# Patient Record
Sex: Female | Born: 1985 | Race: Black or African American | Hispanic: No | Marital: Single | Smoking: Never smoker
Health system: Southern US, Community
[De-identification: ages and names within clinical notes are randomized; demographics above are authoritative.]

## PROBLEM LIST (undated history)

## (undated) ENCOUNTER — Inpatient Hospital Stay (HOSPITAL_COMMUNITY): Payer: Self-pay

## (undated) DIAGNOSIS — N390 Urinary tract infection, site not specified: Secondary | ICD-10-CM

## (undated) DIAGNOSIS — N200 Calculus of kidney: Secondary | ICD-10-CM

## (undated) DIAGNOSIS — K7200 Acute and subacute hepatic failure without coma: Secondary | ICD-10-CM

## (undated) DIAGNOSIS — F419 Anxiety disorder, unspecified: Secondary | ICD-10-CM

## (undated) DIAGNOSIS — C9100 Acute lymphoblastic leukemia not having achieved remission: Secondary | ICD-10-CM

## (undated) DIAGNOSIS — B999 Unspecified infectious disease: Secondary | ICD-10-CM

## (undated) DIAGNOSIS — N83209 Unspecified ovarian cyst, unspecified side: Secondary | ICD-10-CM

## (undated) DIAGNOSIS — J189 Pneumonia, unspecified organism: Secondary | ICD-10-CM

## (undated) DIAGNOSIS — D649 Anemia, unspecified: Secondary | ICD-10-CM

## (undated) DIAGNOSIS — C9590 Leukemia, unspecified not having achieved remission: Secondary | ICD-10-CM

## (undated) DIAGNOSIS — C9200 Acute myeloblastic leukemia, not having achieved remission: Secondary | ICD-10-CM

## (undated) HISTORY — DX: Anemia, unspecified: D64.9

## (undated) HISTORY — PX: CHOLECYSTECTOMY: SHX55

## (undated) HISTORY — DX: Unspecified infectious disease: B99.9

## (undated) HISTORY — PX: OTHER SURGICAL HISTORY: SHX169

---
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
## 2005-05-05 ENCOUNTER — Emergency Department (HOSPITAL_COMMUNITY): Admission: EM | Admit: 2005-05-05 | Discharge: 2005-05-05 | Payer: Self-pay | Admitting: Emergency Medicine

## 2005-06-12 ENCOUNTER — Inpatient Hospital Stay (HOSPITAL_COMMUNITY): Admission: AD | Admit: 2005-06-12 | Discharge: 2005-06-12 | Payer: Self-pay | Admitting: Family Medicine

## 2005-06-26 DIAGNOSIS — F419 Anxiety disorder, unspecified: Secondary | ICD-10-CM

## 2005-06-26 HISTORY — DX: Anxiety disorder, unspecified: F41.9

## 2005-07-13 ENCOUNTER — Inpatient Hospital Stay (HOSPITAL_COMMUNITY): Admission: AD | Admit: 2005-07-13 | Discharge: 2005-07-13 | Payer: Self-pay | Admitting: Family Medicine

## 2005-07-15 ENCOUNTER — Inpatient Hospital Stay (HOSPITAL_COMMUNITY): Admission: AD | Admit: 2005-07-15 | Discharge: 2005-07-19 | Payer: Self-pay | Admitting: Obstetrics and Gynecology

## 2005-09-26 ENCOUNTER — Inpatient Hospital Stay (HOSPITAL_COMMUNITY): Admission: AD | Admit: 2005-09-26 | Discharge: 2005-09-26 | Payer: Self-pay | Admitting: Obstetrics

## 2005-10-08 ENCOUNTER — Inpatient Hospital Stay (HOSPITAL_COMMUNITY): Admission: AD | Admit: 2005-10-08 | Discharge: 2005-10-11 | Payer: Self-pay | Admitting: Obstetrics

## 2005-11-08 ENCOUNTER — Inpatient Hospital Stay (HOSPITAL_COMMUNITY): Admission: AD | Admit: 2005-11-08 | Discharge: 2005-11-10 | Payer: Self-pay | Admitting: Obstetrics

## 2005-11-11 ENCOUNTER — Inpatient Hospital Stay (HOSPITAL_COMMUNITY): Admission: AD | Admit: 2005-11-11 | Discharge: 2005-11-14 | Payer: Self-pay | Admitting: Obstetrics

## 2005-12-04 ENCOUNTER — Ambulatory Visit (HOSPITAL_COMMUNITY): Admission: RE | Admit: 2005-12-04 | Discharge: 2005-12-04 | Payer: Self-pay | Admitting: Obstetrics

## 2005-12-11 ENCOUNTER — Inpatient Hospital Stay (HOSPITAL_COMMUNITY): Admission: AD | Admit: 2005-12-11 | Discharge: 2005-12-14 | Payer: Self-pay | Admitting: Obstetrics

## 2005-12-11 ENCOUNTER — Inpatient Hospital Stay (HOSPITAL_COMMUNITY): Admission: AD | Admit: 2005-12-11 | Discharge: 2005-12-11 | Payer: Self-pay | Admitting: Obstetrics

## 2007-01-09 ENCOUNTER — Emergency Department (HOSPITAL_COMMUNITY): Admission: EM | Admit: 2007-01-09 | Discharge: 2007-01-09 | Payer: Self-pay | Admitting: Emergency Medicine

## 2007-02-23 ENCOUNTER — Inpatient Hospital Stay (HOSPITAL_COMMUNITY): Admission: AD | Admit: 2007-02-23 | Discharge: 2007-02-23 | Payer: Self-pay | Admitting: Obstetrics

## 2007-02-27 ENCOUNTER — Inpatient Hospital Stay (HOSPITAL_COMMUNITY): Admission: AD | Admit: 2007-02-27 | Discharge: 2007-02-27 | Payer: Self-pay | Admitting: Obstetrics

## 2007-03-02 ENCOUNTER — Inpatient Hospital Stay (HOSPITAL_COMMUNITY): Admission: AD | Admit: 2007-03-02 | Discharge: 2007-03-06 | Payer: Self-pay | Admitting: Obstetrics

## 2007-03-25 ENCOUNTER — Inpatient Hospital Stay (HOSPITAL_COMMUNITY): Admission: AD | Admit: 2007-03-25 | Discharge: 2007-03-25 | Payer: Self-pay | Admitting: Obstetrics

## 2007-05-26 ENCOUNTER — Inpatient Hospital Stay (HOSPITAL_COMMUNITY): Admission: AD | Admit: 2007-05-26 | Discharge: 2007-06-03 | Payer: Self-pay | Admitting: Obstetrics

## 2007-06-28 ENCOUNTER — Inpatient Hospital Stay (HOSPITAL_COMMUNITY): Admission: AD | Admit: 2007-06-28 | Discharge: 2007-06-28 | Payer: Self-pay | Admitting: Obstetrics

## 2007-06-30 ENCOUNTER — Inpatient Hospital Stay (HOSPITAL_COMMUNITY): Admission: AD | Admit: 2007-06-30 | Discharge: 2007-07-05 | Payer: Self-pay | Admitting: Obstetrics

## 2007-08-25 ENCOUNTER — Inpatient Hospital Stay (HOSPITAL_COMMUNITY): Admission: AD | Admit: 2007-08-25 | Discharge: 2007-08-25 | Payer: Self-pay | Admitting: Obstetrics

## 2007-08-30 ENCOUNTER — Inpatient Hospital Stay (HOSPITAL_COMMUNITY): Admission: AD | Admit: 2007-08-30 | Discharge: 2007-09-02 | Payer: Self-pay | Admitting: Obstetrics

## 2008-02-21 ENCOUNTER — Emergency Department (HOSPITAL_COMMUNITY): Admission: EM | Admit: 2008-02-21 | Discharge: 2008-02-21 | Payer: Self-pay | Admitting: Emergency Medicine

## 2008-05-12 ENCOUNTER — Emergency Department (HOSPITAL_COMMUNITY): Admission: EM | Admit: 2008-05-12 | Discharge: 2008-05-12 | Payer: Self-pay | Admitting: Emergency Medicine

## 2008-10-20 ENCOUNTER — Emergency Department (HOSPITAL_COMMUNITY): Admission: EM | Admit: 2008-10-20 | Discharge: 2008-10-20 | Payer: Self-pay | Admitting: Emergency Medicine

## 2009-01-28 ENCOUNTER — Emergency Department (HOSPITAL_COMMUNITY): Admission: EM | Admit: 2009-01-28 | Discharge: 2009-01-28 | Payer: Self-pay | Admitting: Emergency Medicine

## 2009-09-28 ENCOUNTER — Inpatient Hospital Stay (HOSPITAL_COMMUNITY): Admission: AD | Admit: 2009-09-28 | Discharge: 2009-09-28 | Payer: Self-pay | Admitting: Obstetrics & Gynecology

## 2009-10-21 ENCOUNTER — Inpatient Hospital Stay (HOSPITAL_COMMUNITY): Admission: AD | Admit: 2009-10-21 | Discharge: 2009-10-22 | Payer: Self-pay | Admitting: Obstetrics

## 2009-10-24 ENCOUNTER — Emergency Department (HOSPITAL_COMMUNITY): Admission: EM | Admit: 2009-10-24 | Discharge: 2009-10-24 | Payer: Self-pay | Admitting: Emergency Medicine

## 2009-12-17 ENCOUNTER — Inpatient Hospital Stay (HOSPITAL_COMMUNITY): Admission: AD | Admit: 2009-12-17 | Discharge: 2009-12-20 | Payer: Self-pay | Admitting: Obstetrics & Gynecology

## 2009-12-17 ENCOUNTER — Ambulatory Visit: Payer: Self-pay | Admitting: Obstetrics and Gynecology

## 2009-12-26 ENCOUNTER — Inpatient Hospital Stay (HOSPITAL_COMMUNITY): Admission: AD | Admit: 2009-12-26 | Discharge: 2009-12-26 | Payer: Self-pay | Admitting: Obstetrics

## 2009-12-26 ENCOUNTER — Ambulatory Visit: Payer: Self-pay | Admitting: Nurse Practitioner

## 2010-02-13 ENCOUNTER — Emergency Department (HOSPITAL_COMMUNITY): Admission: EM | Admit: 2010-02-13 | Discharge: 2010-02-13 | Payer: Self-pay | Admitting: Family Medicine

## 2010-04-11 ENCOUNTER — Inpatient Hospital Stay (HOSPITAL_COMMUNITY): Admission: AD | Admit: 2010-04-11 | Discharge: 2010-04-11 | Payer: Self-pay | Admitting: Obstetrics and Gynecology

## 2010-04-11 ENCOUNTER — Ambulatory Visit: Payer: Self-pay | Admitting: Nurse Practitioner

## 2010-06-19 ENCOUNTER — Inpatient Hospital Stay (HOSPITAL_COMMUNITY)
Admission: AD | Admit: 2010-06-19 | Discharge: 2010-06-19 | Payer: Self-pay | Source: Home / Self Care | Attending: Obstetrics | Admitting: Obstetrics

## 2010-06-26 HISTORY — PX: OTHER SURGICAL HISTORY: SHX169

## 2010-07-05 ENCOUNTER — Inpatient Hospital Stay (HOSPITAL_COMMUNITY)
Admission: AD | Admit: 2010-07-05 | Discharge: 2010-07-09 | Payer: Self-pay | Source: Home / Self Care | Attending: Obstetrics | Admitting: Obstetrics

## 2010-07-11 LAB — CBC
HCT: 26.5 % — ABNORMAL LOW (ref 36.0–46.0)
HCT: 24.9 % — ABNORMAL LOW (ref 36.0–46.0)
Hemoglobin: 8.5 g/dL — ABNORMAL LOW (ref 12.0–15.0)
Hemoglobin: 8 g/dL — ABNORMAL LOW (ref 12.0–15.0)
MCH: 24 pg — ABNORMAL LOW (ref 26.0–34.0)
MCH: 24.1 pg — ABNORMAL LOW (ref 26.0–34.0)
MCHC: 32.1 g/dL (ref 30.0–36.0)
MCV: 74.9 fL — ABNORMAL LOW (ref 78.0–100.0)
MCV: 75 fL — ABNORMAL LOW (ref 78.0–100.0)
Platelets: 437 10*3/uL — ABNORMAL HIGH (ref 150–400)
Platelets: 447 10*3/uL — ABNORMAL HIGH (ref 150–400)
RBC: 3.54 MIL/uL — ABNORMAL LOW (ref 3.87–5.11)
RDW: 18.7 % — ABNORMAL HIGH (ref 11.5–15.5)
RDW: 18.7 % — ABNORMAL HIGH (ref 11.5–15.5)
WBC: 9.3 10*3/uL (ref 4.0–10.5)
WBC: 9.3 10*3/uL (ref 4.0–10.5)

## 2010-07-11 LAB — DIFFERENTIAL
Basophils Absolute: 0 10*3/uL (ref 0.0–0.1)
Basophils Absolute: 0 10*3/uL (ref 0.0–0.1)
Basophils Relative: 0 % (ref 0–1)
Basophils Relative: 0 % (ref 0–1)
Eosinophils Absolute: 0 10*3/uL (ref 0.0–0.7)
Eosinophils Absolute: 0 10*3/uL (ref 0.0–0.7)
Eosinophils Relative: 0 % (ref 0–5)
Eosinophils Relative: 0 % (ref 0–5)
Lymphocytes Relative: 10 % — ABNORMAL LOW (ref 12–46)
Lymphocytes Relative: 7 % — ABNORMAL LOW (ref 12–46)
Lymphs Abs: 0.9 10*3/uL (ref 0.7–4.0)
Lymphs Abs: 0.7 10*3/uL (ref 0.7–4.0)
Monocytes Absolute: 0.3 10*3/uL (ref 0.1–1.0)
Monocytes Absolute: 0 10*3/uL — ABNORMAL LOW (ref 0.1–1.0)
Monocytes Relative: 4 % (ref 3–12)
Monocytes Relative: 0 % — ABNORMAL LOW (ref 3–12)
Neutro Abs: 8.1 10*3/uL — ABNORMAL HIGH (ref 1.7–7.7)
Neutro Abs: 8.5 10*3/uL — ABNORMAL HIGH (ref 1.7–7.7)
Neutrophils Relative %: 86 % — ABNORMAL HIGH (ref 43–77)
Neutrophils Relative %: 92 % — ABNORMAL HIGH (ref 43–77)

## 2010-07-11 LAB — RUBELLA SCREEN: Rubella: 34.1 IU/mL — ABNORMAL HIGH

## 2010-07-11 LAB — ELECTROLYTE PANEL
CO2: 23 mEq/L (ref 19–32)
Chloride: 106 mEq/L (ref 96–112)
Potassium: 3.4 mEq/L — ABNORMAL LOW (ref 3.5–5.1)
Sodium: 134 mEq/L — ABNORMAL LOW (ref 135–145)

## 2010-07-11 LAB — RUBELLA ANTIBODY, IGM: Rubella IgM: 0.63 Ratio (ref <0.90)

## 2010-07-11 LAB — RPR
RPR Ser Ql: NONREACTIVE
RPR Ser Ql: NONREACTIVE

## 2010-07-11 LAB — ABO/RH: ABO/RH(D): A POS

## 2010-07-11 LAB — GLUCOSE, CAPILLARY: Glucose-Capillary: 86 mg/dL (ref 70–99)

## 2010-07-11 LAB — SICKLE CELL SCREEN: Sickle Cell Screen: NEGATIVE

## 2010-07-11 LAB — HEPATITIS B SURFACE ANTIBODY,QUALITATIVE: Hep B S Ab: POSITIVE — AB

## 2010-07-11 LAB — HIV ANTIBODY (ROUTINE TESTING W REFLEX): HIV: NONREACTIVE

## 2010-07-13 LAB — GLUCOSE, CAPILLARY
Glucose-Capillary: 105 mg/dL — ABNORMAL HIGH (ref 70–99)
Glucose-Capillary: 117 mg/dL — ABNORMAL HIGH (ref 70–99)

## 2010-07-17 ENCOUNTER — Encounter: Payer: Self-pay | Admitting: Obstetrics

## 2010-07-18 ENCOUNTER — Inpatient Hospital Stay (HOSPITAL_COMMUNITY)
Admission: AD | Admit: 2010-07-18 | Discharge: 2010-07-18 | Payer: Self-pay | Source: Home / Self Care | Attending: Obstetrics | Admitting: Obstetrics

## 2010-09-05 LAB — URINALYSIS, ROUTINE W REFLEX MICROSCOPIC
Nitrite: NEGATIVE
Specific Gravity, Urine: 1.02 (ref 1.005–1.030)
Urobilinogen, UA: 1 mg/dL (ref 0.0–1.0)
pH: 7.5 (ref 5.0–8.0)

## 2010-09-05 LAB — URINE MICROSCOPIC-ADD ON

## 2010-09-07 LAB — URINE CULTURE: Culture  Setup Time: 201110171953

## 2010-09-07 LAB — URINE MICROSCOPIC-ADD ON

## 2010-09-07 LAB — CBC
HCT: 24 % — ABNORMAL LOW (ref 36.0–46.0)
Hemoglobin: 7.6 g/dL — ABNORMAL LOW (ref 12.0–15.0)
RBC: 3.27 MIL/uL — ABNORMAL LOW (ref 3.87–5.11)
WBC: 5.1 10*3/uL (ref 4.0–10.5)

## 2010-09-07 LAB — ABO/RH: ABO/RH(D): A POS

## 2010-09-07 LAB — URINALYSIS, ROUTINE W REFLEX MICROSCOPIC
Bilirubin Urine: NEGATIVE
Hgb urine dipstick: NEGATIVE
Ketones, ur: NEGATIVE mg/dL
Protein, ur: NEGATIVE mg/dL
Urobilinogen, UA: 1 mg/dL (ref 0.0–1.0)

## 2010-09-07 LAB — WET PREP, GENITAL: Trich, Wet Prep: NONE SEEN

## 2010-09-07 LAB — GC/CHLAMYDIA PROBE AMP, GENITAL: Chlamydia, DNA Probe: NEGATIVE

## 2010-09-08 LAB — POCT URINALYSIS DIPSTICK
Ketones, ur: NEGATIVE mg/dL
Protein, ur: NEGATIVE mg/dL
Specific Gravity, Urine: 1.025 (ref 1.005–1.030)
Urobilinogen, UA: 0.2 mg/dL (ref 0.0–1.0)

## 2010-09-11 LAB — CBC
HCT: 28.5 % — ABNORMAL LOW (ref 36.0–46.0)
Hemoglobin: 9.4 g/dL — ABNORMAL LOW (ref 12.0–15.0)
Hemoglobin: 9.4 g/dL — ABNORMAL LOW (ref 12.0–15.0)
MCH: 25.9 pg — ABNORMAL LOW (ref 26.0–34.0)
MCHC: 32.9 g/dL (ref 30.0–36.0)
MCV: 78.9 fL (ref 78.0–100.0)
MCV: 78.9 fL (ref 78.0–100.0)
RBC: 3.65 MIL/uL — ABNORMAL LOW (ref 3.87–5.11)
RDW: 18.5 % — ABNORMAL HIGH (ref 11.5–15.5)
WBC: 12.8 10*3/uL — ABNORMAL HIGH (ref 4.0–10.5)
WBC: 11.3 10*3/uL — ABNORMAL HIGH (ref 4.0–10.5)

## 2010-09-11 LAB — COMPREHENSIVE METABOLIC PANEL
ALT: 11 U/L (ref 0–35)
ALT: 35 U/L (ref 0–35)
Alkaline Phosphatase: 51 U/L (ref 39–117)
BUN: 33 mg/dL — ABNORMAL HIGH (ref 6–23)
BUN: 5 mg/dL — ABNORMAL LOW (ref 6–23)
CO2: 22 mEq/L (ref 19–32)
CO2: 22 mEq/L (ref 19–32)
Calcium: 8.4 mg/dL (ref 8.4–10.5)
Creatinine, Ser: 0.48 mg/dL (ref 0.4–1.2)
GFR calc non Af Amer: 44 mL/min — ABNORMAL LOW (ref >60)
GFR calc non Af Amer: >60 mL/min (ref >60)
Glucose, Bld: 129 mg/dL — ABNORMAL HIGH (ref 70–99)
Glucose, Bld: 84 mg/dL (ref 70–99)
Potassium: 5.1 mEq/L (ref 3.5–5.1)
Sodium: 140 mEq/L (ref 135–145)
Total Protein: 6.6 g/dL (ref 6.0–8.3)
Total Protein: 9.3 g/dL — ABNORMAL HIGH (ref 6.0–8.3)

## 2010-09-11 LAB — URINE MICROSCOPIC-ADD ON

## 2010-09-11 LAB — URINALYSIS, ROUTINE W REFLEX MICROSCOPIC
Glucose, UA: NEGATIVE mg/dL
Ketones, ur: >80 mg/dL — AB
Leukocytes, UA: NEGATIVE
Nitrite: NEGATIVE
Protein, ur: 30 mg/dL — AB
Urobilinogen, UA: 0.2 mg/dL (ref 0.0–1.0)

## 2010-09-13 LAB — URINALYSIS, ROUTINE W REFLEX MICROSCOPIC
Glucose, UA: NEGATIVE mg/dL
Ketones, ur: >80 mg/dL — AB
Leukocytes, UA: NEGATIVE
Nitrite: NEGATIVE
Protein, ur: 30 mg/dL — AB
pH: 6 (ref 5.0–8.0)

## 2010-09-13 LAB — POCT I-STAT, CHEM 8
BUN: 14 mg/dL (ref 6–23)
Creatinine, Ser: 0.8 mg/dL (ref 0.4–1.2)
Potassium: 3.5 mEq/L (ref 3.5–5.1)
Sodium: 139 mEq/L (ref 135–145)
TCO2: 22 mmol/L (ref 0–100)

## 2010-09-13 LAB — COMPREHENSIVE METABOLIC PANEL
ALT: 21 U/L (ref 0–35)
AST: 26 U/L (ref 0–37)
Alkaline Phosphatase: 32 U/L — ABNORMAL LOW (ref 39–117)
CO2: 23 mEq/L (ref 19–32)
Chloride: 108 mEq/L (ref 96–112)
GFR calc Af Amer: >60 mL/min (ref >60)
GFR calc non Af Amer: >60 mL/min (ref >60)
Glucose, Bld: 95 mg/dL (ref 70–99)
Sodium: 138 mEq/L (ref 135–145)
Total Bilirubin: 0.6 mg/dL (ref 0.3–1.2)

## 2010-09-13 LAB — URINE MICROSCOPIC-ADD ON

## 2010-09-13 LAB — CBC
Hemoglobin: 8.3 g/dL — ABNORMAL LOW (ref 12.0–15.0)
RBC: 3.25 MIL/uL — ABNORMAL LOW (ref 3.87–5.11)
WBC: 6.9 10*3/uL (ref 4.0–10.5)

## 2010-09-13 LAB — WET PREP, GENITAL
Clue Cells Wet Prep HPF POC: NONE SEEN
Trich, Wet Prep: NONE SEEN

## 2010-09-13 LAB — PREGNANCY, URINE: Preg Test, Ur: NEGATIVE

## 2010-09-14 LAB — URINALYSIS, ROUTINE W REFLEX MICROSCOPIC
Bilirubin Urine: NEGATIVE
Hgb urine dipstick: NEGATIVE
Ketones, ur: 15 mg/dL — AB
Protein, ur: NEGATIVE mg/dL
Specific Gravity, Urine: 1.02 (ref 1.005–1.030)
Urobilinogen, UA: 0.2 mg/dL (ref 0.0–1.0)

## 2010-09-25 ENCOUNTER — Inpatient Hospital Stay (HOSPITAL_COMMUNITY)
Admission: AD | Admit: 2010-09-25 | Discharge: 2010-09-25 | Disposition: A | Discharge status: Home / Self Care | Payer: Medicaid Other | Source: Ambulatory Visit | Attending: Obstetrics & Gynecology | Admitting: Obstetrics & Gynecology

## 2010-09-25 DIAGNOSIS — R109 Unspecified abdominal pain: Secondary | ICD-10-CM | POA: Insufficient documentation

## 2010-09-25 DIAGNOSIS — D649 Anemia, unspecified: Secondary | ICD-10-CM | POA: Insufficient documentation

## 2010-09-25 DIAGNOSIS — O212 Late vomiting of pregnancy: Secondary | ICD-10-CM | POA: Insufficient documentation

## 2010-09-25 DIAGNOSIS — O99019 Anemia complicating pregnancy, unspecified trimester: Secondary | ICD-10-CM | POA: Insufficient documentation

## 2010-09-25 LAB — RAPID URINE DRUG SCREEN, HOSP PERFORMED
Benzodiazepines: NOT DETECTED
Cocaine: NOT DETECTED
Tetrahydrocannabinol: NOT DETECTED

## 2010-09-25 LAB — TYPE AND SCREEN

## 2010-09-25 LAB — URINE MICROSCOPIC-ADD ON

## 2010-09-25 LAB — URINALYSIS, ROUTINE W REFLEX MICROSCOPIC
Glucose, UA: NEGATIVE mg/dL
Protein, ur: NEGATIVE mg/dL
Specific Gravity, Urine: 1.025 (ref 1.005–1.030)
pH: 6 (ref 5.0–8.0)

## 2010-09-25 LAB — CBC
MCV: 76.8 fL — ABNORMAL LOW (ref 78.0–100.0)
Platelets: 211 10*3/uL (ref 150–400)
RDW: 16.7 % — ABNORMAL HIGH (ref 11.5–15.5)
WBC: 5 10*3/uL (ref 4.0–10.5)

## 2010-09-25 LAB — DIFFERENTIAL
Basophils Relative: 1 % (ref 0–1)
Eosinophils Absolute: 0 10*3/uL (ref 0.0–0.7)
Eosinophils Relative: 1 % (ref 0–5)
Lymphs Abs: 1 10*3/uL (ref 0.7–4.0)

## 2010-09-25 LAB — RUBELLA SCREEN: Rubella: 34.9 IU/mL — ABNORMAL HIGH

## 2010-09-26 LAB — RPR: RPR Ser Ql: NONREACTIVE

## 2010-10-19 ENCOUNTER — Other Ambulatory Visit: Payer: Self-pay | Admitting: Family Medicine

## 2010-10-19 ENCOUNTER — Other Ambulatory Visit: Payer: Self-pay | Admitting: Obstetrics & Gynecology

## 2010-10-19 DIAGNOSIS — O0930 Supervision of pregnancy with insufficient antenatal care, unspecified trimester: Secondary | ICD-10-CM

## 2010-10-19 DIAGNOSIS — Z331 Pregnant state, incidental: Secondary | ICD-10-CM

## 2010-10-19 DIAGNOSIS — Z3689 Encounter for other specified antenatal screening: Secondary | ICD-10-CM

## 2010-10-19 LAB — POCT URINALYSIS DIP (DEVICE)
Ketones, ur: NEGATIVE mg/dL
Protein, ur: NEGATIVE mg/dL
Specific Gravity, Urine: 1.015 (ref 1.005–1.030)
pH: 7 (ref 5.0–8.0)

## 2010-10-21 ENCOUNTER — Ambulatory Visit (HOSPITAL_COMMUNITY)
Admission: RE | Admit: 2010-10-21 | Discharge: 2010-10-21 | Disposition: A | Discharge status: Home / Self Care | Payer: Medicaid Other | Source: Ambulatory Visit | Attending: Obstetrics & Gynecology | Admitting: Obstetrics & Gynecology

## 2010-10-21 DIAGNOSIS — O0930 Supervision of pregnancy with insufficient antenatal care, unspecified trimester: Secondary | ICD-10-CM

## 2010-10-21 DIAGNOSIS — Z3689 Encounter for other specified antenatal screening: Secondary | ICD-10-CM

## 2010-10-30 ENCOUNTER — Inpatient Hospital Stay (HOSPITAL_COMMUNITY)
Admission: AD | Admit: 2010-10-30 | Discharge: 2010-10-30 | Disposition: A | Discharge status: Home / Self Care | Payer: Medicaid Other | Source: Ambulatory Visit | Attending: Obstetrics & Gynecology | Admitting: Obstetrics & Gynecology

## 2010-10-30 DIAGNOSIS — O4700 False labor before 37 completed weeks of gestation, unspecified trimester: Secondary | ICD-10-CM

## 2010-10-30 DIAGNOSIS — R109 Unspecified abdominal pain: Secondary | ICD-10-CM | POA: Insufficient documentation

## 2010-10-30 LAB — URINALYSIS, ROUTINE W REFLEX MICROSCOPIC
Glucose, UA: NEGATIVE mg/dL
Ketones, ur: 15 mg/dL — AB
pH: 7 (ref 5.0–8.0)

## 2010-11-02 ENCOUNTER — Other Ambulatory Visit: Payer: Self-pay | Admitting: Obstetrics & Gynecology

## 2010-11-02 DIAGNOSIS — O0930 Supervision of pregnancy with insufficient antenatal care, unspecified trimester: Secondary | ICD-10-CM

## 2010-11-02 DIAGNOSIS — Z331 Pregnant state, incidental: Secondary | ICD-10-CM

## 2010-11-02 DIAGNOSIS — O88019 Air embolism in pregnancy, unspecified trimester: Secondary | ICD-10-CM

## 2010-11-02 DIAGNOSIS — O99019 Anemia complicating pregnancy, unspecified trimester: Secondary | ICD-10-CM

## 2010-11-02 LAB — POCT URINALYSIS DIP (DEVICE)
Protein, ur: NEGATIVE mg/dL
Specific Gravity, Urine: 1.02 (ref 1.005–1.030)
pH: 7 (ref 5.0–8.0)

## 2010-11-08 NOTE — H&P (Signed)
NAMEDORRI, OZTURK NO.:  [id number]   MEDICAL RECORD NO.:  [id number]          PATIENT TYPE:  INP   LOCATION:  9153                          [facility]ACILITY:  [facility]   PHYSICIAN:  Charles A. Clearance Coots, M.D.DATE OF BIRTH:  1986-04-09   DATE OF ADMISSION:  05/26/2007  DATE OF DISCHARGE:                              HISTORY & PHYSICAL   HISTORY AND PHYSICAL:  A 25 year old G2, P74, female presented to [facility] at [redacted] weeks gestation with nausea, vomiting, and inability to  keep any thing down.  She denied fever, chills, dysuria, diarrhea, or  uterine contractions.   PAST MEDICAL HISTORY:   SURGERY:  None.   ILLNESSES:  None.   MEDICATIONS:  None.   ALLERGIES:  No known drug allergies.   SOCIAL HISTORY:  Single.  Negative tobacco, alcohol, or recreational  drug use.   PHYSICAL EXAMINATION:  GENERAL:  Well-nourished, well-developed female  in no acute distress.  VITAL SIGNS:  Temperature 97, blood pressure 114/59.  LUNGS:  Clear to auscultation bilaterally.  HEART:  Regular rate and rhythm.  ABDOMEN:  Nontender.  PELVIC:  Exam was omitted.   IMPRESSION:  1. At [redacted] weeks gestation.  2. Probable viral gastroenteritis.   PLAN:  1. Admit.  2. Start IV fluid hydration.  3. Supportive management.      Charles A. Clearance Coots, M.D.  Electronically Signed    CAH/MEDQ  D:  05/26/2007  T:  05/26/2007  Job:  [id number]

## 2010-11-08 NOTE — H&P (Signed)
Carla, Haas NO.:  [id number]   MEDICAL RECORD NO.:  [id number]          PATIENT TYPE:  INP   LOCATION:  9121                          [facility]  [facility]   PHYSICIAN:  Roseanna Rainbow, M.D.DATE OF BIRTH:  Dec 14, 1985   DATE OF ADMISSION:  08/30/2007  DATE OF DISCHARGE:                              HISTORY & PHYSICAL   CHIEF COMPLAINT:  The patient is a 25 year old, para 1, with an  intrauterine pregnancy at 39+ weeks, complaining of uterine  contractions.   HISTORY OF PRESENT ILLNESS:  Please see the above.  Denies ruptured  membranes.   PAST GYNECOLOGIC HISTORY:  Noncontributory.   MEDICATIONS:  None.   ALLERGIES:  No known drug allergies.   PAST OBSTETRICAL HISTORY:  1. In 2007, she was delivered of a liveborn female, 5 pounds, 3      ounces, by spontaneous vaginal delivery.  2. Hyperemesis gravidarum early in the pregnancy.  3. Suspected CBM.  4. Positive gonorrhea __________ .  5. GBS positive.  6. Urinary tract infection.   __________ presently intrauterine growth retardation.   PAST SURGICAL HISTORY:  Cholecystectomy.   PAST MEDICAL HISTORY:  1. Anxiety disorder.  2. Urinary tract infections.   PRENATAL LABORATORY DATA:  Hemoglobin 8.1, hematocrit 25.3, platelets  283,000, blood type A-positive, antibody screen negative, sickle cell  negative, RPR nonreactive, Rubella immune, hepatitis B surface antigen  negative, PPD negative, GC probe positive, Chlamydia negative, cystic  fibrosis assay negative, GBS positive, repeat Chlamydia probe negative,  HIV nonreactive.   PHYSICAL EXAMINATION:  Vital signs stable, afebrile.  Fetal heart rate  tracing per tocodynamometer, the uterine contractions every two to four  minutes.  A sterile vaginal exam per the RN, the cervix is 4 cm dilated  and 80% effaced.   ASSESSMENT:  1. Primipara at term with possible intrauterine growth retardation.  2. Group B Streptococcus positive.  3. Latent,  not necessarily in active labor.  4. Fetal heart tracing is __________ well-being.   PLAN:  Admit.  Expected management for now.      Roseanna Rainbow, M.D.     Carla Haas  D:  08/31/2007  T:  08/31/2007  Job:  [id number]

## 2010-11-08 NOTE — Consult Note (Signed)
NAME:  Carla Haas, Carla Haas NO.:  [id number]   MEDICAL RECORD NO.:  [id number]          PATIENT TYPE:  INP   LOCATION:  9153                          [facility]  [facility]   PHYSICIAN:  Graylin Shiver, M.D.   DATE OF BIRTH:  09-24-1985   DATE OF CONSULTATION:  05/31/2007  DATE OF DISCHARGE:                                 CONSULTATION   REASON FOR CONSULTATION:  The patient is a 25 year old black female who  is [redacted] weeks pregnant.  She has been experiencing nausea and vomiting for  the past week and a half.  Actually today she has tolerated some food  and is feeling better according to her and also the nurse reports that  she looks and feels a lot better than when she saw her in the past.  The  patient has not been experiencing any abdominal pain.   She is status post cholecystectomy in 2003 for gallstones.  On May 29, 2007, her amylase was 173, upper limit of normal 131.  Her lipase was  normal and her LFTs were normal.   She has been treated this hospitalization with Zofran and today is  feeling much, much better.   PHYSICAL EXAMINATION:  She is in no distress.  Vital signs are stable.  She is alert.  No distress.  Heart regular rhythm.  No murmurs.  Lungs  are clear.  Abdomen is soft.  She is pregnant.  It is not tender.   IMPRESSION:  1. Nausea and vomiting.  I wonder if this might be a viral syndrome.      I would also have to consider hyperemesis but she seems to be      better today.  2. Elevated amylase with a normal lipase and no abdominal pain.  This      does not seem to be suggestive of pancreatitis.   RECOMMENDATIONS:  I would recommend rechecking an amylase and lipase,  continuing supportive care with IV fluids, Zofran and TNA if necessary.  Will also check an abdominal ultrasound to look for any radiological  evidence of pancreatitis or dilated biliary tree.           ______________________________  Graylin Shiver, M.D.     SFG/MEDQ  D:   05/31/2007  T:  06/01/2007  Job:  [id number]

## 2010-11-08 NOTE — Consult Note (Signed)
NAMELAQUASIA, PINCUS NO.:  [id number]   MEDICAL RECORD NO.:  [id number]          PATIENT TYPE:  INP   LOCATION:  9153                          [facility]   PHYSICIAN:  Antonietta Breach, M.D.  DATE OF BIRTH:  06-05-1986   DATE OF CONSULTATION:  05/29/2007  DATE OF DISCHARGE:                                 CONSULTATION   REASON FOR CONSULTATION:  Rule out psychosis.  Rule out depression.   HISTORY OF PRESENT ILLNESS:  Ms. Gambrell is a 25 year old female  admitted to the [facility] [place] on May 26, 2007 with  non remitting nausea and vomiting.   The patient has two days of developing a posture in her bed which  involves the fetal position covering up with covers and not talking.  As  the day has proceeded today, the patient has begun to answer questions  with the current nurse on duty.  She also has been able to keep small  amounts of food down.  However, the severe nausea continues.  The  patient has continued with poverty of speech.  She has not verbalized  any thoughts of harming herself or others.  She has not given any  evidence that she is having delusions.  She has not accused anyone of  poisoning her or trying to harm her.   The patient has been requiring 0.5 mg of Xanax three times a day to  reduce feeling on edge and muscle tension.  This appears to be working  well.   At the time of the undersigned's visit, the patient describes normal  future constructive goals and interests.  She has hope.  She denies  depressed mood.  She denies thoughts of harming herself or others.  She  is not having any hallucinations or delusions.  Please see the mental  status exam.   The patient states that she has utilized this posturing to help her deal  with the discomfort.  When she goes into the state of withdrawal, she is  fully aware and oriented.  She has intact memory.  The state of  withdrawing inward helps her to perceive the discomfort at  a lower  level.   PAST PSYCHIATRIC HISTORY:  Ms. Budreau denies any history of major  depression or hallucinations.  She denies any psychiatric care.  She  denies any history of suicidal thoughts.   She states that she utilized this method of trance-like posturing to  deal with her previous hyperemesis of her first pregnancy.   FAMILY PSYCHIATRIC HISTORY:  None known.   SOCIAL HISTORY:  Ms. Alicia does have a toddler from a previous  pregnancy.  The father of the current baby is living with Ms. Noguchi.  They are a mutually supportive couple.  There is no abuse.  Her other  child lives with them.  The patient denies any history of alcohol or  illegal drug use.   PAST MEDICAL HISTORY:  Hyperemesis gravidarum.   ALLERGIES:  No known drug allergies.   MEDICATIONS:  The MAR is reviewed.  The patient's psychotropics include  Xanax 0.5 mg t.i.d.  and Ambien 10 mg every evening p.r.n. insomnia.   LABORATORY DATA:  Sodium 132, BUN 8, creatinine 0.58, calcium 8.4, SGOT  27, SGPT 21.  Wet prep negative for Trichomonas and negative for clue  cells.  WBC 13.4, hemoglobin 10.8, platelet count 384.   REVIEW OF SYSTEMS:  CONSTITUTIONAL:  Afebrile.  HEAD:  No trauma.  EYES:  No visual changes.  EARS:  No hearing impairment.  NOSE:  No rhinorrhea.  MOUTH/THROAT:  No sore throat.  NEUROLOGIC:  No focal motor or sensory  deficit.  PSYCHIATRIC:  As above.  CARDIOVASCULAR:  No chest pain or  palpitations.  RESPIRATORY:  No coughing or wheezing.  GASTROINTESTINAL:  As above.  GENITOURINARY:  No dysuria.  SKIN:  Unremarkable.  ENDOCRINE/METABOLIC:  No heat or cold intolerance.  MUSCULOSKELETAL:  No  deformities.  HEMATOLOGIC/LYMPHATIC:  Anemia.   PHYSICAL EXAMINATION:  VITAL SIGNS:  Temperature 98.9, pulse 85,  respiratory rate 20, blood pressure 99/63.  GENERAL APPEARANCE:  Ms. Pincock is a young female appearing her  chronologic age initially lying in a fetal position in the left lateral   decubitus position and eventually stretching out at times all of her  extremities.  She does not have any abnormal involuntary movements.   MENTAL STATUS EXAM:  Ms. Amesquita during the initial part of the  interview particularly does not have eye contact.  She began to have  some eye contact, but would go back to closing her eyes with her head  down in the bed intermittently throughout the interview.  She is  attentive to the conversation clearly responding quickly to questions.  As the interview progresses, the patient makes spontaneous statements  and questions.  She is oriented completely to all spheres.  Her memory  is intact to immediate, recent and remote.  Her fund of knowledge and  intelligence are within normal limits.  Her affect is slightly anxious.  Her mood is slightly anxious.  Her speech involves normal rate and  prosody without dysarthria.  Her speech volume is soft.  Initially she  has poverty of speech, but as the interview progresses, see above.  Thought process logical, coherent, goal-directed.  No looseness of  associations.  Thought content:  No thoughts of harming herself, no  thoughts of harming others, no delusions, no hallucinations.  Language  expression and comprehension are intact.  Abstraction intact.  Insight  is intact.  Judgment within normal limits.   ASSESSMENT:  AXIS I:  293.84 anxiety disorder not otherwise specified.  AXIS II:  Deferred.  AXIS III:  See general medical section.  AXIS IV:  General medical.  AXIS V:  55.   Ms. Zartman is not at risk to harm herself or others.  She agrees to  call emergency services immediately for any thoughts of harming herself,  thoughts of harming others or distress.   The undersigned provided ego supportive psychotherapy and education  discussing the signs and symptoms of depression.  The patient agrees to  tell her doctor if she develops these symptoms.   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  The patient is completely aware and  alert as well as oriented.  She is utilizing this posture to reduce her  perception and cope with the discomfort.   RECOMMENDATIONS:  1. If the patient continues to require Xanax, would refer her to one  of the psychiatric clinics attached to [facility], [facility] or [facility].  Also if she      requires Xanax outside of this episode of general medical stress,      she would be a candidate for cognitive behavioral therapy combined      with progressive muscle relaxation and deep breathing as well as      possibly other pharmacotherapy.      Antonietta Breach, M.D.  Electronically Signed     JW/MEDQ  D:  05/29/2007  T:  05/30/2007  Job:  [id number]

## 2010-11-08 NOTE — Discharge Summary (Signed)
Carla Haas, STRATTON NO.:  [id number]   MEDICAL RECORD NO.:  [id number]          PATIENT TYPE:  INP   LOCATION:  9153                          [facility]   PHYSICIAN:  Kathreen Cosier, M.D.DATE OF BIRTH:  May 30, 1986   DATE OF ADMISSION:  05/26/2007  DATE OF DISCHARGE:  06/03/2007                               DISCHARGE SUMMARY   The patient is a 25 year old gravida 2, para 1-0-0-1, EDC September 02, 2007,  and she was admitted with persistent nausea, vomiting.  The patient also  complained of anxiety.  The patient had developed problems with her  previous pregnancy, about the same time.  She was started on Xanax 8.5  t.i.d.  Her potassium was 2.4, and she was placed on 40 mEq of KCl  IV.  Psychiatric consult was also obtained, and a GI consult was obtained.  Her SMA 24 was normal.  Amylase, lipase, thyroid function was  all  normal, and by December 4 she was more alert, and she had been seen by  the perinatologist and the psychiatrist.  She was started on Ensure 4  times a day, and the patient started eating spontaneously, and by  December 8, she had had no vomiting for 2 days, and she was discharged  home on Zofran 4 mg p.o. q.6 h., to be followed at the outpatient mental  health department and to see me in 2 weeks.   DISCHARGE DIAGNOSIS:  Status post nausea, vomiting at 25 weeks.           ______________________________  Kathreen Cosier, M.D.     BAM/MEDQ  D:  06/26/2007  T:  06/26/2007  Job:  [id number]

## 2010-11-09 ENCOUNTER — Other Ambulatory Visit: Payer: Self-pay | Admitting: Family Medicine

## 2010-11-09 DIAGNOSIS — O0930 Supervision of pregnancy with insufficient antenatal care, unspecified trimester: Secondary | ICD-10-CM

## 2010-11-09 DIAGNOSIS — Z331 Pregnant state, incidental: Secondary | ICD-10-CM

## 2010-11-09 DIAGNOSIS — O99019 Anemia complicating pregnancy, unspecified trimester: Secondary | ICD-10-CM

## 2010-11-09 LAB — POCT URINALYSIS DIP (DEVICE)
Hgb urine dipstick: NEGATIVE
Protein, ur: NEGATIVE mg/dL
Specific Gravity, Urine: 1.02 (ref 1.005–1.030)
Urobilinogen, UA: 1 mg/dL (ref 0.0–1.0)

## 2010-11-11 NOTE — H&P (Signed)
NAMEIXEL, BOEHNING NO.:  [id number]   MEDICAL RECORD NO.:  [id number]          PATIENT TYPE:  INP   LOCATION:  9121                          [facility]   PHYSICIAN:  Roseanna Rainbow, M.D.DATE OF BIRTH:  04-03-1986   DATE OF ADMISSION:  08/30/2007  DATE OF DISCHARGE:  09/02/2007                              HISTORY & PHYSICAL   CHIEF COMPLAINT:  The patient is a 25 year old para 1 with an  intrauterine pregnancy at 39+ weeks complaining of uterine contractions.   HISTORY OF PRESENT ILLNESS:  Please see the above.  She denies ruptured  membranes.   PAST GYNECOLOGIC HISTORY:  Noncontributory.   MEDICATIONS:  None.   ALLERGIES:  NO KNOWN DRUG ALLERGIES.   OB RISK FACTORS:  Poor nutrition, anxiety disorder, hyperemesis  gravidarum, poor social situation, positive gonorrhea and DNA probes,  urinary tract infections, GBS positive, iron-deficiency anemia, limited  prenatal care.   PRENATAL LABS:  Hemoglobin 8.1, hematocrit 25.3, platelets 283,000.  Blood type A+, antibody screen negative, sickle cell trait negative.  RPR nonreactive, rubella immune, hepatitis B surface antigen negative,  HIV nonreactive, PPD negative, GC probe positive, chlamydia probe  negative.  Cystic fibrosis negative for the mutations tested, 1-hour GTT  87, HIV nonreactive, GBS positive.   PAST OB HISTORY:  In 2007 she delivered a live-born female 5 pounds 3  ounces by spontaneous vaginal delivery with no complications.   PAST MEDICAL HISTORY:  Please see the above.   PAST SURGICAL HISTORY:  Cholecystectomy.   PHYSICAL EXAM:  Vital signs stable, afebrile.  Fetal heart tracing reassuring.  Tocodynamometer:  Uterine contractions every 2-4 minutes.  On sterile  vaginal exam performed by the RN, cervix 4 cm dilated and 80% effaced.   ASSESSMENT:  Primipara at term with possible intrauterine growth  restriction, Group B streptococcus positive, latent versus early active  labor.  Fetal heart tracing is consistent with fetal well-being.   PLAN:  Admission.  Expectant management for now.      Roseanna Rainbow, M.D.  Electronically Signed     LAJ/MEDQ  D:  10/19/2007  T:  10/19/2007  Job:  [id number]

## 2010-11-11 NOTE — Discharge Summary (Signed)
NAMETECIA, CINNAMON NO.:  [id number]   MEDICAL RECORD NO.:  [id number]          PATIENT TYPE:  INP   LOCATION:  9156                          [facility]   PHYSICIAN:  Kathreen Cosier, M.D.DATE OF BIRTH:  22-Dec-1985   DATE OF ADMISSION:  10/08/2005  DATE OF DISCHARGE:  10/11/2005                                 DISCHARGE SUMMARY   Patient is a 25 year old primigravida.  [facility] December 22, 2005.  Was admitted  with nausea, vomiting less than 24 hours.  Ketones were greater than 80.  Patient was extremely lethargic while hospitalized and her drug screen was  negative.  Hemoglobin 9.4, white count 9.6.  The biophysical profile was  8.8.  She had a negative urine drug screen.  She had a sonogram of the  gallbladder.  She gradually improved and was seen by the gastroenterologist  who had no additional orders.  She was discharged on October 11, 2005  ambulatory, on a regular diet, on Reglan 10 mg p.o. t.i.d., Ambien 500 h.s.,  Xanax 0.5 t.i.d. p.r.n. for anxiety.   DISCHARGE DIAGNOSES:  Status post hospitalization for gastroenteritis.           ______________________________  Kathreen Cosier, M.D.     BAM/MEDQ  D:  11/08/2005  T:  11/08/2005  Job:  [id number]

## 2010-11-11 NOTE — Discharge Summary (Signed)
NAME:  Carla Haas, Carla Haas NO.:  [id number]   MEDICAL RECORD NO.:  [id number]          PATIENT TYPE:  INP   LOCATION:  9319                          [facility]   PHYSICIAN:  Kathreen Cosier, M.D.DATE OF BIRTH:  [birth year]   DATE OF ADMISSION:  06/30/2007  DATE OF DISCHARGE:  07/05/2007                               DISCHARGE SUMMARY   The patient is a 25 year old gravida 2, para 1-0-0-1, EDC of August 28, 2007, [redacted] weeks pregnant.  She was hospitalized previously on  Thanksgiving with nausea and vomiting, and was admitted with the same.  The patient was seen by GI and psychiatry on a previous visit and the  patient did not do any followup visit to the psychiatric clinic.  On  admission, her SMA 24 was normal.  The patient was started on a steroid  taper.  Her hemoglobin was 8.9, white count 9, potassium 3.6, sodium  134, amylase normal.  The patient got Flagyl IV because of Trichomonas  and developed diarrhea.  Ultrasound repeat showed baby lagging one week  behind dates.  The patient spent all day in the fetal position in bed,  very unresponsive.  But, by January 8, she suddenly felt better, started  eating a regular diet with no vomiting, no more diarrhea, and she was  discharged home on January 9 on a steroid taper and an appointment for  mental health.   DISCHARGE DIAGNOSIS:  Status post hyperemesis of pregnancy.           ______________________________  Kathreen Cosier, M.D.     BAM/MEDQ  D:  07/17/2007  T:  07/17/2007  Job:  [id number]

## 2010-11-11 NOTE — Discharge Summary (Signed)
NAME:  Carla Haas, Carla Haas NO.:  [id number]   MEDICAL RECORD NO.:  [id number]          PATIENT TYPE:  INP   LOCATION:  9153                          [facility]   PHYSICIAN:  Kathreen Cosier, M.D.DATE OF BIRTH:  [birth year]   DATE OF ADMISSION:  11/11/2005  DATE OF DISCHARGE:  11/14/2005                                 DISCHARGE SUMMARY   The patient is a 25 year old gravida 1, EDC December 22, 2005, who was  readmitted with nausea and vomiting, ketones greater than 80.  She has had  multiple hospitalizations during this pregnancy with nausea and vomiting  which she attributes to stress.  She has had her gallbladder removed in the  past and she has had endoscopy.   On this admission, she was hydrated with Ringer's lactate and her potassium  was 3.3.  She received 40 mg KCl x3 and Zofran 4 mg IV every 4 hours.  Since  the time of admission on May 19, she had no more vomiting by May 20.  Zofran  was changed from IV to p.o. every 4 hours.  On May 21, she had one episode  of vomiting.   Her hemoglobin was 9.8, platelets 371, white count 9.3.  Sodium 137,  potassium 3.8, chloride 113.  Ultrasound was negative.   She was discharged on May 22 later on a regular diet, on Zofran 400 mg p.o.  q.8 h p.r.n.           ______________________________  Kathreen Cosier, M.D.     BAM/MEDQ  D:  01/10/2006  T:  01/10/2006  Job:  [id number]

## 2010-11-11 NOTE — Op Note (Signed)
[facility]  Patient:    Carla Haas, Carla Haas Visit Number: [id number] MRN: [id number]          Service Type: DSU Location: PEDS 6124 01 Attending Physician:  Sonda Primes Dictated by:   Mardene Celeste. Lurene Shadow, M.D. Proc. Date: 10/07/01 Admit Date:  10/07/2001                             Operative Report  PREOPERATIVE DIAGNOSIS:  Chronic calculus cholecystitis.  POSTOPERATIVE DIAGNOSIS:  Chronic calculus cholecystitis.  OPERATION PERFORMED:  Laparoscopic cholecystectomy with intraoperative cholangiogram.  SURGEON:  Luisa Hart L. Lurene Shadow, M.D.  ASSISTANT:  Marnee Spring. Wiliam Ke, M.D.  ANESTHESIA:  General.  INDICATIONS FOR PROCEDURE:  The patient is a 25 year old young woman who is an orphan.  Her legal guardian is her older brother, who is 40 years old.  He brings her to the office with severe abdominal pain associated with nausea and vomiting usually following food.  Gallbladder ultrasound confirms cholelithiasis.  She does not have any chills or fever.  Liver function tests are pending.  The risks and potential benefits of surgery have been fully discussed with this girl and with her brother.  They accept these risks and come to surgery.  DESCRIPTION OF PROCEDURE:  Following induction of satisfactory general anesthesia, the patient was positioned supine and the abdomen was prepped and draped to be included in a sterile operative field.  Open laparoscopy performed at the umbilicus with insufflation of the peritoneal cavity to 15 mHg pressure.  The camera was inserted and visual exploration of the abdomen carried out.  No abnormalities of his stomach, duodenum, large and small intestine viewed.  The uterus was normal size.  Adnexal structures were not visualized.  Under direct vision, epigastric and lateral ports were placed.  The gallbladder was grasped and retracted cephalad.  Dissection near the ampulla allowed the isolation of the cystic  artery and cystic duct.  The cystic artery was then doubly clipped and transected, the cystic duct clipped proximally and opened.  A cystic duct cholangiogram was carried out by putting a Reddick catheter into the duct of the cystic duct and injected one half strength Hypaque into the ductal system.  No filling defects were noted within the extrahepatic biliary ducts.  There was free flow of contrast into the duodenum.  The cholangiocatheter was then removed from the duct.  The cystic duct was then doubly clipped and transected and the gallbladder dissected free from the liver bed using electrocautery and maintaining hemostasis throughout the entire course of the dissection.  At the end of dissection, all areas of the liver bed were checked for hemostasis, noted to be dry.  The camera was moved to the epigastric port and the gallbladder withdrew from the umbilical port without difficulty.  The right upper quadrant was thoroughly irrigated and aspirated.  The pneumoperitoneum allowed to deflate.  Sponge, instrument and sharp counts were verified.  Trocars were removed under direct vision. The wounds were then closed in layers as follows.  Umbilical wound in two layers with 0 Dexon and 4-0 Dexon.  Epigastric and lateral flank wounds closed with 4-0 Dexon sutures.  All wounds were reinforced with Steri-Strips, sterile dressings applied.  Anesthetic reversed.  Patient removed from the operating room to the recovery room in stable condition having tolerated the procedure well. Dictated by:   Mardene Celeste. Lurene Shadow, M.D. Attending Physician:  Sonda Primes DD:  10/07/01  TD:  10/07/01 Job: [id number]

## 2010-11-11 NOTE — Discharge Summary (Signed)
NAMEMELVINA, PANGELINAN NO.:  [id number]   MEDICAL RECORD NO.:  [id number]          PATIENT TYPE:  INP   LOCATION:  9319                          FACILITY:  [facility]   PHYSICIAN:  Kathreen Cosier, M.D.DATE OF BIRTH:  January 09, 1986   DATE OF ADMISSION:  03/02/2007  DATE OF DISCHARGE:  03/06/2007                               DISCHARGE SUMMARY   The patient is a 25 year old para 1 at [redacted] weeks pregnant with nausea,  vomiting who was admitted for control of hyperemesis.  She was started  on steroid taper on September 8th and IV and did well.  By September 9th  she was feeling much better and was discharged on September 10th on a  steroid taper to see me in 1 week.   DISCHARGE DIAGNOSIS:  Status post hyperemesis.           ______________________________  Kathreen Cosier, M.D.     BAM/MEDQ  D:  04/17/2007  T:  04/17/2007  Job:  [id number]

## 2010-11-11 NOTE — Consult Note (Signed)
NAMEJAXYN, MESTAS NO.:  [id number]   MEDICAL RECORD NO.:  [id number]          PATIENT TYPE:  INP   LOCATION:  9156                          FACILITY:  [facility]   PHYSICIAN:  James L. Malon Kindle., M.D.DATE OF BIRTH:  08-30-1985   DATE OF CONSULTATION:  10/09/2005  DATE OF DISCHARGE:                                   CONSULTATION   GASTROINTESTINAL CONSULT   REFERRED BY:  Dr. Francoise Ceo.   HISTORY:  A 25 year old with her first pregnancy is admitted for a second  episode of nausea and vomiting.  She was admitted in January for 4 days.  Labs were all normal.  She was given Medrol and got better with a diagnosis  of hyperemesis with pregnancy.  She is post cholecystectomy, had a history  of this 2 years ago for unclear reasons, and is unable to tell me if she had  gallstones.  She came back in after several days of nausea and vomiting, and  no real abdominal pain.  Gallbladder ultrasound confirmed that the  gallbladder was missing.  It showed no dilated ducts.  Labs reveal a white  count of 9.6 and hemoglobin of 9.4.  Renal function and hepatic function  tests are normal.  Amylase is slightly elevated at 151.  Drug screen was  negative.  The patient states that she is clinically feeling better than she  was the first time she was in.   CURRENT MEDICATIONS:  Phenergan, Zofran, terbutaline and Reglan.   ALLERGIES:  SHE HAS NO KNOWN DRUG ALLERGIES.   MEDICAL HISTORY:  Reviewed.  Other then the cholecystectomy, no chronic  medical problems.   FAMILY HISTORY:  The patient is unable to tell me if anybody in the family  has had ulcers.   REVIEW OF SYSTEMS:  Remarkable for lack of diarrhea, lack of previous  history of ulcers, heartburn and indigestion at this point although she did  have some earlier in her pregnancy.  She denies taking anything for acid.   PHYSICAL EXAMINATION:  VITAL SIGNS:  Unremarkable.  GENERAL:  The patient is somewhat uncooperative.   There is no obvious  distress.  EYES:  Clear.  Nonicteric.  THROAT:  Moist mucous membranes.  HEART:  Regular rate and rhythm without murmurs, rubs or gallops.  LUNGS:  Clear.  ABDOMEN:  A gravid uterus, generally soft and nontender.   ASSESSMENT:  Nausea and vomiting, probably hyperemesis with pregnancy.  This  is her second episode in 29 weeks.  She is post cholecystectomy.  I would  not do an endoscopy now.  She has had an ultrasound that was okay.  Her labs  are okay.  I think the amylase is probably insignificant in related to the  vomiting.  Pancreas did not look abnormal on ultrasound.   RECOMMENDATION:  We are going to follow her along and treat her nausea  symptomatically, and try to improve her p.o. intake.  I would consider  adding a proton pump inhibitor if you feel this is acceptable in a pregnant  woman and we will see her back in 2 days  if she is still in hospital.           ______________________________  Llana Aliment. Malon Kindle., M.D.     Waldron Session  D:  10/09/2005  T:  10/10/2005  Job:  [id number]   cc:   Fayrene Fearing L. Malon Kindle., M.D.  Fax: [phone]

## 2010-11-11 NOTE — Discharge Summary (Signed)
NAMEJAMESA, TEDRICK NO.:  [id number]   MEDICAL RECORD NO.:  [id number]          PATIENT TYPE:  INP   LOCATION:  9151                          [facility]   PHYSICIAN:  Kathreen Cosier, M.D.DATE OF BIRTH:  02/02/1986   DATE OF ADMISSION:  11/08/2005  DATE OF DISCHARGE:  11/10/2005                                 DISCHARGE SUMMARY   The patient is a 25 year old gravida 1, EDC December 22, 2005.  This is one of  multiple admissions because of nausea and vomiting.  The patient states that  she started vomiting on the night of admission, May 16.  Urine ketones were  greater than 80.  The patient suffers from anxiety and her problems occur  when she is under stress.  She was also on Xanax 0.5 p.o. t.i.d.  She had  her gallbladder removed at age 32 and she has had endoscopy in the past.   While hospitalized, the patient had no vomiting after her initial episode  and she was hydrated with Ringer's lactate.  Her white count was 9.9,  platelets 345, white count 7.  Sodium 137, potassium 3.3, chloride 108, CO2  20.  BUN 4, creatinine 0.7.  Her albumin was 2.8.   She was discharged home on Nov 10, 2005 on Reglan 10 mg p.o. t.i.d. p.r.n.,  Xanax 0.5 mg t.i.d. p.r.n.  To see me in one week.   DISCHARGE DIAGNOSIS:  Status post admission for nausea and vomiting.           ______________________________  Kathreen Cosier, M.D.     BAM/MEDQ  D:  01/10/2006  T:  01/10/2006  Job:  [id number]

## 2010-11-11 NOTE — Discharge Summary (Signed)
[name], [name] NO.:  [id number]   MEDICAL RECORD NO.:  [id number]          PATIENT TYPE:  INP   LOCATION:  9308                          [facility]ILITY[facility]   PHYSICIAN:  Kathreen Cosier, M.D.DATE OF BIRTH:  06-15-1986   DATE OF ADMISSION:  07/15/2005  DATE OF DISCHARGE:  07/19/2005                                 DISCHARGE SUMMARY   Patient is a 25 year old primigravida.  Last menstrual period February 26, 2005.  EDC unknown.  She was admitted because of abdominal pain, nausea,  vomiting.  Hepatitis panel, CMET all normal.  By January 22 she had no more  abdominal pain and she was started on a Medrol taper.  By January 24 she  felt much better.  She had had no vomiting and she was home on the steroid  taper protocol, to see me in one week.   DISCHARGE DIAGNOSES:  Status post hyperemesis of pregnancy.           ______________________________  Kathreen Cosier, M.D.     BAM/MEDQ  D:  08/09/2005  T:  08/09/2005  Job:  [id number]

## 2010-11-23 ENCOUNTER — Other Ambulatory Visit: Payer: Self-pay | Admitting: Physician Assistant

## 2010-11-23 DIAGNOSIS — O0930 Supervision of pregnancy with insufficient antenatal care, unspecified trimester: Secondary | ICD-10-CM

## 2010-11-23 LAB — POCT URINALYSIS DIP (DEVICE)
Bilirubin Urine: NEGATIVE
Hgb urine dipstick: NEGATIVE
Nitrite: NEGATIVE
pH: 7 (ref 5.0–8.0)

## 2010-11-25 ENCOUNTER — Inpatient Hospital Stay (HOSPITAL_COMMUNITY)
Admission: AD | Admit: 2010-11-25 | Discharge: 2010-11-27 | DRG: 775 | Disposition: A | Discharge status: Home / Self Care | Payer: Medicaid Other | Source: Ambulatory Visit | Attending: Obstetrics and Gynecology | Admitting: Obstetrics and Gynecology

## 2010-11-25 DIAGNOSIS — O9989 Other specified diseases and conditions complicating pregnancy, childbirth and the puerperium: Secondary | ICD-10-CM

## 2010-11-25 DIAGNOSIS — O4290 Premature rupture of membranes, unspecified as to length of time between rupture and onset of labor, unspecified weeks of gestation: Secondary | ICD-10-CM

## 2010-11-25 DIAGNOSIS — O99892 Other specified diseases and conditions complicating childbirth: Secondary | ICD-10-CM | POA: Diagnosis present

## 2010-11-25 DIAGNOSIS — Z22330 Carrier of Group B streptococcus: Secondary | ICD-10-CM

## 2010-11-25 DIAGNOSIS — O328XX0 Maternal care for other malpresentation of fetus, not applicable or unspecified: Secondary | ICD-10-CM | POA: Diagnosis present

## 2010-11-25 LAB — COMPREHENSIVE METABOLIC PANEL
AST: 20 U/L (ref 0–37)
BUN: 5 mg/dL — ABNORMAL LOW (ref 6–23)
CO2: 18 mEq/L — ABNORMAL LOW (ref 19–32)
Calcium: 8.4 mg/dL (ref 8.4–10.5)
Creatinine, Ser: 0.54 mg/dL (ref 0.4–1.2)
GFR calc Af Amer: >60 mL/min (ref >60)
GFR calc non Af Amer: >60 mL/min (ref >60)
Glucose, Bld: 65 mg/dL — ABNORMAL LOW (ref 70–99)

## 2010-11-25 LAB — CBC
Hemoglobin: 8.1 g/dL — ABNORMAL LOW (ref 12.0–15.0)
MCH: 23.1 pg — ABNORMAL LOW (ref 26.0–34.0)
MCHC: 31 g/dL (ref 30.0–36.0)
MCV: 74.4 fL — ABNORMAL LOW (ref 78.0–100.0)

## 2010-11-25 LAB — GLUCOSE, CAPILLARY: Glucose-Capillary: 79 mg/dL (ref 70–99)

## 2011-01-16 ENCOUNTER — Ambulatory Visit: Payer: Medicaid Other | Admitting: Obstetrics and Gynecology

## 2011-01-18 ENCOUNTER — Telehealth: Payer: Self-pay | Admitting: *Deleted

## 2011-01-18 NOTE — Telephone Encounter (Signed)
I spoke with our scheduling staff and was told that pt missed her scheduled appt last week for PP check.  I called pt and explained that she may return to work if she would like, however she will not receive an MD note until she has been seen @ clinic. Pt asked if she could be called in the event we had a cancellation. I told her that she may call back every few days to check as we do not keep a cancellation list. Pt voiced understanding.

## 2011-01-18 NOTE — Telephone Encounter (Signed)
Pt states she has appt. For PP check on 02-08-11 and would like to return to work prior to that date

## 2011-02-08 ENCOUNTER — Encounter: Payer: Self-pay | Admitting: *Deleted

## 2011-02-08 ENCOUNTER — Ambulatory Visit (INDEPENDENT_AMBULATORY_CARE_PROVIDER_SITE_OTHER): Payer: Medicaid Other | Admitting: Obstetrics and Gynecology

## 2011-02-08 NOTE — Progress Notes (Signed)
  Subjective:    Patient ID: Carla Haas, female    DOB: 11-12-1985, 25 y.o.   MRN: [id number]  HPI 25 yo [id number] s/p NSVD on 11/25/2010 presenting today for postpartum check. Patient is currently without any complaints; denies abnormal bleeding or discharge. Patient is bottle feeding and reports that the infant is thriving. Patient reports receiving ample support and help with the infant and denies si/sx of postpartum depression. Patient has had sexual intercourse and is using condoms for birth control. Patient is interested in Mirena IUD. Patient has had a normal period on 01/29/2011   Review of Systems  All other systems reviewed and are negative.       Objective:   Physical Exam  GENERAL: Well-developed, well-nourished female in no acute distress.  HEENT: Normocephalic, atraumatic. Sclerae anicteric.  NECK: Supple. Normal thyroid.  LUNGS: Clear to auscultation bilaterally.  HEART: Regular rate and rhythm. BREASTS: Symmetric in size. No palpable masses or lymphadenopathy, skin changes. ABDOMEN: Soft, nontender, nondistended. No organomegaly. PELVIC: Normal external female genitalia. Vagina is pink and rugated.  Normal discharge. Normal appearing cervix. Uterus is normal in size.  No adnexal mass or tenderness. EXTREMITIES: No cyanosis, clubbing, or edema, 2+ distal pulses.      Assessment & Plan:  25 yo G4 P3013 here for postpartum check - Patient is medically cleared to resume all levels of physical activity - Patient will return in April for physical exam -Patient will schedule IUD insertion appointment

## 2011-02-08 NOTE — Patient Instructions (Addendum)
May return to all prepregnancy activities.IMPORTANT: HOW TO USE THIS INFORMATION:  This is a summary and does NOT have all possible information about this product. This information does not assure that this product is safe, effective, or appropriate for you. This information is not individual medical advice and does not substitute for the advice of your health care professional. Always ask your health care professional for complete information about this product and your specific health needs.    LEVONORGESTREL-RELEASING IMPLANT - INTRAUTERINE (lee-voh-nor-JEST-rell)    COMMON BRAND NAME(S): Mirena    USES:  This product is a small, flexible device that is placed in the womb (uterus) to prevent pregnancy. It is used in women who desire reversible birth control that works for a long time (up to 5 years). The device works by slowly releasing a hormone (levonorgestrel) that is similar to a certain substance made by a woman's body. This product is only intended for women who have previously given birth and have only one sexual partner. It is not meant for women with a history of certain infections/conditions (e.g., pelvic inflammatory disease, sexually transmitted disease, a certain problem pregnancy called ectopic pregnancy). For more information, consult your doctor. The use of this medication device does not protect you or your partner against sexually transmitted diseases (e.g., HIV, gonorrhea). Carefully read all of the information provided by your doctor, and ask any questions you may have about this product or other birth control methods that may be right for you.    HOW TO USE:  Read the Patient Information Leaflet provided by your pharmacist before this medication device is inserted and each time it is re-inserted. The leaflet contains very important information about side effects and when it is important to call your doctor. If you have any questions, consult your doctor or pharmacist. This product is  inserted into your uterus by a properly trained health care professional, usually once every 5 years or as determined by your doctor. The medication in the device is slowly released into the body over a 5-year period. Have a follow-up appointment 4-12 weeks after insertion of this product to check that it is still correctly in place. If you still desire birth control after 5 years, the medication device may be replaced with a new one. The medication device may also be removed at any time by a properly trained health care professional. Learn all the instructions on how and when to check this product and its proper positioning in your body, and make sure you understand the problems that may occur with this product. See also Precautions section.    SIDE EFFECTS:  Irregular vaginal bleeding (e.g., spotting), cramps, headache, nausea, breast pain, acne, rash, hair loss, weight gain, or decreased interest in sex may occur. If any of these effects persist or worsen, tell your doctor promptly. Remember that your doctor has prescribed this medication device because he or she has judged that the benefit to you is greater than the risk of side effects. Many people using this medication device do not have serious side effects. Tell your doctor immediately if any of these serious side effects occur: lack of menstrual period, unexplained fever, chills, trouble breathing, mental/mood changes (e.g., depression, nervousness), vaginal swelling/itching, painful intercourse. Tell your doctor immediately if any of these unlikely but serious side effects occur: migraine/severe headache, vomiting, tiredness, fast/pounding heartbeat. Tell your doctor immediately if any of these highly unlikely but very serious side effects occur: prolonged or heavy vaginal bleeding, unusual vaginal discharge/odor,  vaginal sores, abdominal/pelvic pain or tenderness, lumps in the breast, yellowing eyes/skin, dark urine, persistent nausea, trouble  urinating. A very serious allergic reaction to this drug is rare. However, seek immediate medical attention if you notice any of the following symptoms of a serious allergic reaction: rash, itching/swelling (especially of the face/tongue/throat), severe dizziness, trouble breathing. This is not a complete list of possible side effects. If you notice other effects not listed above, contact your doctor or pharmacist. In the [place] - Call your doctor for medical advice about side effects. You may report side effects to FDA at 1-800-FDA-1088. In [place] - Call your doctor for medical advice about side effects. You may report side effects to [facility] [place] at [phone].    PRECAUTIONS:  Before using this medication device, tell your doctor or pharmacist if you are allergic to levonorgestrel, or to any other progestins (e.g., norethindrone, desogestrel); or if you have any other allergies. This product may contain inactive ingredients, which can cause allergic reactions or other problems. Talk to your pharmacist for more details. This medication device should not be used if you have certain medical conditions. Before using this product, consult your doctor or pharmacist if you have: current known or suspected pregnancy, previous ectopic pregnancy, uterus problems (e.g., cancer, endometriosis, fibroids, pelvic inflammatory disease-PID), other IUD (intrauterine device) still in place, vaginal problems (e.g., infection), breast cancer, liver disease/tumors, any condition that affects your immune system (e.g., AIDS, leukemia). Before using this product, tell your doctor your medical history, especially of: bleeding problems (e.g., menstrual changes, clotting problems), heart problems (e.g., congenital valve conditions), high blood pressure, migraine headaches, stroke, diabetes. If you have diabetes, this medication may make it harder to control your blood sugar levels. Monitor your blood sugar regularly as directed by your  doctor. Tell your doctor the results and any symptoms such as increased thirst/urination. Your anti-diabetic medication or diet may need to be adjusted. This medication device may sometimes come out by itself or move out of place. This may result in unwanted pregnancy or other problems. After each menstrual period, check to make sure it is in the right place. Talk to your doctor about how to check your device. If it comes out or you cannot feel its threads, call your doctor promptly, and use a backup birth control method such as condoms. If you or partner has any other sexual partners, this medication device may no longer be a good choice for pregnancy prevention. If you or your partner becomes HIV positive, or if you think you may have been exposed to any sexually transmitted disease, contact your doctor immediately. You should consider having this device removed. This medication device must not be used during pregnancy. If you become pregnant or think you may be pregnant, tell your doctor immediately. If you have just given birth and are not breast-feeding, or if you have had a pregnancy loss or abortion after the 3 months of pregnancy, wait at least 6 weeks (or as directed by your doctor) before using this medication device. Consult your doctor about the problems that may occur during pregnancy while using this product. Levonorgestrel passes into breast milk. Consult your doctor before breast-feeding.    DRUG INTERACTIONS:  Your doctor or pharmacist may already be aware of any possible drug interactions and may be monitoring you for them. Do not start, stop, or change the dosage of any medicine before checking with them first. Before using this medication device, tell your doctor of all prescription and nonprescription  medications you may use, especially of: "blood thinners" (e.g., warfarin), birth control taken by mouth or applied to the skin (patch), certain drug used for varicose vein treatment (sodium  tetradecyl sulfate), drugs that affect your immune response (e.g., corticosteroids such as prednisone). This document does not contain all possible interactions. Therefore, before using this product, tell your doctor or pharmacist of all the products you use. Keep a list of all your medications with you, and share the list with your doctor and pharmacist.    OVERDOSE:  Overdose with this medication is very unlikely because of the way the drug is released from this device. Consult your doctor or pharmacist for more information.    NOTES:  Do not share this medication with others. Keep all appointments with your doctor and the laboratory. You should have regular complete physical exams including blood pressure, breast exam, pelvic exam, and screening for cervical cancer (Pap smear). Follow your doctor's instructions for examining your own breasts, and report any lumps immediately. Consult your doctor for more details.    MISSED DOSE:  Not applicable.    STORAGE:  Before use, store at room temperature at 77 degrees F (25 degrees C) away from light and moisture. Brief storage between 59-86 degrees F (15-30 degrees C) is permitted. Keep all medications and medical devices away from children and pets. Do not flush medications down the toilet or pour them into a drain unless instructed to do so. Properly discard this product when it is expired or no longer needed. Consult your pharmacist or local waste disposal company for more details about how to safely discard your product.    Information last revised June 2010 Copyright(c) 2010 [employer].

## 2011-03-15 ENCOUNTER — Encounter: Payer: Self-pay | Admitting: Family Medicine

## 2011-03-15 LAB — CBC
HCT: 26.4 — ABNORMAL LOW
MCHC: 33.8
MCV: 85.2
Platelets: 363
RDW: 14
WBC: 9

## 2011-03-15 LAB — URINE MICROSCOPIC-ADD ON

## 2011-03-15 LAB — URINALYSIS, ROUTINE W REFLEX MICROSCOPIC
Bilirubin Urine: NEGATIVE
Glucose, UA: NEGATIVE
Hgb urine dipstick: NEGATIVE
Nitrite: NEGATIVE
Protein, ur: 30 — AB
Protein, ur: NEGATIVE
Specific Gravity, Urine: 1.015
Urobilinogen, UA: 0.2
Urobilinogen, UA: 0.2

## 2011-03-15 LAB — COMPREHENSIVE METABOLIC PANEL
AST: 25
Albumin: 2.6 — ABNORMAL LOW
BUN: 2 — ABNORMAL LOW
Calcium: 8.3 — ABNORMAL LOW
Creatinine, Ser: 0.53
GFR calc Af Amer: >60
Total Bilirubin: 0.7
Total Protein: 6.1

## 2011-03-15 LAB — LIPASE, BLOOD: Lipase: 21

## 2011-03-20 ENCOUNTER — Ambulatory Visit: Payer: Medicaid Other | Admitting: Family Medicine

## 2011-03-20 LAB — CBC
HCT: 24.7 — ABNORMAL LOW
HCT: 26.6 — ABNORMAL LOW
Hemoglobin: 8.3 — ABNORMAL LOW
Hemoglobin: 8.9 — ABNORMAL LOW
MCHC: 33.5
MCV: 79.1
Platelets: 333
RBC: 3.11 — ABNORMAL LOW
RBC: 3.37 — ABNORMAL LOW
RDW: 16.8 — ABNORMAL HIGH
WBC: 9.9

## 2011-04-03 LAB — COMPREHENSIVE METABOLIC PANEL
ALT: 21
Alkaline Phosphatase: 36 — ABNORMAL LOW
Alkaline Phosphatase: 47
BUN: 2 — ABNORMAL LOW
BUN: 3 — ABNORMAL LOW
CO2: 24
Calcium: 8.1 — ABNORMAL LOW
Calcium: 8.2 — ABNORMAL LOW
Creatinine, Ser: 0.49
GFR calc non Af Amer: >60
Glucose, Bld: 76
Glucose, Bld: 93
Sodium: 137
Total Protein: 5.2 — ABNORMAL LOW

## 2011-04-03 LAB — CBC
HCT: 25.5 — ABNORMAL LOW
HCT: 26.6 — ABNORMAL LOW
MCHC: 34
MCV: 88.2
MCV: 88.8
Platelets: 238
Platelets: 283
Platelets: 81 — ABNORMAL LOW
RDW: 14.1
RDW: 14.4
WBC: 8

## 2011-04-03 LAB — DIFFERENTIAL
Basophils Absolute: 0
Basophils Absolute: 0.1
Basophils Relative: 0
Basophils Relative: 1
Eosinophils Relative: 3
Lymphocytes Relative: 24
Lymphocytes Relative: 32
Lymphs Abs: 2.1
Monocytes Absolute: 0.6
Monocytes Relative: 6
Neutro Abs: 3.4
Neutro Abs: 4
Neutro Abs: 4.6
Neutrophils Relative %: 59

## 2011-04-03 LAB — HEPATITIS B SURFACE ANTIGEN: Hepatitis B Surface Ag: NEGATIVE

## 2011-04-03 LAB — RUBELLA SCREEN: Rubella: 38.9 — ABNORMAL HIGH

## 2011-04-03 LAB — AMYLASE: Amylase: 173 — ABNORMAL HIGH

## 2011-04-03 LAB — T3, FREE: T3, Free: 1.5 — ABNORMAL LOW (ref 2.3–4.2)

## 2011-04-03 LAB — HIV ANTIBODY (ROUTINE TESTING W REFLEX): HIV: NONREACTIVE

## 2011-04-03 LAB — T4, FREE: Free T4: 0.86 — ABNORMAL LOW

## 2011-04-03 LAB — LIPASE, BLOOD: Lipase: 31

## 2011-04-03 LAB — BASIC METABOLIC PANEL
Calcium: 8.4
Chloride: 103
Creatinine, Ser: 0.58
GFR calc Af Amer: >60
Sodium: 132 — ABNORMAL LOW

## 2011-04-03 LAB — PREALBUMIN: Prealbumin: 11.4 — ABNORMAL LOW

## 2011-04-03 LAB — PHOSPHORUS: Phosphorus: 4.6

## 2011-04-03 LAB — RPR: RPR Ser Ql: NONREACTIVE

## 2011-04-04 LAB — CBC
MCHC: 33.8
MCV: 88.9
Platelets: 384
RDW: 13.9

## 2011-04-04 LAB — COMPREHENSIVE METABOLIC PANEL
AST: 30
CO2: 21
Calcium: 8.8
Creatinine, Ser: 0.58
GFR calc Af Amer: >60
GFR calc non Af Amer: >60

## 2011-04-04 LAB — URINALYSIS, ROUTINE W REFLEX MICROSCOPIC
Bilirubin Urine: NEGATIVE
Hgb urine dipstick: NEGATIVE
Specific Gravity, Urine: >1.03 — ABNORMAL HIGH
Urobilinogen, UA: 0.2
pH: 6

## 2011-04-04 LAB — STREP B DNA PROBE: Strep Group B Ag: NEGATIVE

## 2011-04-04 LAB — WET PREP, GENITAL: Clue Cells Wet Prep HPF POC: NONE SEEN

## 2011-04-06 ENCOUNTER — Ambulatory Visit (INDEPENDENT_AMBULATORY_CARE_PROVIDER_SITE_OTHER): Payer: Medicaid Other | Admitting: Family Medicine

## 2011-04-06 VITALS — BP 121/78 | HR 67 | Temp 97.0°F | Ht 59.0 in | Wt 102.0 lb

## 2011-04-06 DIAGNOSIS — Z3201 Encounter for pregnancy test, result positive: Secondary | ICD-10-CM

## 2011-04-06 DIAGNOSIS — Z01812 Encounter for preprocedural laboratory examination: Secondary | ICD-10-CM

## 2011-04-06 DIAGNOSIS — Z23 Encounter for immunization: Secondary | ICD-10-CM

## 2011-04-06 LAB — URINALYSIS, ROUTINE W REFLEX MICROSCOPIC
Glucose, UA: NEGATIVE
Hgb urine dipstick: NEGATIVE
Specific Gravity, Urine: 1.025
pH: 6

## 2011-04-06 LAB — POCT PREGNANCY, URINE: Preg Test, Ur: POSITIVE

## 2011-04-06 LAB — URINE MICROSCOPIC-ADD ON

## 2011-04-06 MED ORDER — INFLUENZA VIRUS VACC SPLIT PF IM SUSP
0.5000 mL | Freq: Once | INTRAMUSCULAR | Status: AC
  Administered 2011-04-06: 0.5 mL via INTRAMUSCULAR

## 2011-04-06 NOTE — Progress Notes (Signed)
Addended by: Sherre Lain A on: 04/06/2011 04:41 PM   Modules accepted: Orders

## 2011-04-06 NOTE — Progress Notes (Signed)
Pt would like to get flu vaccine. Had positive pregnancy test today. Would like to speak with Dr.

## 2011-04-06 NOTE — Patient Instructions (Signed)
Pregnancy If you are planning on getting pregnant, it is a good idea to make a preconception appointment with your care- giver to discuss having a healthy lifestyle before getting pregnant. Such as, diet, weight, exercise, taking prenatal vitamins especially folic acid (it helps prevent brain and spinal cord defects), avoiding alcohol, smoking and illegal drugs, medical problems (diabetes, convulsions), family history of genetic problems, working conditions and immunizations. It is better to have knowledge of these things and do something about them before getting pregnant. In your pregnancy, it is important to follow certain guidelines to have a healthy baby. It is very important to get good prenatal care and follow your caregiver's instructions. Prenatal care includes all the medical care you receive before your baby's birth. This helps to prevent problems during the pregnancy and childbirth. HOME CARE INSTRUCTIONS  Start your prenatal visits by the 12th week of pregnancy or before when possible. They are usually scheduled monthly at first. They are more often in the last 2 months before delivery. It is important that you keep your caregiver's appointments and follow your caregiver's instructions regarding medication use, exercise, and diet.   During pregnancy, you are providing food for you and your baby. Eat a regular, well-balanced diet. Choose foods such as meat, fish, milk and other dairy products, vegetables, fruits, whole-grain breads and cereals. Your caregiver will inform you of the ideal weight gain depending on your current height and weight. Drink lots of liquids. Try to drink 8 glasses of water a day.   Alcohol is associated with a number of birth defects including fetal alcohol syndrome. It is best to avoid alcohol completely. Smoking will cause low birth rate and prematurity. Use of alcohol and nicotine during your pregnancy also increases the chances that your child will be chemically  dependent later in their life and may contribute to SIDS (Sudden Infant Death Syndrome).   Do not use illegal drugs.   Only take prescription or over-the-counter medications that are recommended by your caregiver. Other medications can cause genetic and physical problems in the baby.   Morning sickness can often be helped by keeping soda crackers at the bedside. Eat a couple before arising in the morning.   A sexual relationship may be continued until near the end of pregnancy if there are no other problems such as early (premature) leaking of amniotic fluid from the membranes, vaginal bleeding, painful intercourse or belly (abdominal) pain.   Exercise regularly. Check with your caregiver if you are unsure of the safety of some of your exercises.   Do not use hot tubs, steam rooms or saunas. These increase the risk of fainting or passing out and hurting yourself and the baby. Swimming is OK for exercise. Get plenty of rest, including afternoon naps when possible especially in the third trimester.   Avoid toxic odors and chemicals.   Do not wear high heels. They may cause you to lose your balance and fall.   Do not lift over 5 pounds. If you do lift anything, lift with your legs and thighs not your back.   Avoid long trips, especially in the third trimester.   If you have to travel out of the city or state, take a copy of your medical records with you.  SEEK IMMEDIATE MEDICAL CARE IF:  You develop an unexplained oral temperature above 100.4 F, or as your caregiver suggests.   You have leaking of fluid from the vagina. If leaking membranes are suspected, take your temperature and inform  your caregiver of this when you call.   There is vaginal spotting or bleeding. Notify your caregiver of the amount and how many pads are used.   You continue to feel sick to your stomach (nauseous) and have no relief from remedies suggested, or you throw up (vomit) blood or coffee ground like materials.     You develop upper abdominal pain.   You have round ligament discomfort in the lower abdominal area. This still must be evaluated by your caregiver.   You feel contractions of the uterus.   You do not feel the baby move, or there is less movement than before.   You have painful urination.   You have abnormal vaginal discharge.   You have persistent diarrhea.   You get a severe headache.   You have problems with your vision.   You develop muscle weakness.   You feel dizzy and faint.   You develop shortness of breath.   You develop chest pain.   You have back pain that travels down to your leg and feet.   You feel irregular or a very fast heartbeat.   You develop excessive weight gain in a short period of time (5 pounds in 3 to 5 days).   You are involved with a domestic violence situation.  Document Released: 06/12/2005 Document Re-Released: 09/06/2009 [facility] Patient Information 2011 [place], [place].

## 2011-04-06 NOTE — Progress Notes (Signed)
Patient presents for IUD placement, but her urine pregnancy test is positive. She denies vaginal bleeding, discharge, or any other symptoms. Her LMP was 15 September. She is G4P3003, and last delivered on 1 June; she breastfed for only 1 month.  Exam: Filed Vitals:   04/06/11 1609  BP: 121/78  Pulse: 67  Temp: 97 F (36.1 C)   Nondistressed.  A/P Pregnancy test positive: Pt states she has Medicaid, but will need new pregnancy verification letter to take to them to re-enroll for pregnancy care. She plans to have postpartum BTL after this delivery. She states she has PNV at home. She will return in 4 weeks for New OB/[place]; discussed emergency needs can be seen through [facility] as necessary.

## 2011-04-07 LAB — COMPREHENSIVE METABOLIC PANEL
ALT: 33
ALT: 46 — ABNORMAL HIGH
Albumin: 2.7 — ABNORMAL LOW
Alkaline Phosphatase: 35 — ABNORMAL LOW
Alkaline Phosphatase: 45
BUN: 11
CO2: 24
CO2: 24
Calcium: 8.5
Calcium: 8.6
Chloride: 101
Chloride: 103
Creatinine, Ser: 0.57
GFR calc Af Amer: >60
GFR calc non Af Amer: >60
GFR calc non Af Amer: >60
Glucose, Bld: 127 — ABNORMAL HIGH
Glucose, Bld: 68 — ABNORMAL LOW
Potassium: 3.1 — ABNORMAL LOW
Potassium: 4.1
Sodium: 133 — ABNORMAL LOW
Total Bilirubin: 0.4
Total Bilirubin: 1.3 — ABNORMAL HIGH
Total Protein: 5.9 — ABNORMAL LOW
Total Protein: 7.7

## 2011-04-07 LAB — URINE MICROSCOPIC-ADD ON

## 2011-04-07 LAB — URINALYSIS, ROUTINE W REFLEX MICROSCOPIC
Bilirubin Urine: NEGATIVE
Glucose, UA: NEGATIVE
Ketones, ur: >80 — AB
Ketones, ur: >80 — AB
Ketones, ur: NEGATIVE
Nitrite: NEGATIVE
Nitrite: NEGATIVE
Protein, ur: NEGATIVE
Protein, ur: NEGATIVE
Specific Gravity, Urine: 1.02
Urobilinogen, UA: 0.2
pH: 6.5

## 2011-04-07 LAB — RUBELLA SCREEN: Rubella: 39.5 — ABNORMAL HIGH

## 2011-04-07 LAB — DIFFERENTIAL
Basophils Absolute: 0
Basophils Relative: 0
Eosinophils Relative: 0
Lymphocytes Relative: 15
Monocytes Absolute: 0.2
Monocytes Relative: 2 — ABNORMAL LOW

## 2011-04-07 LAB — CBC
HCT: 32.5 — ABNORMAL LOW
HCT: 36.5
Hemoglobin: 11.1 — ABNORMAL LOW
Hemoglobin: 12.3
MCHC: 34
RBC: 3.88
RBC: 4.31
RDW: 20.5 — ABNORMAL HIGH
RDW: 20.7 — ABNORMAL HIGH

## 2011-04-07 LAB — BASIC METABOLIC PANEL
Calcium: 7.7 — ABNORMAL LOW
Creatinine, Ser: 0.64
GFR calc Af Amer: >60
GFR calc non Af Amer: >60
Sodium: 135

## 2011-04-10 LAB — URINALYSIS, ROUTINE W REFLEX MICROSCOPIC
Bilirubin Urine: NEGATIVE
Nitrite: NEGATIVE
Specific Gravity, Urine: 1.024
Urobilinogen, UA: 1
pH: 7

## 2011-04-10 LAB — URINE MICROSCOPIC-ADD ON

## 2011-04-10 LAB — POCT PREGNANCY, URINE
Operator id: 272551
Preg Test, Ur: POSITIVE

## 2011-05-02 ENCOUNTER — Encounter: Payer: Self-pay | Admitting: *Deleted

## 2011-05-02 ENCOUNTER — Emergency Department (HOSPITAL_COMMUNITY)
Admission: EM | Admit: 2011-05-02 | Discharge: 2011-05-02 | Discharge status: Against Medical Advice | Payer: Medicaid Other | Attending: Emergency Medicine | Admitting: Emergency Medicine

## 2011-05-02 DIAGNOSIS — O219 Vomiting of pregnancy, unspecified: Secondary | ICD-10-CM | POA: Insufficient documentation

## 2011-05-02 NOTE — ED Notes (Signed)
Pt in c/o n/v x3-4 days, pt states she is pregnant, unsure how far along

## 2011-05-03 ENCOUNTER — Inpatient Hospital Stay (HOSPITAL_COMMUNITY): Payer: Medicaid Other

## 2011-05-03 ENCOUNTER — Encounter (HOSPITAL_COMMUNITY): Payer: Self-pay | Admitting: *Deleted

## 2011-05-03 ENCOUNTER — Inpatient Hospital Stay (HOSPITAL_COMMUNITY)
Admission: AD | Admit: 2011-05-03 | Discharge: 2011-05-03 | Disposition: A | Discharge status: Home / Self Care | Payer: Medicaid Other | Source: Ambulatory Visit | Attending: Obstetrics & Gynecology | Admitting: Obstetrics & Gynecology

## 2011-05-03 DIAGNOSIS — O210 Mild hyperemesis gravidarum: Secondary | ICD-10-CM | POA: Insufficient documentation

## 2011-05-03 HISTORY — DX: Urinary tract infection, site not specified: N39.0

## 2011-05-03 HISTORY — DX: Anxiety disorder, unspecified: F41.9

## 2011-05-03 LAB — CBC
Platelets: 284 10*3/uL (ref 150–400)
RBC: 4.26 MIL/uL (ref 3.87–5.11)
WBC: 8.8 10*3/uL (ref 4.0–10.5)

## 2011-05-03 LAB — URINE MICROSCOPIC-ADD ON

## 2011-05-03 LAB — COMPREHENSIVE METABOLIC PANEL
ALT: 41 U/L — ABNORMAL HIGH (ref 0–35)
AST: 26 U/L (ref 0–37)
Albumin: 4.3 g/dL (ref 3.5–5.2)
Alkaline Phosphatase: 54 U/L (ref 39–117)
Potassium: 3.4 mEq/L — ABNORMAL LOW (ref 3.5–5.1)
Sodium: 137 mEq/L (ref 135–145)
Total Protein: 7.9 g/dL (ref 6.0–8.3)

## 2011-05-03 LAB — URINALYSIS, ROUTINE W REFLEX MICROSCOPIC
Nitrite: NEGATIVE
Specific Gravity, Urine: 1.015 (ref 1.005–1.030)
Urobilinogen, UA: 1 mg/dL (ref 0.0–1.0)
pH: 7.5 (ref 5.0–8.0)

## 2011-05-03 LAB — RAPID URINE DRUG SCREEN, HOSP PERFORMED
Amphetamines: NOT DETECTED
Opiates: NOT DETECTED
Tetrahydrocannabinol: NOT DETECTED

## 2011-05-03 LAB — ABO/RH: ABO/RH(D): A POS

## 2011-05-03 LAB — HCG, QUANTITATIVE, PREGNANCY: hCG, Beta Chain, Quant, S: 70141 m[IU]/mL — ABNORMAL HIGH (ref <5)

## 2011-05-03 MED ORDER — ONDANSETRON 8 MG PO TBDP: 8.0000 mg | ORAL_TABLET | Freq: Once | ORAL | Status: AC

## 2011-05-03 MED ORDER — ONDANSETRON 8 MG PO TBDP
8.0000 mg | ORAL_TABLET | Freq: Once | ORAL | Status: AC
  Administered 2011-05-03: 8 mg via ORAL
  Filled 2011-05-03: qty 1

## 2011-05-03 MED ORDER — PROMETHAZINE HCL 25 MG PO TABS: ORAL_TABLET | ORAL | Status: DC

## 2011-05-03 MED ORDER — PROMETHAZINE HCL 25 MG PO TABS: 25.0000 mg | ORAL_TABLET | Freq: Four times a day (QID) | ORAL | Status: DC | PRN

## 2011-05-03 MED ORDER — M.V.I. ADULT IV INJ
10.0000 mL | Freq: Once | INTRAVENOUS | Status: AC
  Administered 2011-05-03: 10 mL via INTRAVENOUS
  Filled 2011-05-03: qty 10

## 2011-05-03 MED ORDER — PROMETHAZINE HCL 25 MG/ML IJ SOLN
25.0000 mg | Freq: Once | INTRAVENOUS | Status: AC
  Administered 2011-05-03: 25 mg via INTRAVENOUS
  Filled 2011-05-03: qty 1

## 2011-05-03 NOTE — ED Notes (Signed)
Reinforced diet and meds with husband.  Stressed to not let the pt get to the state she was in on arrival, she needs to come in before things get so bad.  Pt helped out in [facility], is moving better now than on arrival

## 2011-05-03 NOTE — ED Notes (Signed)
Moving better.Pt moved to recliner in the hall.  Due to unit need for rm and monitor for acute patien

## 2011-05-03 NOTE — Progress Notes (Signed)
Feeling really weak, haven't been able to sleep last 3 nights.  Can't relax.  Has been throwing up.

## 2011-05-03 NOTE — ED Notes (Signed)
Pt up to bathroom with assistance. Pt walking back - not standing up, continues to denie pain, states just feels weak.

## 2011-05-03 NOTE — ED Notes (Signed)
1st OB appt 11/11 at [facility] hosp [facility]

## 2011-05-03 NOTE — ED Notes (Signed)
Report from NT- pt of during orthostatic check, no complaints of dizziness

## 2011-05-03 NOTE — ED Provider Notes (Signed)
History     Chief Complaint  Patient presents with  . Fatigue   HPI Carla Haas 25 y.o.  Arrives at MAU via private vehicle.  Is very weak.  Assisted from wheelchair into bed.  Client hanging off side of bed.  In a very soft voice states she is nauseated and unable to eat.  Does not answer all questions.  Was at [facility] on 05-02-11 but records not available for access in EPIC.  No lab data available.  No previous chart info available.  States she has had nausea with previous pregnancies.  Youngest child is 95 months of age.   OB History    Grav Para Term Preterm Abortions TAB SAB Ect Mult Living   4 3 3  0 0 0 0 0 0 3      Past Medical History  Diagnosis Date  . Anemia   . Urinary tract infection   . Anxiety     Past Surgical History  Procedure Date  . Choley     Family History  Problem Relation Age of Onset  . Anesthesia problems Neg Hx     History  Substance Use Topics  . Smoking status: Never Smoker   . Smokeless tobacco: Never Used  . Alcohol Use: No    Allergies: No Known Allergies  Prescriptions prior to admission  Medication Sig Dispense Refill  . ferrous sulfate 325 (65 FE) MG tablet Take 325 mg by mouth daily.          Review of Systems  Gastrointestinal: Positive for nausea.   Physical Exam   Blood pressure 137/79, pulse 80, temperature 97.2 F (36.2 C), temperature source Oral, resp. rate 18, last menstrual period 03/10/2011.  Physical Exam  Nursing note and vitals reviewed. Constitutional: She appears well-developed and well-nourished.       Very weak  HENT:  Head: Normocephalic.  Eyes: EOM are normal.  Neck: Neck supple.  Musculoskeletal: Normal range of motion.  Skin: Skin is warm and dry.  Psychiatric:       Currently client is not talking.  Unable to fully assess    MAU Course  Procedures Will give IV fluids and draw labs.  Client unable to walk to bathroom for urinalysis  MDM reassessed at 12:36 PM - has had 2 bags of  fluids - one plain and LR with Phenergan 25 mg,  Client just went to the bathroom and did not collect urine specimen.  Walked with assistance bent over from the waist.  Answers questions with one-two word squeaks - very difficult to hear.  Partner is in the room and is helpful in answering questions.  Reassessed at 2:29 pm.  Has had 3.5 bags of fluid total.  Was able to walk to the bathroom upright.  Will discharge home.  Labs and ultrasound are normal.  Missed OB appointment in low risk clinic downstairs.  REscheduled for Nov 28 at 8 am.  Still talks so softly, I can hardly understand her.  Keeps face covered with sheets.  Partner accompanies her and thinks she is improved.    Assessment and Plan  Hyperemesis  Plan: Rescheduled appointment for new OB visit downstairs. Given prescriptions for Zofran, Phenergan tablets and suppositories.  Discussed using meds with client.  Very little feedback from her.  No questions voiced from her when asked. All vital signs and labs essentially normal. Will give work excuse for today only.  Leilyn Frayre 05/03/2011, 8:31 AM   Nolene Bernheim, NP 05/03/11 1433

## 2011-05-03 NOTE — ED Notes (Signed)
Was at [facility], there for several hours.  States ambulance started an IV, ER took it out- never gave fluids or did any testing.  Had to physically lift and move pt- pt barely talking will not assist with any movement

## 2011-05-03 NOTE — ED Notes (Signed)
Needing to leave to pick up kids per children- husband discussed withnP

## 2011-05-03 NOTE — ED Notes (Signed)
Pt up to bathroom, states did better this time.  Husband assist only

## 2011-05-04 NOTE — ED Provider Notes (Signed)
Attestation of Attending Supervision of Advanced Practitioner: Evaluation and management procedures were performed by the PA/NP/CNM/OB Fellow under my supervision/collaboration. Chart reviewed, and agree with management and plan.  Jaynie Collins A M.D. 05/04/2011 1:28 PM

## 2011-07-08 ENCOUNTER — Emergency Department (HOSPITAL_COMMUNITY)
Admission: EM | Admit: 2011-07-08 | Discharge: 2011-07-08 | Disposition: A | Discharge status: Home / Self Care | Payer: Self-pay | Source: Home / Self Care | Attending: Family Medicine | Admitting: Family Medicine

## 2011-08-22 ENCOUNTER — Encounter (HOSPITAL_COMMUNITY): Payer: Self-pay | Admitting: *Deleted

## 2011-08-22 ENCOUNTER — Inpatient Hospital Stay (HOSPITAL_COMMUNITY)
Admission: AD | Admit: 2011-08-22 | Discharge: 2011-08-23 | Disposition: A | Discharge status: Home / Self Care | Payer: Medicaid Other | Source: Ambulatory Visit | Attending: Family Medicine | Admitting: Family Medicine

## 2011-08-22 ENCOUNTER — Inpatient Hospital Stay (HOSPITAL_COMMUNITY)
Admission: AD | Admit: 2011-08-22 | Discharge: 2011-08-22 | Disposition: A | Discharge status: Home / Self Care | Payer: Medicaid Other | Source: Ambulatory Visit | Attending: Family Medicine | Admitting: Family Medicine

## 2011-08-22 ENCOUNTER — Other Ambulatory Visit: Payer: Self-pay

## 2011-08-22 ENCOUNTER — Encounter (HOSPITAL_COMMUNITY): Payer: Self-pay

## 2011-08-22 DIAGNOSIS — O210 Mild hyperemesis gravidarum: Secondary | ICD-10-CM | POA: Insufficient documentation

## 2011-08-22 DIAGNOSIS — O219 Vomiting of pregnancy, unspecified: Secondary | ICD-10-CM

## 2011-08-22 LAB — COMPREHENSIVE METABOLIC PANEL
AST: 20 U/L (ref 0–37)
Albumin: 3.7 g/dL (ref 3.5–5.2)
Alkaline Phosphatase: 33 U/L — ABNORMAL LOW (ref 39–117)
Alkaline Phosphatase: 38 U/L — ABNORMAL LOW (ref 39–117)
BUN: 5 mg/dL — ABNORMAL LOW (ref 6–23)
CO2: 23 mEq/L (ref 19–32)
Chloride: 102 mEq/L (ref 96–112)
Creatinine, Ser: 0.55 mg/dL (ref 0.50–1.10)
GFR calc Af Amer: >90 mL/min (ref >90)
Glucose, Bld: 84 mg/dL (ref 70–99)
Potassium: 3.2 mEq/L — ABNORMAL LOW (ref 3.5–5.1)
Potassium: 3.5 mEq/L (ref 3.5–5.1)
Total Bilirubin: 0.3 mg/dL (ref 0.3–1.2)
Total Bilirubin: 0.3 mg/dL (ref 0.3–1.2)
Total Protein: 6.9 g/dL (ref 6.0–8.3)

## 2011-08-22 LAB — URINALYSIS, ROUTINE W REFLEX MICROSCOPIC
Bilirubin Urine: NEGATIVE
Bilirubin Urine: NEGATIVE
Glucose, UA: >1000 mg/dL — AB
Glucose, UA: NEGATIVE mg/dL
Hgb urine dipstick: NEGATIVE
Hgb urine dipstick: NEGATIVE
Ketones, ur: >80 mg/dL — AB
Ketones, ur: NEGATIVE mg/dL
Protein, ur: NEGATIVE mg/dL
Protein, ur: NEGATIVE mg/dL
Urobilinogen, UA: 0.2 mg/dL (ref 0.0–1.0)

## 2011-08-22 LAB — CBC
HCT: 32.7 % — ABNORMAL LOW (ref 36.0–46.0)
HCT: 33.5 % — ABNORMAL LOW (ref 36.0–46.0)
Hemoglobin: 11 g/dL — ABNORMAL LOW (ref 12.0–15.0)
MCHC: 33.6 g/dL (ref 30.0–36.0)
MCV: 82.6 fL (ref 78.0–100.0)
Platelets: 354 10*3/uL (ref 150–400)
RBC: 4.09 MIL/uL (ref 3.87–5.11)
RDW: 15.6 % — ABNORMAL HIGH (ref 11.5–15.5)
RDW: 15.5 % (ref 11.5–15.5)
WBC: 10.5 10*3/uL (ref 4.0–10.5)

## 2011-08-22 LAB — POCT PREGNANCY, URINE: Preg Test, Ur: POSITIVE — AB

## 2011-08-22 LAB — URINE MICROSCOPIC-ADD ON

## 2011-08-22 LAB — GLUCOSE, CAPILLARY: Glucose-Capillary: 118 mg/dL — ABNORMAL HIGH (ref 70–99)

## 2011-08-22 MED ORDER — PROMETHAZINE HCL 25 MG PO TABS
12.5000 mg | ORAL_TABLET | ORAL | Status: AC
  Administered 2011-08-22: 12.5 mg via ORAL
  Filled 2011-08-22: qty 1

## 2011-08-22 MED ORDER — PROMETHAZINE HCL 12.5 MG PO TABS: 12.5000 mg | ORAL_TABLET | Freq: Four times a day (QID) | ORAL | Status: DC | PRN

## 2011-08-22 MED ORDER — DEXTROSE 5 % IN LACTATED RINGERS IV BOLUS
1000.0000 mL | Freq: Once | INTRAVENOUS | Status: AC
  Administered 2011-08-22: 1000 mL via INTRAVENOUS

## 2011-08-22 MED ORDER — PRENATAL RX 60-1 MG PO TABS: 1.0000 | ORAL_TABLET | Freq: Every day | ORAL | Status: DC

## 2011-08-22 NOTE — Progress Notes (Signed)
Quick cath collected.  Pt responding appropriately to questions at this time.  Will not open eyes or talk without being questioned.  Significant other at bedside. Instructed him to press call light with any concerns.

## 2011-08-22 NOTE — ED Provider Notes (Signed)
Carla Haas 45 @[redacted]w[redacted]d  with N/V of pregnancy.  SUBJECTIVE  HPI: Pt presents to MAU with report of N/V for 1 week, unable to keep any food down.  She reports similar symptoms with her previous pregnancies.  She had a miscarriage in November and had one normal menses in December, before finding out she was pregnant again.  She has applied for Medicaid a few weeks ago and plans to begin prenatal care as soon as this comes through.  She denies pain, LOF, vaginal bleeding, vaginal discharge/burning/itching, urinary symptoms, h/a, dizziness, or fever/chills.   Past Medical History  Diagnosis Date  . Anemia   . Urinary tract infection   . Anxiety    Past Surgical History  Procedure Date  . Cholecystectomy     age 40  . Choley    History   Social History  . Marital Status: Single    Spouse Name: N/A    Number of Children: N/A  . Years of Education: N/A   Occupational History  . Not on file.   Social History Main Topics  . Smoking status: Never Smoker   . Smokeless tobacco: Never Used  . Alcohol Use: No  . Drug Use: No  . Sexually Active: Yes    Birth Control/ Protection: None   Other Topics Concern  . Not on file   Social History Narrative   ** Merged History Encounter **    No current facility-administered medications on file prior to encounter.   Current Outpatient Prescriptions on File Prior to Encounter  Medication Sig Dispense Refill  . ferrous sulfate 325 (65 FE) MG tablet Take 325 mg by mouth daily.         No Known Allergies  ROS: Pertinent items in HPI  OBJECTIVE Blood pressure 104/72, pulse 61, temperature 97.9 F (36.6 C), temperature source Oral, resp. rate 16, height 4' 11.5" (1.511 m), weight 43.545 kg (96 lb), last menstrual period 05/27/2011, unknown if currently breastfeeding. GENERAL: Well-developed, well-nourished female in no acute distress.  LUNGS: Clear to auscultation bilaterally.  HEART: Regular rate and rhythm. ABDOMEN:  Soft, nontender EXTREMITIES: Nontender, no edema Pelvic and cervical exam deferred  FHT 150 by doppler  RESULTS Results for orders placed during the hospital encounter of 08/22/11 (from the past 24 hour(s))  URINALYSIS, ROUTINE W REFLEX MICROSCOPIC     Status: Normal   Collection Time   08/22/11 12:10 PM      Component Value Range   Color, Urine YELLOW  YELLOW    APPearance CLEAR  CLEAR    Specific Gravity, Urine 1.025  1.005 - 1.030    pH 6.0  5.0 - 8.0    Glucose, UA NEGATIVE  NEGATIVE (mg/dL)   Hgb urine dipstick NEGATIVE  NEGATIVE    Bilirubin Urine NEGATIVE  NEGATIVE    Ketones, ur NEGATIVE  NEGATIVE (mg/dL)   Protein, ur NEGATIVE  NEGATIVE (mg/dL)   Urobilinogen, UA 0.2  0.0 - 1.0 (mg/dL)   Nitrite NEGATIVE  NEGATIVE    Leukocytes, UA NEGATIVE  NEGATIVE   POCT PREGNANCY, URINE     Status: Abnormal   Collection Time   08/22/11 12:15 PM      Component Value Range   Preg Test, Ur POSITIVE (*) NEGATIVE   CBC     Status: Abnormal   Collection Time   08/22/11  1:25 PM      Component Value Range   WBC 5.2  4.0 - 10.5 (K/uL)   RBC 3.96  3.87 -  5.11 (MIL/uL)   Hemoglobin 11.0 (*) 12.0 - 15.0 (g/dL)   HCT 29.5 (*) 28.4 - 46.0 (%)   MCV 82.6  78.0 - 100.0 (fL)   MCH 27.8  26.0 - 34.0 (pg)   MCHC 33.6  30.0 - 36.0 (g/dL)   RDW 13.2 (*) 44.0 - 15.5 (%)   Platelets 298  150 - 400 (K/uL)  COMPREHENSIVE METABOLIC PANEL     Status: Abnormal   Collection Time   08/22/11  1:25 PM      Component Value Range   Sodium 135  135 - 145 (mEq/L)   Potassium 3.2 (*) 3.5 - 5.1 (mEq/L)   Chloride 104  96 - 112 (mEq/L)   CO2 24  19 - 32 (mEq/L)   Glucose, Bld 84  70 - 99 (mg/dL)   BUN 5 (*) 6 - 23 (mg/dL)   Creatinine, Ser 1.02  0.50 - 1.10 (mg/dL)   Calcium 8.7  8.4 - 72.5 (mg/dL)   Total Protein 6.9  6.0 - 8.3 (g/dL)   Albumin 3.3 (*) 3.5 - 5.2 (g/dL)   AST 12  0 - 37 (U/L)   ALT 11  0 - 35 (U/L)   Alkaline Phosphatase 33 (*) 39 - 117 (U/L)   Total Bilirubin 0.3  0.3 - 1.2 (mg/dL)    GFR calc non Af Amer >90  >90 (mL/min)   GFR calc Af Amer >90  >90 (mL/min)   IMAGING Not indicated  MANAGEMENT Phenergan 12.5 mg PO given in MAU.  Pt able to keep down sips of fluid following medication administration.  ASSESSMENT N/V of pregnancy  PLAN D/C  Home Phenergan 12.5 mg PO Q 6 hours as needed Prenatal vitamin daily, if tolerated following phenergan Pt given list of prenatal providers and encouraged to begin prenatal care as soon as possible

## 2011-08-22 NOTE — ED Provider Notes (Signed)
History     Chief Complaint  Patient presents with  . Morning Sickness   HPI 26 y.o. [id number] at [redacted]w[redacted]d here with nausea/vomiting and syncope. Brought in by boyfriend, states she lost consciousness on after arrival to MAU. Pt is slumped over in wheelchair with eyes closed, arouses with smelling salts, will respond to questions. Was seen earlier today in MAU with N/V, able to tolerate PO after Phenergan 12.5 mg PO. Sent home with rx for Phenergan. When asked if she got her prescription filled after she was discharged, she states "it never works" but also states that she did not take any medication after leaving MAU. Reports vomiting all day today, unable to keep anything down, but tried to eat a hamburger. States she has abdominal pain, "my muscles get all tight when I vomit and it hurts", no pain when not vomiting. Boyfriend states that she "does this with all of her pregnancies".    Past Medical History  Diagnosis Date  . Anemia   . Urinary tract infection   . Anxiety     Past Surgical History  Procedure Date  . Cholecystectomy     age 13  . Choley     Family History  Problem Relation Age of Onset  . Anesthesia problems Neg Hx     History  Substance Use Topics  . Smoking status: Never Smoker   . Smokeless tobacco: Never Used  . Alcohol Use: No    Allergies: No Known Allergies  Prescriptions prior to admission  Medication Sig Dispense Refill  . ferrous sulfate 325 (65 FE) MG tablet Take 325 mg by mouth daily.        . Prenatal Vit-Fe Fumarate-FA (PRENATAL MULTIVITAMIN) 60-1 MG tablet Take 1 tablet by mouth daily. Prenatal vitamin with at least 27 mg iron, 400 mcg folic acid.  DHA if insurance covers.  30 tablet  5  . promethazine (PHENERGAN) 12.5 MG tablet Take 1 tablet (12.5 mg total) by mouth every 6 (six) hours as needed for nausea.  30 tablet  0    Review of Systems  Respiratory: Negative.   Cardiovascular: Negative.   Gastrointestinal: Positive for nausea, vomiting  and abdominal pain. Negative for diarrhea and constipation.  Genitourinary: Negative for dysuria, urgency, frequency, hematuria and flank pain.       Negative for vaginal bleeding, vaginal discharge  Musculoskeletal: Negative.   Neurological: Positive for dizziness, loss of consciousness and weakness.  Psychiatric/Behavioral: Negative.    Physical Exam   Blood pressure 108/56, pulse 100, resp. rate 16, height 4' 11.5" (1.511 m), weight 96 lb (43.545 kg), last menstrual period 05/27/2011, SpO2 100.00%, unknown if currently breastfeeding.  Physical Exam  Nursing note and vitals reviewed. Constitutional: She is oriented to person, place, and time. She appears well-developed. She appears lethargic. Distressed: pt slumped with eyes closed, but reponds to questioning.  HENT:  Head: Normocephalic and atraumatic.  Cardiovascular: Normal rate, regular rhythm and normal heart sounds.   Respiratory: Effort normal and breath sounds normal. No respiratory distress. She has no wheezes. She has no rales.  GI: Soft. She exhibits no mass. There is tenderness (diffuse). There is no rebound and no guarding.  Neurological: She is oriented to person, place, and time. She appears lethargic. No cranial nerve deficit.  Skin: Skin is warm and dry. She is not diaphoretic.    MAU Course  Procedures  Results for orders placed during the hospital encounter of 08/22/11 (from the past 24 hour(s))  GLUCOSE, CAPILLARY  Status: Abnormal   Collection Time   08/22/11 11:04 PM      Component Value Range   Glucose-Capillary 118 (*) 70 - 99 (mg/dL)   Comment 1 Notify RN    CBC     Status: Abnormal   Collection Time   08/22/11 11:10 PM      Component Value Range   WBC 10.5  4.0 - 10.5 (K/uL)   RBC 4.09  3.87 - 5.11 (MIL/uL)   Hemoglobin 11.6 (*) 12.0 - 15.0 (g/dL)   HCT 04.5 (*) 40.9 - 46.0 (%)   MCV 81.9  78.0 - 100.0 (fL)   MCH 28.4  26.0 - 34.0 (pg)   MCHC 34.6  30.0 - 36.0 (g/dL)   RDW 81.1  91.4 - 78.2 (%)    Platelets 354  150 - 400 (K/uL)  COMPREHENSIVE METABOLIC PANEL     Status: Abnormal   Collection Time   08/22/11 11:10 PM      Component Value Range   Sodium 136  135 - 145 (mEq/L)   Potassium 3.5  3.5 - 5.1 (mEq/L)   Chloride 102  96 - 112 (mEq/L)   CO2 23  19 - 32 (mEq/L)   Glucose, Bld 125 (*) 70 - 99 (mg/dL)   BUN 7  6 - 23 (mg/dL)   Creatinine, Ser 9.56  0.50 - 1.10 (mg/dL)   Calcium 9.2  8.4 - 21.3 (mg/dL)   Total Protein 7.2  6.0 - 8.3 (g/dL)   Albumin 3.7  3.5 - 5.2 (g/dL)   AST 20  0 - 37 (U/L)   ALT 17  0 - 35 (U/L)   Alkaline Phosphatase 38 (*) 39 - 117 (U/L)   Total Bilirubin 0.3  0.3 - 1.2 (mg/dL)   GFR calc non Af Amer >90  >90 (mL/min)   GFR calc Af Amer >90  >90 (mL/min)  URINALYSIS, ROUTINE W REFLEX MICROSCOPIC     Status: Abnormal   Collection Time   08/22/11 11:35 PM      Component Value Range   Color, Urine YELLOW  YELLOW    APPearance CLEAR  CLEAR    Specific Gravity, Urine 1.015  1.005 - 1.030    pH 6.5  5.0 - 8.0    Glucose, UA >1000 (*) NEGATIVE (mg/dL)   Hgb urine dipstick NEGATIVE  NEGATIVE    Bilirubin Urine NEGATIVE  NEGATIVE    Ketones, ur >80 (*) NEGATIVE (mg/dL)   Protein, ur NEGATIVE  NEGATIVE (mg/dL)   Urobilinogen, UA 0.2  0.0 - 1.0 (mg/dL)   Nitrite NEGATIVE  NEGATIVE    Leukocytes, UA NEGATIVE  NEGATIVE   URINE MICROSCOPIC-ADD ON     Status: Abnormal   Collection Time   08/22/11 11:35 PM      Component Value Range   Squamous Epithelial / LPF RARE  RARE    WBC, UA 0-2  <3 (WBC/hpf)   RBC / HPF 0-2  <3 (RBC/hpf)   Bacteria, UA FEW (*) RARE    Urine-Other MUCOUS PRESENT     Following IV hydration and Zofran 4 mg IV, pt able to ambulate to bathroom on her own. Had one episode of a small amount of emesis prior to Zofran, no vomiting now. States "the medicine is not working", still nauseous.   EKG - rev'd by E-link MD, ok, no further workup needed at this time  Assessment and Plan  25 y.o. [id number] at [redacted]w[redacted]d N/V in pregnancy - rx Zofran  8 mg ODT and  Phenergan 25 mg PO Rev'd precautions, f/u as scheduled  Carla Haas 08/22/2011, 11:21 PM

## 2011-08-22 NOTE — ED Provider Notes (Signed)
Chart reviewed and agree with management and plan.  

## 2011-08-22 NOTE — Discharge Instructions (Signed)
Nausea/Vomiting (Morning Sickness) in Pregnancy Morning sickness is when you feel sick to your stomach (nauseous) during pregnancy. This nauseous feeling may or may not come with throwing up (vomiting). It often occurs in the morning, but can be a problem any time of day. While morning sickness is unpleasant, it is usually harmless unless you develop severe and continual vomiting (hyperemesis gravidarum). This condition requires more intense treatment. CAUSES  The cause of morning sickness is not completely known but seems to be related to a sudden increase of two hormones:   Human chorionic gonadotropin (hCG).   Estrogen hormone.  These are elevated in the first part of the pregnancy. TREATMENT  Do not use any medicines (prescription, over-the-counter, or herbal) for morning sickness without first talking to your caregiver. Some patients are helped by the following:  Vitamin B6 (25mg  every 8 hours) or vitamin B6 shots.   An antihistamine called doxylamine (Take 1/2 a Unisom tablet every night at bedtime).   The herbal medication ginger.  HOME CARE INSTRUCTIONS   Taking multivitamins before getting pregnant can prevent or decrease the severity of morning sickness in most women.   Eat a piece of dry toast or unsalted crackers before getting out of bed in the morning.   Eat 5 or 6 small meals a day.   Eat dry and bland foods (rice, baked potato).   Do not drink liquids with your meals. Drink liquids between meals.   Avoid greasy, fatty, and spicy foods.   Get someone to cook for you if the smell of any food causes nausea and vomiting.   Avoid vitamin pills with iron because iron can cause nausea.   Snack on protein foods between meals if you are hungry.   Eat unsweetened gelatins for deserts.   Wear an acupressure wristband (worn for sea sickness) may be helpful.   Acupuncture may be helpful.   Do not smoke.   Get a humidifier to keep the air in your house free of odors.    SEEK MEDICAL CARE IF:   Your home remedies are not working and you need medication.   You feel dizzy or lightheaded.   You are losing weight.   You need help with your diet.  SEEK IMMEDIATE MEDICAL CARE IF:   You have persistent and uncontrolled nausea and vomiting.   You pass out (faint).   You have a fever.  MAKE SURE YOU:   Understand these instructions.   Will watch your condition.   Will get help right away if you are not doing well or get worse.  Document Released: 08/03/2006 Document Revised: 02/22/2011 Document Reviewed: 05/31/2007 [facility] Patient Information 2012 [place], [place].

## 2011-08-22 NOTE — Progress Notes (Signed)
Pt reports n/v for 2 weeks. States hasn't been able to keep food down for "a while". Denies vaginal bleeding or vaginal discharge. States had a miscarriage in November then found out she was pregnant again in December. States LMP was sometime in December but doesn't know when.

## 2011-08-22 NOTE — Progress Notes (Signed)
Pt brought backk to triage room from the car in a w/chair-husband sttes the pt passed out at home-she appears to be passed out now-making small groaning noised but does not reposnd-taken to a strtecher at the end of the hall-N.Bascom Levels in to see pt

## 2011-08-23 ENCOUNTER — Encounter (HOSPITAL_COMMUNITY): Payer: Self-pay | Admitting: *Deleted

## 2011-08-23 LAB — RAPID URINE DRUG SCREEN, HOSP PERFORMED
Cocaine: NOT DETECTED
Opiates: NOT DETECTED
Tetrahydrocannabinol: NOT DETECTED

## 2011-08-23 MED ORDER — ONDANSETRON HCL 4 MG/2ML IJ SOLN
4.0000 mg | Freq: Once | INTRAMUSCULAR | Status: AC
  Administered 2011-08-23: 4 mg via INTRAVENOUS
  Filled 2011-08-23: qty 2

## 2011-08-23 MED ORDER — ONDANSETRON 8 MG PO TBDP: 8.0000 mg | ORAL_TABLET | Freq: Three times a day (TID) | ORAL | Status: DC | PRN

## 2011-08-23 MED ORDER — PROMETHAZINE HCL 25 MG PO TABS: 12.5000 mg | ORAL_TABLET | Freq: Four times a day (QID) | ORAL | Status: DC | PRN

## 2011-08-23 NOTE — Progress Notes (Signed)
Pt ambulated to bathroom with assistance.  No s/s of distress noted.

## 2011-08-23 NOTE — ED Provider Notes (Signed)
Chart reviewed and agree with management and plan.  

## 2011-08-25 ENCOUNTER — Observation Stay (HOSPITAL_COMMUNITY)
Admission: EM | Admit: 2011-08-25 | Discharge: 2011-08-26 | Disposition: A | Discharge status: Home Health | Payer: Medicaid Other | Attending: Emergency Medicine | Admitting: Emergency Medicine

## 2011-08-25 ENCOUNTER — Encounter (HOSPITAL_COMMUNITY): Payer: Self-pay | Admitting: Cardiology

## 2011-08-25 DIAGNOSIS — O210 Mild hyperemesis gravidarum: Principal | ICD-10-CM

## 2011-08-25 LAB — URINE MICROSCOPIC-ADD ON

## 2011-08-25 LAB — URINALYSIS, ROUTINE W REFLEX MICROSCOPIC
Glucose, UA: NEGATIVE mg/dL
Hgb urine dipstick: NEGATIVE
Ketones, ur: >80 mg/dL — AB
Leukocytes, UA: NEGATIVE
Nitrite: NEGATIVE
Protein, ur: 30 mg/dL — AB
Specific Gravity, Urine: 1.029 (ref 1.005–1.030)
Urobilinogen, UA: 1 mg/dL (ref 0.0–1.0)
pH: 6 (ref 5.0–8.0)

## 2011-08-25 LAB — DIFFERENTIAL
Basophils Absolute: 0 10*3/uL (ref 0.0–0.1)
Basophils Relative: 0 % (ref 0–1)
Eosinophils Absolute: 0 10*3/uL (ref 0.0–0.7)
Eosinophils Relative: 0 % (ref 0–5)
Lymphocytes Relative: 14 % (ref 12–46)
Lymphs Abs: 1.5 10*3/uL (ref 0.7–4.0)
Monocytes Absolute: 0.5 10*3/uL (ref 0.1–1.0)
Monocytes Relative: 5 % (ref 3–12)
Neutro Abs: 8.9 10*3/uL — ABNORMAL HIGH (ref 1.7–7.7)
Neutrophils Relative %: 82 % — ABNORMAL HIGH (ref 43–77)

## 2011-08-25 LAB — BASIC METABOLIC PANEL
Calcium: 9.6 mg/dL (ref 8.4–10.5)
GFR calc Af Amer: >90 mL/min (ref >90)
GFR calc non Af Amer: >90 mL/min (ref >90)
Potassium: 3.5 mEq/L (ref 3.5–5.1)
Sodium: 138 mEq/L (ref 135–145)

## 2011-08-25 LAB — CBC
Hemoglobin: 12.8 g/dL (ref 12.0–15.0)
Platelets: 334 10*3/uL (ref 150–400)
RBC: 4.42 MIL/uL (ref 3.87–5.11)
WBC: 10.9 10*3/uL — ABNORMAL HIGH (ref 4.0–10.5)

## 2011-08-25 MED ORDER — ONDANSETRON 4 MG PO TBDP: 4.0000 mg | ORAL_TABLET | Freq: Three times a day (TID) | ORAL | Status: AC | PRN

## 2011-08-25 MED ORDER — SODIUM CHLORIDE 0.9 % IV BOLUS (SEPSIS)
500.0000 mL | Freq: Once | INTRAVENOUS | Status: AC
  Administered 2011-08-25: 500 mL via INTRAVENOUS

## 2011-08-25 MED ORDER — ONDANSETRON HCL 4 MG/2ML IJ SOLN
4.0000 mg | Freq: Once | INTRAMUSCULAR | Status: AC
  Administered 2011-08-25: 4 mg via INTRAVENOUS
  Filled 2011-08-25: qty 2

## 2011-08-25 MED ORDER — SODIUM CHLORIDE 0.9 % IV SOLN
Freq: Once | INTRAVENOUS | Status: AC
  Administered 2011-08-25: 13:00:00 via INTRAVENOUS

## 2011-08-25 MED ORDER — ACETAMINOPHEN 325 MG PO TABS: 650.0000 mg | ORAL_TABLET | ORAL | Status: DC | PRN

## 2011-08-25 MED ORDER — SODIUM CHLORIDE 0.9 % IV SOLN
INTRAVENOUS | Status: DC
  Administered 2011-08-25: 14:00:00 via INTRAVENOUS

## 2011-08-25 MED ORDER — FAMOTIDINE 20 MG PO TABS
20.0000 mg | ORAL_TABLET | Freq: Once | ORAL | Status: AC
  Administered 2011-08-25: 20 mg via ORAL
  Filled 2011-08-25: qty 1

## 2011-08-25 MED ORDER — SODIUM CHLORIDE 0.9 % IV SOLN: INTRAVENOUS | Status: DC

## 2011-08-25 MED ORDER — RANITIDINE HCL 150 MG/10ML PO SYRP: 150.0000 mg | ORAL_SOLUTION | Freq: Once | ORAL | Status: DC

## 2011-08-25 MED ORDER — PROMETHAZINE HCL 25 MG/ML IJ SOLN
12.5000 mg | INTRAMUSCULAR | Status: DC
  Filled 2011-08-25: qty 1

## 2011-08-25 MED ORDER — ONDANSETRON HCL 4 MG/2ML IJ SOLN
4.0000 mg | Freq: Four times a day (QID) | INTRAMUSCULAR | Status: DC | PRN
  Administered 2011-08-25 (×2): 4 mg via INTRAVENOUS
  Filled 2011-08-25 (×2): qty 2

## 2011-08-25 MED ORDER — METOCLOPRAMIDE HCL 5 MG/ML IJ SOLN
10.0000 mg | Freq: Once | INTRAMUSCULAR | Status: AC
  Administered 2011-08-25: 10 mg via INTRAVENOUS
  Filled 2011-08-25: qty 2

## 2011-08-25 NOTE — ED Notes (Signed)
PT has been able to tolerate 200 ml of water without vomiting.  NP Katrinka Blazing notified.

## 2011-08-25 NOTE — ED Notes (Signed)
PT states she is feeling better and feels she can go home.  She has tolerated liquid challenge.  PA Smith notified.

## 2011-08-25 NOTE — ED Notes (Signed)
PT states she continues to feel nauseated, though improving.  Asked for ice chips to see if she can tolerate.

## 2011-08-25 NOTE — Discharge Instructions (Signed)
Hyperemesis Gravidarum Hyperemesis gravidarum is a severe form of nausea and vomiting that happens during pregnancy. Hyperemesis is worse than morning sickness. It may cause a woman to have nausea or vomiting all day for many days. It may keep a woman from eating and drinking enough food and liquids. Hyperemesis usually occurs during the first half (the first 20 weeks) of pregnancy. It often goes away once a woman is in her second half of pregnancy. However, sometimes hyperemesis continues through an entire pregnancy.  CAUSES  The cause of this condition is not completely known but is thought to be due to changes in the body's hormones when pregnant. It could be the high level of the pregnancy hormone or an increase in estrogen in the body.  SYMPTOMS   Severe nausea and vomiting.   Nausea that does not go away.   Vomiting that does not allow you to keep any food down.   Weight loss and body fluid loss (dehydration).   Having no desire to eat or not liking food you have previously enjoyed.  DIAGNOSIS  Your caregiver may ask you about your symptoms. Your caregiver may also order blood tests and urine tests to make sure something else is not causing the problem.  TREATMENT  You may only need medicine to control the problem. If medicines do not control the nausea and vomiting, you will be treated in the hospital to prevent dehydration, acidosis, weight loss, and changes in the electrolytes in your body that may harm the unborn baby (fetus). You may need intravenous (IV) fluids.  HOME CARE INSTRUCTIONS   Take all medicine as directed by your caregiver.   Try eating a couple of dry crackers or toast in the morning before getting out of bed.   Avoid foods and smells that upset your stomach.   Avoid fatty and spicy foods. Eat 5 to 6 small meals a day.   Do not drink when eating meals. Drink between meals.   For snacks, eat high protein foods, such as cheese. Eat or suck on things that have  ginger in them. Ginger helps nausea.   Avoid food preparation. The smell of food can spoil your appetite.   Avoid iron pills and iron in your multivitamins until after 3 to 4 months of being pregnant.  SEEK MEDICAL CARE IF:   Your abdominal pain increases since the last time you saw your caregiver.   You have a severe headache.   You develop vision problems.   You feel you are losing weight.  SEEK IMMEDIATE MEDICAL CARE IF:   You are unable to keep fluids down.   You vomit blood.   You have constant nausea and vomiting.   You have a fever.   You have excessive weakness, dizziness, fainting, or extreme thirst.  MAKE SURE YOU:   Understand these instructions.   Will watch your condition.   Will get help right away if you are not doing well or get worse.  Document Released: 06/12/2005 Document Revised: 02/22/2011 Document Reviewed: 09/12/2010 [employer] Patient Information 2012 [employer]. 

## 2011-08-25 NOTE — ED Notes (Signed)
Transferred to CDU

## 2011-08-25 NOTE — ED Provider Notes (Signed)
Patient in CDU under hyperemesis gravidarum protocol.  This is the patient's third visit to the health system this week for persistent nausea and vomiting associated with pregnancy.  Patient is feeling better after fluids and medications.  Currently tolerating po fluids without difficulty.  Resting comfortably at present.  Patient states she feels like she is ready to return home.  Lungs CTA bilaterally.  S1/S2, RRR.  Abdomen soft, bowel sounds present.  Patient will be discharged home with prescription for zofran to add to current treatment regimen.  Jimmye Norman, NP 08/25/11 2259

## 2011-08-25 NOTE — ED Provider Notes (Signed)
History     CSN: [id number]  Arrival date & time 08/25/11  0904   First MD Initiated Contact with Patient 08/25/11 [phone]      Chief Complaint  Patient presents with  . Emesis During Pregnancy    (Consider location/radiation/quality/duration/timing/severity/associated sxs/prior treatment) HPI Pt is a 26 y/o [id number] [redacted]w[redacted]d who presents with c/c of persistent N/V. Seen at [facility]U for same twice on 2/26 without change in symptoms. Reportedly unable to keep down liquids or solids x 3 days. Has taken phenergan x 4 doses without relief. States she vomits approx 5 minutes after consuming any fluids. Reports decreased UOP. Denies fever, chills, congestion, cough, sore throat, CP, SOB, abd pain, dysuria, diarrhea, syncope, or weakness. Has not received prenatal care yet for this pregnancy- previous pregnancies followed by Dr Gaynell Face but she would like to go to the women's clinic for this pregnancy.  Past Medical History  Diagnosis Date  . Anemia   . Urinary tract infection   . Anxiety     Past Surgical History  Procedure Date  . Cholecystectomy     age 26  . Choley     Family History  Problem Relation Age of Onset  . Anesthesia problems Neg Hx     History  Substance Use Topics  . Smoking status: Never Smoker   . Smokeless tobacco: Never Used  . Alcohol Use: No    OB History    Grav Para Term Preterm Abortions TAB SAB Ect Mult Living   5 3 3  0 1 0 1 0 0 3      Review of Systems 10 systems reviewed and are negative for acute change except as noted in the HPI.  Allergies  Review of patient's allergies indicates no known allergies.  Home Medications   Current Outpatient Rx  Name Route Sig Dispense Refill  . FERROUS SULFATE 325 (65 FE) MG PO TABS Oral Take 325 mg by mouth daily.      Marland Kitchen PROMETHAZINE HCL 25 MG PO TABS Oral Take 0.5-1 tablets (12.5-25 mg total) by mouth every 6 (six) hours as needed for nausea. 30 tablet 2    BP 129/88  Pulse 81  Temp(Src)  98.3 F (36.8 C) (Oral)  Resp 15  SpO2 99%  LMP 05/27/2011  Physical Exam  Constitutional: She is oriented to person, place, and time. She appears well-developed and well-nourished.       Lying on stretcher, curled into a ball.  HENT:  Head: Normocephalic and atraumatic.  Right Ear: External ear normal.  Left Ear: External ear normal.       Mucous membranes dry  Eyes: Pupils are equal, round, and reactive to light.  Neck: Normal range of motion. Neck supple.  Cardiovascular: Normal rate, regular rhythm and normal heart sounds.   No murmur heard. Pulmonary/Chest: Effort normal and breath sounds normal. No respiratory distress. She has no wheezes. She exhibits no tenderness.  Abdominal: Soft. Bowel sounds are normal. She exhibits no distension. There is tenderness (mild generalized tenderness). There is no rigidity, no rebound and no guarding.  Musculoskeletal: She exhibits no edema and no tenderness.  Neurological: She is alert and oriented to person, place, and time. No cranial nerve deficit.  Skin: Skin is warm and dry. No rash noted.    ED Course  Procedures (including critical care time)  Labs Reviewed  CBC - Abnormal; Notable for the following:    WBC 10.9 (*)    HCT 35.4 (*)  MCHC 36.2 (*)    All other components within normal limits  DIFFERENTIAL - Abnormal; Notable for the following:    Neutrophils Relative 82 (*)    Neutro Abs 8.9 (*)    All other components within normal limits  URINALYSIS, ROUTINE W REFLEX MICROSCOPIC - Abnormal; Notable for the following:    Bilirubin Urine SMALL (*)    Ketones, ur >80 (*)    Protein, ur 30 (*)    All other components within normal limits  BASIC METABOLIC PANEL  URINE MICROSCOPIC-ADD ON   No results found.   1. Hyperemesis gravidarum       MDM  G5P3 with hx HG, presents with same. U/a, CBC, BMP reviewed. Urine with evidence decreased nutrition, no sig dehydration as SG upper limit normal, BUN/SCr normal and close to  pt baseline. Will place on CDU hyperemesis protocol. Discussed pt care with NP Okey Dupre, who will continue to follow. Hope to d/c home once symptoms are under better control.        Shaaron Adler, PA-C 08/25/11 1311

## 2011-08-25 NOTE — ED Notes (Signed)
Pt is in gown

## 2011-08-25 NOTE — ED Notes (Signed)
Pt wants something for burping. PA made aware, order given.

## 2011-08-25 NOTE — ED Provider Notes (Signed)
Carla Haas is a 26 y.o. female who has had abdominal pain for one week with nausea and vomiting. This despite being seen and treated twice on 08/22/2011 at the [facility]U. She has had no prenatal care for this 12 week pregnancy. She previously had hyperemesis gravidarum with prior pregnancies. She has failed outpatient treatment with oral Phenergan. She denies vaginal bleeding or discharge. She's not had her pregnancy dates were verified. Heart regular rate and rhythm. No murmur. Lungs clear. Bowel sounds hyperactive nontender. No mass.  She'll placed in the CDU on protocol for hyperemesis gravidarum.  Medical screening examination/treatment/procedure(s) were conducted as a shared visit with non-physician practitioner(s) and myself.  I personally evaluated the patient during the encounter  Flint Melter, MD 08/25/11 2055

## 2011-08-25 NOTE — ED Notes (Signed)
Pt undressed, totally from waist down, on continuous pulse oximetry and blood pressure cuff; 2 warm blankets given; family at bedside

## 2011-08-25 NOTE — Progress Notes (Signed)
Observation review is complete. 

## 2011-08-25 NOTE — ED Notes (Signed)
Patient given discharge paperwork; went over discharge instructions with patient.  Instructed patient to take Zofran as directed, to follow up with her primary care physician, and to return to the ED for new, worsening, or concerning symptoms.

## 2011-08-25 NOTE — ED Notes (Addendum)
Pt states feeling a little better. Pt aware that staff will conduct another PO trial soon in anticipation of d/c.

## 2011-08-25 NOTE — ED Notes (Signed)
Pt to department c/o vomiting with her pregnancy. Reports that she has been to women[facility] the past 2 days and given Rx but that its not helping. Husband reports that she has been unable to keep anything down for the past 3 days. Pt reports decreased urination.

## 2011-08-25 NOTE — ED Notes (Signed)
PO trial to be attempted

## 2011-08-25 NOTE — ED Provider Notes (Signed)
History     CSN: [id number]  Arrival date & time 08/25/11  0904   First MD Initiated Contact with Patient 08/25/11 [phone]      Chief Complaint  Patient presents with  . Emesis During Pregnancy    (Consider location/radiation/quality/duration/timing/severity/associated sxs/prior treatment) HPI  Past Medical History  Diagnosis Date  . Anemia   . Urinary tract infection   . Anxiety     Past Surgical History  Procedure Date  . Cholecystectomy     age 26  . Choley     Family History  Problem Relation Age of Onset  . Anesthesia problems Neg Hx     History  Substance Use Topics  . Smoking status: Never Smoker   . Smokeless tobacco: Never Used  . Alcohol Use: No    OB History    Grav Para Term Preterm Abortions TAB SAB Ect Mult Living   5 3 3  0 1 0 1 0 0 3      Review of Systems  Allergies  Review of patient's allergies indicates no known allergies.  Home Medications   Current Outpatient Rx  Name Route Sig Dispense Refill  . FERROUS SULFATE 325 (65 FE) MG PO TABS Oral Take 325 mg by mouth daily.      Marland Kitchen PROMETHAZINE HCL 25 MG PO TABS Oral Take 0.5-1 tablets (12.5-25 mg total) by mouth every 6 (six) hours as needed for nausea. 30 tablet 2    BP 115/88  Pulse 89  Temp(Src) 98.3 F (36.8 C) (Oral)  Resp 15  SpO2 100%  LMP 05/27/2011  Physical Exam  ED Course  Procedures (including critical care time)  Labs Reviewed  CBC - Abnormal; Notable for the following:    WBC 10.9 (*)    HCT 35.4 (*)    MCHC 36.2 (*)    All other components within normal limits  DIFFERENTIAL - Abnormal; Notable for the following:    Neutrophils Relative 82 (*)    Neutro Abs 8.9 (*)    All other components within normal limits  URINALYSIS, ROUTINE W REFLEX MICROSCOPIC - Abnormal; Notable for the following:    Bilirubin Urine SMALL (*)    Ketones, ur >80 (*)    Protein, ur 30 (*)    All other components within normal limits  BASIC METABOLIC PANEL  URINE MICROSCOPIC-ADD  ON   No results found.   No diagnosis found.    MDM  12:30pm  report received from [place]. 26 year old [redacted] weeks pregnant with hyperemesis today. She was at women's 2 days ago with the same. Tried Phenergan x4 at home but continued to vomit. She hasn't had Zofran in the ER today. We will proceed to hydrate and try P. is until we can disposition the patient. CBC been met and urinalysis today are normal. Patient does have protien in the urine but no hypertension or lower extremity edema.   1300 patient c/o   right upper back pain. States this is the same pain she had with her last pregnancy. States that she had a lot of  nausea and vomiting as well with her last pregnancy. Denies vaginal discharge or lower abdominal pain. Denies vaginal bleeding.  Friend at bedside and requesting that we give her a note so she can get out of work for the remaining pregnancy.  States she used to go to Dr. Gaynell Face but she has not OBGYN presently. Came here because [facility] will not "do anything for her" .  Thrashing about  in the bed.  Holding her back.  Turning over and over.  No eye contact.  Took Ibuprofen for pain at home.  1600 Report given to Onalee Hua PA who will disposition patient when feeling better.  Or he will admit if she does not feel better.        Jethro Bastos, NP 08/27/11 1319

## 2011-08-25 NOTE — ED Notes (Signed)
Ambulatory to BR without difficulty.  UA obtained and sent to lab.

## 2011-08-26 NOTE — ED Provider Notes (Signed)
Medical screening examination/treatment/procedure(s) were performed by non-physician practitioner and as supervising physician I was immediately available for consultation/collaboration.  Raeford Razor, MD 08/26/11 [phone]

## 2011-08-26 NOTE — ED Provider Notes (Signed)
Medical screening examination/treatment/procedure(s) were conducted as a shared visit with non-physician practitioner(s) and myself.  I personally evaluated the patient during the encounter  Flint Melter, MD 08/26/11 2000

## 2011-08-28 NOTE — ED Provider Notes (Signed)
Medical screening examination/treatment/procedure(s) were conducted as a shared visit with non-physician practitioner(s) and myself.  I personally evaluated the patient during the encounter  Flint Melter, MD 08/28/11 1009

## 2011-10-07 ENCOUNTER — Encounter (HOSPITAL_COMMUNITY): Payer: Self-pay | Admitting: *Deleted

## 2011-10-07 ENCOUNTER — Inpatient Hospital Stay (HOSPITAL_COMMUNITY)
Admission: AD | Admit: 2011-10-07 | Discharge: 2011-10-07 | Disposition: A | Discharge status: Home / Self Care | Payer: Self-pay | Source: Ambulatory Visit | Attending: Obstetrics & Gynecology | Admitting: Obstetrics & Gynecology

## 2011-10-07 DIAGNOSIS — O99891 Other specified diseases and conditions complicating pregnancy: Secondary | ICD-10-CM | POA: Insufficient documentation

## 2011-10-07 DIAGNOSIS — D649 Anemia, unspecified: Secondary | ICD-10-CM | POA: Insufficient documentation

## 2011-10-07 DIAGNOSIS — R109 Unspecified abdominal pain: Secondary | ICD-10-CM | POA: Insufficient documentation

## 2011-10-07 DIAGNOSIS — N949 Unspecified condition associated with female genital organs and menstrual cycle: Secondary | ICD-10-CM

## 2011-10-07 DIAGNOSIS — O99019 Anemia complicating pregnancy, unspecified trimester: Secondary | ICD-10-CM | POA: Insufficient documentation

## 2011-10-07 LAB — URINALYSIS, ROUTINE W REFLEX MICROSCOPIC
Bilirubin Urine: NEGATIVE
Nitrite: NEGATIVE
Specific Gravity, Urine: 1.02 (ref 1.005–1.030)
pH: 6 (ref 5.0–8.0)

## 2011-10-07 LAB — CBC
Hemoglobin: 9.4 g/dL — ABNORMAL LOW (ref 12.0–15.0)
MCH: 27.6 pg (ref 26.0–34.0)
RBC: 3.4 MIL/uL — ABNORMAL LOW (ref 3.87–5.11)

## 2011-10-07 MED ORDER — FERROUS SULFATE 325 (65 FE) MG PO TABS: 325.0000 mg | ORAL_TABLET | Freq: Three times a day (TID) | ORAL | Status: DC

## 2011-10-07 NOTE — MAU Provider Note (Signed)
History     CSN: [id number]  Arrival date and time: 10/07/11 4098   First Provider Initiated Contact with Patient 10/07/11 1840      Chief Complaint  Patient presents with  . Abdominal Pain   HPI 25 y.o. [id number] at [redacted]w[redacted]d. Right sided low abd pain, better at rest, worse with movement/walking. Denies GI sx, vaginal bleeding, cramping/contractions, vaginal discharge. No prenatal care yet, awaiting medicaid.    Past Medical History  Diagnosis Date  . Anemia   . Urinary tract infection   . Anxiety     Past Surgical History  Procedure Date  . Cholecystectomy     age 80  . Choley     Family History  Problem Relation Age of Onset  . Anesthesia problems Neg Hx   . Hearing loss Neg Hx     History  Substance Use Topics  . Smoking status: Never Smoker   . Smokeless tobacco: Never Used  . Alcohol Use: No    Allergies: No Known Allergies  Prescriptions prior to admission  Medication Sig Dispense Refill  . promethazine (PHENERGAN) 25 MG tablet Take 25 mg by mouth every 6 (six) hours as needed.      Marland Kitchen DISCONTD: ferrous sulfate 325 (65 FE) MG tablet Take 325 mg by mouth daily.        Marland Kitchen DISCONTD: OVER THE COUNTER MEDICATION Take 1 tablet by mouth at bedtime as needed. For sleep; over the counter sleep aid        Review of Systems  Constitutional: Negative.   Respiratory: Negative.   Cardiovascular: Negative.   Gastrointestinal: Positive for abdominal pain. Negative for nausea, vomiting, diarrhea and constipation.  Genitourinary: Negative for dysuria, urgency, frequency, hematuria and flank pain.       Negative for vaginal bleeding, cramping/contractions  Musculoskeletal: Negative.   Neurological: Negative.   Psychiatric/Behavioral: Negative.    Physical Exam   Blood pressure 116/55, pulse 76, temperature 99.1 F (37.3 C), temperature source Oral, resp. rate 18, height 4\' 11"  (1.499 m), weight 101 lb 12.8 oz (46.176 kg), last menstrual period 05/27/2011.  Physical  Exam  Nursing note and vitals reviewed. Constitutional: She is oriented to person, place, and time. She appears well-developed and well-nourished. No distress.  Cardiovascular: Normal rate.   Respiratory: Effort normal. No respiratory distress.  GI: Soft. She exhibits no distension and no mass. There is tenderness (mild, diffuse right sided). There is no rebound and no guarding.  Genitourinary: Vagina normal.  Musculoskeletal: Normal range of motion.  Neurological: She is alert and oriented to person, place, and time.  Skin: Skin is warm and dry.  Psychiatric: She has a normal mood and affect.    MAU Course  Procedures Results for orders placed during the hospital encounter of 10/07/11 (from the past 24 hour(s))  URINALYSIS, ROUTINE W REFLEX MICROSCOPIC     Status: Abnormal   Collection Time   10/07/11  6:17 PM      Component Value Range   Color, Urine YELLOW  YELLOW    APPearance CLEAR  CLEAR    Specific Gravity, Urine 1.020  1.005 - 1.030    pH 6.0  5.0 - 8.0    Glucose, UA NEGATIVE  NEGATIVE (mg/dL)   Hgb urine dipstick NEGATIVE  NEGATIVE    Bilirubin Urine NEGATIVE  NEGATIVE    Ketones, ur 15 (*) NEGATIVE (mg/dL)   Protein, ur NEGATIVE  NEGATIVE (mg/dL)   Urobilinogen, UA 1.0  0.0 - 1.0 (mg/dL)   Nitrite  NEGATIVE  NEGATIVE    Leukocytes, UA NEGATIVE  NEGATIVE   CBC     Status: Abnormal   Collection Time   10/07/11  7:00 PM      Component Value Range   WBC 7.5  4.0 - 10.5 (K/uL)   RBC 3.40 (*) 3.87 - 5.11 (MIL/uL)   Hemoglobin 9.4 (*) 12.0 - 15.0 (g/dL)   HCT 16.1 (*) 09.6 - 46.0 (%)   MCV 82.4  78.0 - 100.0 (fL)   MCH 27.6  26.0 - 34.0 (pg)   MCHC 33.6  30.0 - 36.0 (g/dL)   RDW 04.5  40.9 - 81.1 (%)   Platelets 282  150 - 400 (K/uL)     Assessment and Plan  25 y.o. [id number] at [redacted]w[redacted]d Round ligament pain - rev'd comfort measures Anemia - rx ferrous sulfate 325 mg tid Start prenatal care as soon as possible Rev'd precautions  Roxy Mastandrea 10/07/2011, 8:02 PM

## 2011-10-07 NOTE — MAU Note (Signed)
Pain started on Thurs. Comes and goes, more noticeable when up or walking.  occ n/v, none currently. Denies Gu problems.

## 2011-10-07 NOTE — MAU Note (Signed)
Filed for medicaid over a month ago- has called to find out what the hold up is, no return call.

## 2011-11-10 ENCOUNTER — Ambulatory Visit (INDEPENDENT_AMBULATORY_CARE_PROVIDER_SITE_OTHER): Payer: Medicaid Other | Admitting: Obstetrics and Gynecology

## 2011-11-10 ENCOUNTER — Telehealth: Payer: Self-pay | Admitting: Obstetrics and Gynecology

## 2011-11-10 DIAGNOSIS — Z331 Pregnant state, incidental: Secondary | ICD-10-CM

## 2011-11-10 NOTE — Progress Notes (Signed)
EXPOSED TO LATEX PAINT.  CONSULT WITH MK.  ANATOMY U/S TO BE SCHED PRIOR TO NOB W/U.   R/S TO 11/15/11. ATTEMPTED TO  CONTACT PT.

## 2011-11-11 LAB — PRENATAL PANEL VII
Basophils Absolute: 0 10*3/uL (ref 0.0–0.1)
Basophils Relative: 0 % (ref 0–1)
HIV: NONREACTIVE
Hepatitis B Surface Ag: NEGATIVE
MCHC: 31.8 g/dL (ref 30.0–36.0)
Neutro Abs: 4.7 10*3/uL (ref 1.7–7.7)
Neutrophils Relative %: 50 % (ref 43–77)
Platelets: 317 10*3/uL (ref 150–400)
RDW: 15.1 % (ref 11.5–15.5)

## 2011-11-12 LAB — CULTURE, OB URINE

## 2011-11-13 ENCOUNTER — Telehealth: Payer: Self-pay | Admitting: Obstetrics and Gynecology

## 2011-11-13 NOTE — Telephone Encounter (Signed)
TC to pt.  Unable to leave message.   TC to [place].   Informed of change in appt. For [employer] understanding.

## 2011-11-14 ENCOUNTER — Encounter (HOSPITAL_COMMUNITY): Payer: Self-pay | Admitting: *Deleted

## 2011-11-14 ENCOUNTER — Inpatient Hospital Stay (HOSPITAL_COMMUNITY)
Admission: AD | Admit: 2011-11-14 | Discharge: 2011-11-24 | DRG: 765 | Disposition: A | Discharge status: Home / Self Care | Payer: Medicaid Other | Source: Ambulatory Visit | Attending: Obstetrics and Gynecology | Admitting: Obstetrics and Gynecology

## 2011-11-14 ENCOUNTER — Encounter: Payer: Medicaid Other | Admitting: Obstetrics and Gynecology

## 2011-11-14 DIAGNOSIS — N939 Abnormal uterine and vaginal bleeding, unspecified: Secondary | ICD-10-CM | POA: Diagnosis not present

## 2011-11-14 DIAGNOSIS — O218 Other vomiting complicating pregnancy: Secondary | ICD-10-CM

## 2011-11-14 DIAGNOSIS — Z331 Pregnant state, incidental: Secondary | ICD-10-CM

## 2011-11-14 DIAGNOSIS — O2690 Pregnancy related conditions, unspecified, unspecified trimester: Secondary | ICD-10-CM

## 2011-11-14 DIAGNOSIS — D649 Anemia, unspecified: Secondary | ICD-10-CM | POA: Diagnosis not present

## 2011-11-14 DIAGNOSIS — O4400 Placenta previa specified as without hemorrhage, unspecified trimester: Secondary | ICD-10-CM | POA: Clinically undetermined

## 2011-11-14 DIAGNOSIS — O4590 Premature separation of placenta, unspecified, unspecified trimester: Secondary | ICD-10-CM | POA: Diagnosis present

## 2011-11-14 DIAGNOSIS — O212 Late vomiting of pregnancy: Secondary | ICD-10-CM | POA: Diagnosis present

## 2011-11-14 DIAGNOSIS — O9903 Anemia complicating the puerperium: Secondary | ICD-10-CM | POA: Diagnosis not present

## 2011-11-14 DIAGNOSIS — O99892 Other specified diseases and conditions complicating childbirth: Secondary | ICD-10-CM | POA: Diagnosis present

## 2011-11-14 DIAGNOSIS — R7989 Other specified abnormal findings of blood chemistry: Secondary | ICD-10-CM | POA: Diagnosis present

## 2011-11-14 DIAGNOSIS — O4290 Premature rupture of membranes, unspecified as to length of time between rupture and onset of labor, unspecified weeks of gestation: Principal | ICD-10-CM | POA: Diagnosis present

## 2011-11-14 DIAGNOSIS — O0930 Supervision of pregnancy with insufficient antenatal care, unspecified trimester: Secondary | ICD-10-CM

## 2011-11-14 DIAGNOSIS — Z22330 Carrier of Group B streptococcus: Secondary | ICD-10-CM

## 2011-11-14 DIAGNOSIS — R1110 Vomiting, unspecified: Secondary | ICD-10-CM | POA: Diagnosis present

## 2011-11-14 LAB — URINALYSIS, ROUTINE W REFLEX MICROSCOPIC
Glucose, UA: NEGATIVE mg/dL
Nitrite: NEGATIVE
Protein, ur: NEGATIVE mg/dL
pH: 6.5 (ref 5.0–8.0)

## 2011-11-14 LAB — COMPREHENSIVE METABOLIC PANEL
ALT: 10 U/L (ref 0–35)
AST: 16 U/L (ref 0–37)
Albumin: 3 g/dL — ABNORMAL LOW (ref 3.5–5.2)
Alkaline Phosphatase: 56 U/L (ref 39–117)
BUN: 7 mg/dL (ref 6–23)
Chloride: 104 mEq/L (ref 96–112)
Potassium: 3.4 mEq/L — ABNORMAL LOW (ref 3.5–5.1)
Sodium: 139 mEq/L (ref 135–145)
Total Bilirubin: 0.5 mg/dL (ref 0.3–1.2)

## 2011-11-14 LAB — WET PREP, GENITAL
Clue Cells Wet Prep HPF POC: NONE SEEN
Yeast Wet Prep HPF POC: NONE SEEN

## 2011-11-14 LAB — CBC
HCT: 30.3 % — ABNORMAL LOW (ref 36.0–46.0)
RDW: 13.9 % (ref 11.5–15.5)
WBC: 11.8 10*3/uL — ABNORMAL HIGH (ref 4.0–10.5)

## 2011-11-14 LAB — HEMOGLOBINOPATHY EVALUATION
Hemoglobin Other: 0 %
Hgb A2 Quant: 2.2 % (ref 2.2–3.2)
Hgb A: 97.8 % (ref 96.8–97.8)
Hgb S Quant: 0 %

## 2011-11-14 LAB — URINE MICROSCOPIC-ADD ON

## 2011-11-14 MED ORDER — PROMETHAZINE HCL 25 MG/ML IJ SOLN
25.0000 mg | Freq: Once | INTRAMUSCULAR | Status: AC
  Administered 2011-11-14: 25 mg via INTRAVENOUS
  Filled 2011-11-14: qty 1

## 2011-11-14 MED ORDER — LACTATED RINGERS IV BOLUS (SEPSIS)
500.0000 mL | Freq: Once | INTRAVENOUS | Status: AC
  Administered 2011-11-14: 23:00:00 via INTRAVENOUS

## 2011-11-14 NOTE — MAU Note (Signed)
Asking patient questions for MAU admission, patient with eyes closed only answering some of the questions. Asked patient what was bothering her the most & she said everything. Unable to keep baby monitoring continuously, heart tones heard in the 150s.

## 2011-11-14 NOTE — MAU Note (Signed)
PT SAYS SHE HAS BEEN VOMITING SINCE 5-16.  DID NOT CALL OFFICE.Marland Kitchen  NEXT APPOINTMENT IS  9 AM TOMORROW. LAST TIME ATE- 10AM-.   NO DIARRHEA. NO SORE THROAT.

## 2011-11-14 NOTE — MAU Provider Note (Signed)
History     CSN: [id number]  Arrival date and time: 11/14/11 2054   First Provider Initiated Contact with Patient 11/14/11 2249      Chief Complaint  Patient presents with  . Nausea   HPI Comments: Pt is a G5P3 at [redacted]w[redacted]d based on LMP ?05/27/11.  Pt arrives unannounced w c/o persistent n/v. Worsened over the last few days.  States no food or liquid is staying down Pt states this has been continuous since preg and HX hyperemesis prev preg. Pt denies ctx or cramping, but has back pain, Denies VB or d/c, +FM.  States "i feel so restless and uncomfortable" Pt laying on abdomen,  Has new OB workup with anatomy scan at [facility] tmrw. Has not started prenatal care Last visit in MAU at about 19w for RLP Pregnancy significant for: 1. Hx hyperemesis x3 2. ? LMP 3. Anemia 4. Hx anxiety 5. Hx trich       Past Medical History  Diagnosis Date  . Urinary tract infection   . Anxiety 2007    SHORT COURSE OF MEDS  . Anemia     TAKING FE  . Infection     UIT X 1  . Infection     TRICH X 1  . Infection     YEAST X1    Past Surgical History  Procedure Date  . Cholecystectomy     age 34  . Choley     Family History  Problem Relation Age of Onset  . Anesthesia problems Neg Hx   . Alcohol abuse Mother   . Lupus Brother     History  Substance Use Topics  . Smoking status: Never Smoker   . Smokeless tobacco: Never Used  . Alcohol Use: No    Allergies: No Known Allergies  Prescriptions prior to admission  Medication Sig Dispense Refill  . ferrous sulfate 325 (65 FE) MG tablet Take 1 tablet (325 mg total) by mouth 3 (three) times daily with meals.  90 tablet  3    Review of Systems  Constitutional: Positive for malaise/fatigue.  Gastrointestinal: Positive for nausea, vomiting and constipation.  Musculoskeletal: Positive for back pain.  All other systems reviewed and are negative.   Physical Exam   Blood pressure 109/71, pulse 96, temperature 98.2 F (36.8 C),  temperature source Oral, resp. rate 18, height 4\' 10"  (1.473 m), weight 99 lb 8 oz (45.133 kg), last menstrual period 05/27/2011, unknown if currently breastfeeding.  Physical Exam  Nursing note and vitals reviewed. Constitutional: She is oriented to person, place, and time. She appears well-developed and well-nourished. She appears distressed.       Pt is intermittent moaning, laying across bed on abd, does not open eyesk restless  HENT:  Head: Normocephalic and atraumatic.  Neck: Normal range of motion.  Cardiovascular: Normal rate, regular rhythm and normal heart sounds.   Respiratory: Effort normal and breath sounds normal.  GI: Soft. There is tenderness. There is no rebound and no guarding.       Decreased bsx4 Diffuse tenderness FH22-24cm FM palpated   Genitourinary: Vagina normal. No vaginal discharge found.       Cervix slightly friable, multiparous  FT/th/high  Musculoskeletal: Normal range of motion. She exhibits no edema.  Neurological: She is alert and oriented to person, place, and time.  Skin: Skin is warm and dry.  Psychiatric: She has a normal mood and affect. Her behavior is normal.    FHR traced briefly 150's mod variability, +accels, no  decels, tracing MHR at times toco initial ctx 5-6 min, then quiet  MAU Course  Procedures  IVF's Phenergan IV CBC CMET Wet prep GC/CT GBS   Assessment and Plan  IUP at [redacted]w[redacted]d Late to care Hyperemesis   Jashawna Reever M 11/14/2011, 10:59 PM   Addendum: Results for orders placed during the hospital encounter of 11/14/11 (from the past 24 hour(s))  URINALYSIS, ROUTINE W REFLEX MICROSCOPIC     Status: Abnormal   Collection Time   11/14/11  9:22 PM      Component Value Range   Color, Urine YELLOW  YELLOW    APPearance CLEAR  CLEAR    Specific Gravity, Urine 1.020  1.005 - 1.030    pH 6.5  5.0 - 8.0    Glucose, UA NEGATIVE  NEGATIVE (mg/dL)   Hgb urine dipstick NEGATIVE  NEGATIVE    Bilirubin Urine SMALL (*)  NEGATIVE    Ketones, ur >80 (*) NEGATIVE (mg/dL)   Protein, ur NEGATIVE  NEGATIVE (mg/dL)   Urobilinogen, UA 2.0 (*) 0.0 - 1.0 (mg/dL)   Nitrite NEGATIVE  NEGATIVE    Leukocytes, UA SMALL (*) NEGATIVE   URINE MICROSCOPIC-ADD ON     Status: Abnormal   Collection Time   11/14/11  9:22 PM      Component Value Range   Squamous Epithelial / LPF FEW (*) RARE    WBC, UA 3-6  <3 (WBC/hpf)   RBC / HPF 0-2  <3 (RBC/hpf)   Bacteria, UA FEW (*) RARE    Urine-Other MUCOUS PRESENT    CBC     Status: Abnormal   Collection Time   11/14/11 11:00 PM      Component Value Range   WBC 11.8 (*) 4.0 - 10.5 (K/uL)   RBC 3.63 (*) 3.87 - 5.11 (MIL/uL)   Hemoglobin 9.9 (*) 12.0 - 15.0 (g/dL)   HCT 76.7 (*) 34.1 - 46.0 (%)   MCV 83.5  78.0 - 100.0 (fL)   MCH 27.3  26.0 - 34.0 (pg)   MCHC 32.7  30.0 - 36.0 (g/dL)   RDW 93.7  90.2 - 40.9 (%)   Platelets 295  150 - 400 (K/uL)  COMPREHENSIVE METABOLIC PANEL     Status: Abnormal   Collection Time   11/14/11 11:00 PM      Component Value Range   Sodium 139  135 - 145 (mEq/L)   Potassium 3.4 (*) 3.5 - 5.1 (mEq/L)   Chloride 104  96 - 112 (mEq/L)   CO2 23  19 - 32 (mEq/L)   Glucose, Bld 87  70 - 99 (mg/dL)   BUN 7  6 - 23 (mg/dL)   Creatinine, Ser 7.35  0.50 - 1.10 (mg/dL)   Calcium 8.4  8.4 - 32.9 (mg/dL)   Total Protein 6.5  6.0 - 8.3 (g/dL)   Albumin 3.0 (*) 3.5 - 5.2 (g/dL)   AST 16  0 - 37 (U/L)   ALT 10  0 - 35 (U/L)   Alkaline Phosphatase 56  39 - 117 (U/L)   Total Bilirubin 0.5  0.3 - 1.2 (mg/dL)   GFR calc non Af Amer >90  >90 (mL/min)   GFR calc Af Amer >90  >90 (mL/min)  WET PREP, GENITAL     Status: Abnormal   Collection Time   11/14/11 11:28 PM      Component Value Range   Yeast Wet Prep HPF POC NONE SEEN  NONE SEEN    Trich, Wet Prep NONE SEEN  NONE SEEN    Clue Cells Wet Prep HPF POC NONE SEEN  NONE SEEN    WBC, Wet Prep HPF POC FEW (*) NONE SEEN   URINALYSIS, ROUTINE W REFLEX MICROSCOPIC     Status: Abnormal   Collection Time    11/15/11  1:25 AM      Component Value Range   Color, Urine YELLOW  YELLOW    APPearance CLEAR  CLEAR    Specific Gravity, Urine 1.025  1.005 - 1.030    pH 6.0  5.0 - 8.0    Glucose, UA NEGATIVE  NEGATIVE (mg/dL)   Hgb urine dipstick NEGATIVE  NEGATIVE    Bilirubin Urine NEGATIVE  NEGATIVE    Ketones, ur 40 (*) NEGATIVE (mg/dL)   Protein, ur NEGATIVE  NEGATIVE (mg/dL)   Urobilinogen, UA 1.0  0.0 - 1.0 (mg/dL)   Nitrite NEGATIVE  NEGATIVE    Leukocytes, UA NEGATIVE  NEGATIVE    After 2nd void, ketones decreased to 40 from 80 hgb 9.9, slight hypokalemia, otherwise nl  Wet prep neg Pt felt slightly better after phenergan very sleepy Then vomiting again, will order zofran IV and D5LR and reassess  D/W Dr Normand Sloop earlier   Addendum: 3rd void still pos for ketones Will admit for observation d5lr ivf's  phergan/zofran

## 2011-11-14 NOTE — MAU Note (Signed)
Pelvic done by Eliezer Champagne, CNM

## 2011-11-15 ENCOUNTER — Other Ambulatory Visit: Payer: Medicaid Other

## 2011-11-15 ENCOUNTER — Encounter (HOSPITAL_COMMUNITY): Payer: Self-pay

## 2011-11-15 ENCOUNTER — Encounter: Payer: Medicaid Other | Admitting: Obstetrics and Gynecology

## 2011-11-15 DIAGNOSIS — D649 Anemia, unspecified: Secondary | ICD-10-CM | POA: Diagnosis present

## 2011-11-15 DIAGNOSIS — R7989 Other specified abnormal findings of blood chemistry: Secondary | ICD-10-CM | POA: Diagnosis present

## 2011-11-15 DIAGNOSIS — R1110 Vomiting, unspecified: Secondary | ICD-10-CM | POA: Diagnosis present

## 2011-11-15 DIAGNOSIS — Z331 Pregnant state, incidental: Secondary | ICD-10-CM

## 2011-11-15 LAB — URINALYSIS, DIPSTICK ONLY
Glucose, UA: 500 mg/dL — AB
Ketones, ur: 40 mg/dL — AB
Leukocytes, UA: NEGATIVE
Nitrite: NEGATIVE
Protein, ur: NEGATIVE mg/dL
Urobilinogen, UA: 1 mg/dL (ref 0.0–1.0)

## 2011-11-15 LAB — URINALYSIS, ROUTINE W REFLEX MICROSCOPIC
Leukocytes, UA: NEGATIVE
Nitrite: NEGATIVE
Specific Gravity, Urine: 1.025 (ref 1.005–1.030)
Urobilinogen, UA: 1 mg/dL (ref 0.0–1.0)
pH: 6 (ref 5.0–8.0)

## 2011-11-15 LAB — TSH: TSH: 0.133 u[IU]/mL — ABNORMAL LOW (ref 0.350–4.500)

## 2011-11-15 MED ORDER — DEXTROSE IN LACTATED RINGERS 5 % IV SOLN
INTRAVENOUS | Status: DC
  Administered 2011-11-15 – 2011-11-17 (×7): via INTRAVENOUS

## 2011-11-15 MED ORDER — ZOLPIDEM TARTRATE 10 MG PO TABS: 10.0000 mg | ORAL_TABLET | Freq: Every evening | ORAL | Status: DC | PRN

## 2011-11-15 MED ORDER — DEXTROSE 5 % IN LACTATED RINGERS IV BOLUS
500.0000 mL | Freq: Once | INTRAVENOUS | Status: AC
  Administered 2011-11-15: 500 mL via INTRAVENOUS

## 2011-11-15 MED ORDER — ONDANSETRON HCL 4 MG/2ML IJ SOLN
4.0000 mg | Freq: Three times a day (TID) | INTRAMUSCULAR | Status: DC
  Administered 2011-11-15 – 2011-11-19 (×12): 4 mg via INTRAVENOUS
  Filled 2011-11-15 (×12): qty 2

## 2011-11-15 MED ORDER — PROMETHAZINE HCL 25 MG/ML IJ SOLN
25.0000 mg | Freq: Four times a day (QID) | INTRAMUSCULAR | Status: DC
  Administered 2011-11-15 – 2011-11-19 (×16): 25 mg via INTRAVENOUS
  Filled 2011-11-15 (×16): qty 1

## 2011-11-15 MED ORDER — SODIUM CHLORIDE 0.9 % IJ SOLN
INTRAMUSCULAR | Status: AC
  Administered 2011-11-15: 3 mL
  Filled 2011-11-15: qty 3

## 2011-11-15 MED ORDER — CALCIUM CARBONATE ANTACID 500 MG PO CHEW: 2.0000 | CHEWABLE_TABLET | ORAL | Status: DC | PRN

## 2011-11-15 MED ORDER — LACTATED RINGERS IV BOLUS (SEPSIS)
500.0000 mL | Freq: Once | INTRAVENOUS | Status: DC
  Administered 2011-11-15: 500 mL via INTRAVENOUS

## 2011-11-15 MED ORDER — ACETAMINOPHEN 325 MG PO TABS: 650.0000 mg | ORAL_TABLET | ORAL | Status: DC | PRN

## 2011-11-15 MED ORDER — PRENATAL MULTIVITAMIN CH
1.0000 | ORAL_TABLET | Freq: Every day | ORAL | Status: DC
  Administered 2011-11-15 – 2011-11-20 (×5): 1 via ORAL
  Filled 2011-11-15 (×6): qty 1

## 2011-11-15 MED ORDER — ONDANSETRON HCL 4 MG/2ML IJ SOLN
4.0000 mg | Freq: Once | INTRAMUSCULAR | Status: AC
  Administered 2011-11-15: 4 mg via INTRAVENOUS
  Filled 2011-11-15: qty 2

## 2011-11-15 MED ORDER — DOCUSATE SODIUM 100 MG PO CAPS
100.0000 mg | ORAL_CAPSULE | Freq: Every day | ORAL | Status: DC
  Administered 2011-11-15 – 2011-11-20 (×5): 100 mg via ORAL
  Filled 2011-11-15 (×5): qty 1

## 2011-11-15 NOTE — Progress Notes (Signed)
Pt still very sleepy Unable to void again, yet Declines d/c at this time Will recheck UA for ketones when able to void and consider d/c

## 2011-11-15 NOTE — H&P (Signed)
Carla Haas is a 26 y.o. female presenting for worsening of hyperemesis. See prev MAU note  HPI: pt seen multiple times in MAU and ER in first trim for N/V, has not began [facility], has NOB w/u and [place] sched at off today.  Maternal Medical History:  Reason for admission: Hyperemesis   Fetal activity: Perceived fetal activity is normal.   Last perceived fetal movement was within the past hour.      OB History    Grav Para Term Preterm Abortions TAB SAB Ect Mult Living   5 3 3  0 1 0 1 0 0 3     Past Medical History  Diagnosis Date  . Urinary tract infection   . Anxiety 2007    SHORT COURSE OF MEDS  . Anemia     TAKING FE  . Infection     UIT X 1  . Infection     TRICH X 1  . Infection     YEAST X1   Past Surgical History  Procedure Date  . Cholecystectomy     age 72  . Choley    Family History: family history includes Alcohol abuse in her mother and Lupus in her brother.  There is no history of Anesthesia problems. Social History:  reports that she has never smoked. She has never used smokeless tobacco. She reports that she does not drink alcohol or use illicit drugs.  Review of Systems  All other systems reviewed and are negative.    Dilation: Fingertip Effacement (%): Thick Exam by:: S Lilliard cnm Blood pressure 97/60, pulse 69, temperature 98.1 F (36.7 C), temperature source Oral, resp. rate 16, height 4\' 10"  (1.473 m), weight 99 lb 8 oz (45.133 kg), last menstrual period 05/27/2011, SpO2 97.00%, currently breastfeeding. Maternal Exam:  Abdomen: Patient reports generalized tenderness.  Fundal height is aga.    Introitus: Normal vulva. Normal vagina.  Vagina is negative for discharge.  Pelvis: adequate for delivery.   Cervix: Cervix evaluated by sterile speculum exam and digital exam.     Fetal Exam Fetal Monitor Review: Mode: ultrasound.   Baseline rate: 150.  Variability: moderate (6-25 bpm).   Pattern: accelerations present and no decelerations.     Fetal State Assessment: Category I - tracings are normal.     Physical Exam  Nursing note and vitals reviewed. Constitutional: She is oriented to person, place, and time. She appears well-developed and well-nourished. She appears distressed.  HENT:  Head: Normocephalic.  Neck: Normal range of motion.  Cardiovascular: Normal rate, regular rhythm and normal heart sounds.   Respiratory: Effort normal and breath sounds normal.  GI: Soft. There is generalized tenderness.  Genitourinary: Vagina normal. No vaginal discharge found.  Musculoskeletal: Normal range of motion. She exhibits no edema.  Neurological: She is alert and oriented to person, place, and time.  Skin: Skin is warm and dry.  Psychiatric: She has a normal mood and affect. Her behavior is normal.    Prenatal labs: ABO, Rh: A/POS/-- (05/17 1111) Antibody: NEG (05/17 1111) Rubella: 38.8 (05/17 1111) RPR: NON REAC (05/17 1111)  HBsAg: NEGATIVE (05/17 1111)  HIV: NON REACTIVE (05/17 1111)  GBS:     Assessment/Plan: IUP at 24wks Hyperemesis  Admit for obs  Carla Haas M 11/15/2011, 7:15 AM

## 2011-11-15 NOTE — Progress Notes (Addendum)
26 y.o. year old female,at [redacted]w[redacted]d gestation. She was admitted to the hospital with hyperemesis. She is n.p.o. for now. She has not had any vomiting.  SUBJECTIVE:  The patient reports she feels some better but is still nauseated.  OBJECTIVE:  BP 92/49  Pulse 69  Temp(Src) 98.5 F (36.9 C) (Oral)  Resp 18  Ht 4\' 10"  (1.473 m)  Wt 45.133 kg (99 lb 8 oz)  BMI 20.80 kg/m2  SpO2 99%  LMP 05/27/2011  Breastfeeding? Yes  Fetal Heart Tones:  150 beats per minute  Contractions:          None  Abdomen nontender  Prealbumin: 15.6  ASSESSMENT:  [redacted]w[redacted]d Weeks Pregnancy  Hyperemesis  PLAN:  Nutrition consult  free T4 Continue IV support Try to advance diet in the morning  Leonard Schwartz, M.D.

## 2011-11-15 NOTE — Progress Notes (Signed)
UR chart review completed.  

## 2011-11-15 NOTE — Telephone Encounter (Signed)
Encounter complete. 

## 2011-11-15 NOTE — Progress Notes (Addendum)
No sx improvement Not tol PO Ketones still pos after 3rd void Will admit to 3rd floor for obs today NPO for now Will add TSH to labs Will need to resched anat [place]  D/W Dr Normand Sloop

## 2011-11-15 NOTE — Progress Notes (Signed)
S:  Pt resting on Lt side.  Denies any emesis since being transferred to 3rd floor early this morning.  She has remained NPO.  She is alone; her s.o. Is caring for her 3 other kids (almost 26y.o., 4 y.o. And almost 20mo old).  She was suppose to have her first ob visit at our office today at 0900 w/ an anatomy u/s.  She had her previous children w/ "Dr. Gaynell Face." She denies any resp c/o's.  GFM.  No ctxs to report.  Pt is a SAHM.    O:  .Marland Kitchen Filed Vitals:   11/14/11 2336 11/15/11 0620 11/15/11 0635 11/15/11 1200  BP: 113/64 90/72 97/60  95/60  Pulse: 71 74 69 65  Temp:   98.1 F (36.7 C) 98.2 F (36.8 C)  TempSrc:   Oral Oral  Resp: 16 18 16 18   Height:      Weight:      SpO2:   97% 100%  .Marland Kitchen Results for orders placed during the hospital encounter of 11/14/11 (from the past 24 hour(s))  URINALYSIS, ROUTINE W REFLEX MICROSCOPIC     Status: Abnormal   Collection Time   11/14/11  9:22 PM      Component Value Range   Color, Urine YELLOW  YELLOW    APPearance CLEAR  CLEAR    Specific Gravity, Urine 1.020  1.005 - 1.030    pH 6.5  5.0 - 8.0    Glucose, UA NEGATIVE  NEGATIVE (mg/dL)   Hgb urine dipstick NEGATIVE  NEGATIVE    Bilirubin Urine SMALL (*) NEGATIVE    Ketones, ur >80 (*) NEGATIVE (mg/dL)   Protein, ur NEGATIVE  NEGATIVE (mg/dL)   Urobilinogen, UA 2.0 (*) 0.0 - 1.0 (mg/dL)   Nitrite NEGATIVE  NEGATIVE    Leukocytes, UA SMALL (*) NEGATIVE   URINE MICROSCOPIC-ADD ON     Status: Abnormal   Collection Time   11/14/11  9:22 PM      Component Value Range   Squamous Epithelial / LPF FEW (*) RARE    WBC, UA 3-6  <3 (WBC/hpf)   RBC / HPF 0-2  <3 (RBC/hpf)   Bacteria, UA FEW (*) RARE    Urine-Other MUCOUS PRESENT    CBC     Status: Abnormal   Collection Time   11/14/11 11:00 PM      Component Value Range   WBC 11.8 (*) 4.0 - 10.5 (K/uL)   RBC 3.63 (*) 3.87 - 5.11 (MIL/uL)   Hemoglobin 9.9 (*) 12.0 - 15.0 (g/dL)   HCT 84.1 (*) 32.4 - 46.0 (%)   MCV 83.5  78.0 - 100.0 (fL)   MCH  27.3  26.0 - 34.0 (pg)   MCHC 32.7  30.0 - 36.0 (g/dL)   RDW 40.1  02.7 - 25.3 (%)   Platelets 295  150 - 400 (K/uL)  COMPREHENSIVE METABOLIC PANEL     Status: Abnormal   Collection Time   11/14/11 11:00 PM      Component Value Range   Sodium 139  135 - 145 (mEq/L)   Potassium 3.4 (*) 3.5 - 5.1 (mEq/L)   Chloride 104  96 - 112 (mEq/L)   CO2 23  19 - 32 (mEq/L)   Glucose, Bld 87  70 - 99 (mg/dL)   BUN 7  6 - 23 (mg/dL)   Creatinine, Ser 6.64  0.50 - 1.10 (mg/dL)   Calcium 8.4  8.4 - 40.3 (mg/dL)   Total Protein 6.5  6.0 - 8.3 (g/dL)  Albumin 3.0 (*) 3.5 - 5.2 (g/dL)   AST 16  0 - 37 (U/L)   ALT 10  0 - 35 (U/L)   Alkaline Phosphatase 56  39 - 117 (U/L)   Total Bilirubin 0.5  0.3 - 1.2 (mg/dL)   GFR calc non Af Amer >90  >90 (mL/min)   GFR calc Af Amer >90  >90 (mL/min)  WET PREP, GENITAL     Status: Abnormal   Collection Time   11/14/11 11:28 PM      Component Value Range   Yeast Wet Prep HPF POC NONE SEEN  NONE SEEN    Trich, Wet Prep NONE SEEN  NONE SEEN    Clue Cells Wet Prep HPF POC NONE SEEN  NONE SEEN    WBC, Wet Prep HPF POC FEW (*) NONE SEEN   URINALYSIS, ROUTINE W REFLEX MICROSCOPIC     Status: Abnormal   Collection Time   11/15/11  1:25 AM      Component Value Range   Color, Urine YELLOW  YELLOW    APPearance CLEAR  CLEAR    Specific Gravity, Urine 1.025  1.005 - 1.030    pH 6.0  5.0 - 8.0    Glucose, UA NEGATIVE  NEGATIVE (mg/dL)   Hgb urine dipstick NEGATIVE  NEGATIVE    Bilirubin Urine NEGATIVE  NEGATIVE    Ketones, ur 40 (*) NEGATIVE (mg/dL)   Protein, ur NEGATIVE  NEGATIVE (mg/dL)   Urobilinogen, UA 1.0  0.0 - 1.0 (mg/dL)   Nitrite NEGATIVE  NEGATIVE    Leukocytes, UA NEGATIVE  NEGATIVE   URINALYSIS, DIPSTICK ONLY     Status: Abnormal   Collection Time   11/15/11  4:40 AM      Component Value Range   Specific Gravity, Urine 1.025  1.005 - 1.030    pH 6.0  5.0 - 8.0    Glucose, UA 500 (*) NEGATIVE (mg/dL)   Hgb urine dipstick NEGATIVE  NEGATIVE     Bilirubin Urine NEGATIVE  NEGATIVE    Ketones, ur 40 (*) NEGATIVE (mg/dL)   Protein, ur NEGATIVE  NEGATIVE (mg/dL)   Urobilinogen, UA 1.0  0.0 - 1.0 (mg/dL)   Nitrite NEGATIVE  NEGATIVE    Leukocytes, UA NEGATIVE  NEGATIVE   TSH     Status: Abnormal   Collection Time   11/15/11  7:20 AM      Component Value Range   TSH 0.133 (*) 0.350 - 4.500 (uIU/mL)  PE: Gen:  Malaise, lips dry        CV:  RRR        Lungs:  CTA Bilaterally        Abd:  Soft, gravid, NT        Pelvic deferred       Ext:  WNL  A:  1.  24.4 weeks      2.  No PNC      3.  Hyperemesis      4.  Ketonuria, but electrolytes WNL      5.  Low TSH      6.  Prealbumin pending  P.  Per c/w dr. Stefano Gaul, will continue w/ NPO status and IVF for now.  Will discuss TSH w/ MD.     Continue current POC otherwise.

## 2011-11-16 ENCOUNTER — Encounter (HOSPITAL_COMMUNITY): Payer: Self-pay | Admitting: *Deleted

## 2011-11-16 ENCOUNTER — Inpatient Hospital Stay (HOSPITAL_COMMUNITY): Payer: Medicaid Other

## 2011-11-16 DIAGNOSIS — O4290 Premature rupture of membranes, unspecified as to length of time between rupture and onset of labor, unspecified weeks of gestation: Secondary | ICD-10-CM

## 2011-11-16 LAB — URINALYSIS, ROUTINE W REFLEX MICROSCOPIC
Glucose, UA: NEGATIVE mg/dL
Hgb urine dipstick: NEGATIVE
Ketones, ur: NEGATIVE mg/dL
Protein, ur: NEGATIVE mg/dL
pH: 7.5 (ref 5.0–8.0)

## 2011-11-16 LAB — WET PREP, GENITAL
Clue Cells Wet Prep HPF POC: NONE SEEN
Trich, Wet Prep: NONE SEEN

## 2011-11-16 MED ORDER — MAGNESIUM SULFATE 40 G IN LACTATED RINGERS - SIMPLE: 2.0000 g/h | Freq: Once | INTRAVENOUS | Status: DC

## 2011-11-16 MED ORDER — AMOXICILLIN 500 MG PO CAPS
500.0000 mg | ORAL_CAPSULE | Freq: Three times a day (TID) | ORAL | Status: DC
  Administered 2011-11-19 – 2011-11-20 (×6): 500 mg via ORAL
  Filled 2011-11-16 (×8): qty 1

## 2011-11-16 MED ORDER — AZITHROMYCIN 500 MG PO TABS
1000.0000 mg | ORAL_TABLET | Freq: Once | ORAL | Status: AC
  Administered 2011-11-16: 1000 mg via ORAL
  Filled 2011-11-16: qty 2

## 2011-11-16 MED ORDER — MAGNESIUM SULFATE 50 % IJ SOLN: 6.0000 g | Freq: Once | INTRAVENOUS | Status: DC

## 2011-11-16 MED ORDER — MAGNESIUM SULFATE BOLUS VIA INFUSION
6.0000 g | Freq: Once | INTRAVENOUS | Status: DC
  Filled 2011-11-16: qty 500

## 2011-11-16 MED ORDER — PANTOPRAZOLE SODIUM 40 MG IV SOLR
40.0000 mg | INTRAVENOUS | Status: DC
  Administered 2011-11-16 – 2011-11-20 (×5): 40 mg via INTRAVENOUS
  Filled 2011-11-16 (×7): qty 40

## 2011-11-16 MED ORDER — SODIUM CHLORIDE 0.9 % IV SOLN
2.0000 g | Freq: Four times a day (QID) | INTRAVENOUS | Status: AC
  Administered 2011-11-16 – 2011-11-18 (×8): 2 g via INTRAVENOUS
  Filled 2011-11-16 (×8): qty 2000

## 2011-11-16 MED ORDER — BUTORPHANOL TARTRATE 2 MG/ML IJ SOLN
1.0000 mg | Freq: Once | INTRAMUSCULAR | Status: AC
  Administered 2011-11-16: 1 mg via INTRAVENOUS
  Filled 2011-11-16: qty 1

## 2011-11-16 MED ORDER — MAGNESIUM SULFATE 40 G IN LACTATED RINGERS - SIMPLE
2.0000 g/h | INTRAVENOUS | Status: DC
  Filled 2011-11-16: qty 500

## 2011-11-16 MED ORDER — GLYCOPYRROLATE 0.2 MG/ML IJ SOLN
0.1000 mg | Freq: Three times a day (TID) | INTRAMUSCULAR | Status: DC | PRN
  Administered 2011-11-16: 0.1 mg via INTRAVENOUS
  Filled 2011-11-16: qty 0.5

## 2011-11-16 NOTE — Progress Notes (Addendum)
Subjective: Sleeping soundly.  RN reports patient ate a small amount of food at breakfast--no known vomiting since. Dietician saw patient this am--see note.    Objective: I have reviewed patient's vital signs, intake and output and labs.  Results for orders placed during the hospital encounter of 11/14/11 (from the past 24 hour(s))  T4, FREE     Status: Normal   Collection Time   11/16/11  5:26 AM      Component Value Range   Free T4 1.05  0.80 - 1.80 (ng/dL)  URINALYSIS, ROUTINE W REFLEX MICROSCOPIC     Status: Abnormal   Collection Time   11/16/11  9:05 AM      Component Value Range   Color, Urine YELLOW  YELLOW    APPearance CLEAR  CLEAR    Specific Gravity, Urine 1.010  1.005 - 1.030    pH 7.5  5.0 - 8.0    Glucose, UA NEGATIVE  NEGATIVE (mg/dL)   Hgb urine dipstick NEGATIVE  NEGATIVE    Bilirubin Urine NEGATIVE  NEGATIVE    Ketones, ur NEGATIVE  NEGATIVE (mg/dL)   Protein, ur NEGATIVE  NEGATIVE (mg/dL)   Urobilinogen, UA 2.0 (*) 0.0 - 1.0 (mg/dL)   Nitrite NEGATIVE  NEGATIVE    Leukocytes, UA NEGATIVE  NEGATIVE    IV hydration continuing. Previous UA from 11/15/11--40 ketones.   Assessment/Plan: IUP at 24 5/7 weeks Hyperemesis Hx anxiety disorder, per previous hospital H&Ps ? Depression per affect Resolution of ketonuria Low TSH, but normal Free T4  Plan: Will see patient when she awakens. Continue observation of po intake and tolerance SW consult to assess status and situation.   LOS: 2 days    Nigel Bridgeman 11/16/2011, 12:23 PM  Addendum: Awake now--still feels very nauseated and weak.  "Ate a bite or two" of breakfast--did not vomit. Just requested more Phenergan due to nausea.   Reviewed status with patient.  Has some reflux and mild ptyalism. Rx Protonix daily and Robinul prn. Will observe tolerance of today's meals.  Nigel Bridgeman, CNM, [place] 11/16/11 12:35p

## 2011-11-16 NOTE — Progress Notes (Signed)
Pt called out for help from bathroom, upon arrival, RN found pt over toliet with bloody fluid in hat and on the floor.

## 2011-11-16 NOTE — Progress Notes (Signed)
Pt called out from the bathroom, screams what just happened. Pt standing in pool of bloody fluid, assisted back to bed.

## 2011-11-16 NOTE — Progress Notes (Signed)
UR Chart review completed.  

## 2011-11-16 NOTE — Progress Notes (Signed)
S:the patient is a 26 year old white female gravida 6 para 3-0-2-3 admitted for management of nausea and vomiting during pregnancy. The patient presented giving an uncertain last menstrual period of December 1 as her only dating criteria. In fact the patient had a spontaneous abortion in November of 2012 and her December cycle was her only menses before discovering she was again pregnant. She has undergone hydration and had some improvement in her nausea and vomiting but got up to the bathroom with nausea this evening and felt blood running down her leg. His was followed by fluid and she was subsequently noted to have ruptured her membranes.  Significant abdominal and back pain followed almost immediately. O:BP 134/90  Pulse 95  Temp(Src) 98.8 F (37.1 C) (Oral)  Resp 22  Ht 4\' 10"  (1.473 m)  Wt 105 lb 7 oz (47.826 kg)  BMI 22.04 kg/m2  SpO2 99%  LMP 05/27/2011  Breastfeeding? Yes The patient is writhing in bed and holding pressure against her back. Please see the note by Lavera Guise certified nurse midwife for the details of the pelvic examination. Ultrasound examination shows a single fetus in variable position with a biparietal diameter consistent with 22 weeks and 6 days. There is essentially no amniotic fluid with no pockets greater than or equal to 2 cm identified. The placenta is posterior and low-lying. The lower limit of the placenta is difficult to discern.   There is material that may be consistent with clot in front of the placenta. A:  Intrauterine pregnancy at 22 weeks and 6 days by best dating criteria available      Preterm premature rupture of membrane      Apparent ensuing labor P: I will present the patient to the on-call maternal fetal medicine consultant to discuss options for management of the patient.

## 2011-11-16 NOTE — Progress Notes (Signed)
Called to evaluate pt by RN for SROM  Pt in bed c/o of leaking again and again since up to bathroom, uc, backache O Fhts 140    abd soft, gravid, nt    uc palpate mild    SSE bloody fluid to watery pink seen, no clots    Digital exam 1 thick high    Fern positive A premature rupture of membranes P Gc/chl, wet prep, FFN obtained, Dr. Pennie Rushing updated et on the way. [place] ordered and Magnesium neuro prophylaxis.  Lavera Guise, CNM Late entry from 2145 wet prep neg

## 2011-11-16 NOTE — Progress Notes (Signed)
At 2115 patient up to bathroom  Stated felt a pop, holding back and bent over, bright red blood noted from vagina.  Placed back to bed, vitals taken, FHR 159 dopplered, placed on toco contractions noted [facility] notified, In room at 2135, examined pt vaginally and cultures obtained.   Report called to [facility] in [facility], Patient transferred to room 162 via bed.

## 2011-11-16 NOTE — Progress Notes (Signed)
INITIAL ADULT NUTRITION ASSESSMENT Date: 11/16/2011   Time: 9:50 AM Reason for Assessment: Hyperemesis  ASSESSMENT: Female 26 y.o.  Dx:  Patient Active Problem List  Diagnoses  . Hyperemesis  . Anemia  . Pregnant state, incidental  . Low TSH level   Hx: Hyperemesis with previous pregnancy. Lasted throughout pregnancy per pt.   Past Medical History  Diagnosis Date  . Urinary tract infection   . Anxiety 2007    SHORT COURSE OF MEDS  . Anemia     TAKING FE  . Infection     UIT X 1  . Infection     TRICH X 1  . Infection     YEAST X1   Related Meds:     . docusate sodium  100 mg Oral Daily  . ondansetron (ZOFRAN) IV  4 mg Intravenous Q8H  . prenatal multivitamin  1 tablet Oral Daily  . promethazine  25 mg Intravenous Q6H    Ht: 4\' 10"  (147.3 cm)  Wt: 105 lb 7 oz (47.826 kg) weight up 6 lbs from MAU adm. weight  Ideal Wt: 40.9 kg 45.4 kg % Ideal Wt: 100%  Usual Wt: 101 Lbs % Usual Wt: 104%  Body mass index is 22.04 kg/(m^2).  Food/Nutrition Related Hx: Pt reports intermittent N/V throughout this pregnancy. Several admissions to MAU.  2 Lb weight loss, 2 % loss of usual.   Labs:  CMP     Component Value Date/Time   NA 139 11/14/2011 2300   K 3.4* 11/14/2011 2300   CL 104 11/14/2011 2300   CO2 23 11/14/2011 2300   GLUCOSE 87 11/14/2011 2300   BUN 7 11/14/2011 2300   CREATININE 0.57 11/14/2011 2300   CALCIUM 8.4 11/14/2011 2300   PROT 6.5 11/14/2011 2300   ALBUMIN 3.0* 11/14/2011 2300   AST 16 11/14/2011 2300   ALT 10 11/14/2011 2300   ALKPHOS 56 11/14/2011 2300   BILITOT 0.5 11/14/2011 2300   GFRNONAA >90 11/14/2011 2300   GFRAA >90 11/14/2011 2300     Intake:   Intake/Output Summary (Last 24 hours) at 11/16/11 0955 Last data filed at 11/16/11 0919  Gross per 24 hour  Intake 2968.38 ml  Output    650 ml  Net 2318.38 ml   Output:   Diet Order: General Pt with nausea after eating some breakfast this morning, spitting. Discussed menu options and foods to  select as long as vomiting, C/L and then small frequent meals of bland, low fat  foods Education of Diet for Hyperemesis completed. Handout " Nutrition Therapy for Morning Sickness" given to pt. Weight loss not significant enough to consider tube feedings yet  Supplements/Tube Feeding:  IVF:    dextrose 5% lactated ringers Last Rate: 125 mL/hr at 11/16/11 1610    Estimated Nutritional Needs:   Kcal: 13-1600 Protein: 50-60 g Fluid: 1.6 L  NUTRITION DIAGNOSIS: -Inadequate oral intake (NI-2.1).  Status: Ongoing R/t hyperemesis aeb patient report and observed N/V  Pt with non-severe degree of malnutrition, due to a 2 % loss of usual weight in one week and intake of < 75 % of estimated needs for > 7 days, appears very thin.  MONITORING/EVALUATION(Goals): Improved po tolerance and intake, allowing to meet above estimated needs for pregnancy  EDUCATION NEEDS: -Education needs addressed  INTERVENTION: Antenatal regular diet as tolerated. This will allow between meal snacks of patients choice as well as clear liquids  Dietitian #:[phone]  DOCUMENTATION CODES Per approved criteria  -Non-severe (moderate) malnutrition in  the context of acute illness or injury    Jemarion Roycroft,KATHY 11/16/2011, 9:50 AM

## 2011-11-17 ENCOUNTER — Inpatient Hospital Stay (HOSPITAL_COMMUNITY): Payer: Medicaid Other

## 2011-11-17 DIAGNOSIS — O4290 Premature rupture of membranes, unspecified as to length of time between rupture and onset of labor, unspecified weeks of gestation: Secondary | ICD-10-CM | POA: Diagnosis not present

## 2011-11-17 DIAGNOSIS — N939 Abnormal uterine and vaginal bleeding, unspecified: Secondary | ICD-10-CM | POA: Diagnosis not present

## 2011-11-17 DIAGNOSIS — O210 Mild hyperemesis gravidarum: Secondary | ICD-10-CM

## 2011-11-17 DIAGNOSIS — O4700 False labor before 37 completed weeks of gestation, unspecified trimester: Secondary | ICD-10-CM

## 2011-11-17 DIAGNOSIS — O208 Other hemorrhage in early pregnancy: Secondary | ICD-10-CM

## 2011-11-17 DIAGNOSIS — O4400 Placenta previa specified as without hemorrhage, unspecified trimester: Secondary | ICD-10-CM | POA: Clinically undetermined

## 2011-11-17 DIAGNOSIS — O0930 Supervision of pregnancy with insufficient antenatal care, unspecified trimester: Secondary | ICD-10-CM

## 2011-11-17 LAB — CBC
HCT: 28 % — ABNORMAL LOW (ref 36.0–46.0)
Hemoglobin: 9.2 g/dL — ABNORMAL LOW (ref 12.0–15.0)
MCH: 27.5 pg (ref 26.0–34.0)
MCV: 83.6 fL (ref 78.0–100.0)
Platelets: 281 10*3/uL (ref 150–400)
RBC: 3.35 MIL/uL — ABNORMAL LOW (ref 3.87–5.11)

## 2011-11-17 LAB — CULTURE, BETA STREP (GROUP B ONLY)

## 2011-11-17 MED ORDER — LACTATED RINGERS IV SOLN
INTRAVENOUS | Status: DC
  Administered 2011-11-17 – 2011-11-20 (×10): via INTRAVENOUS
  Administered 2011-11-20: 100 mL/h via INTRAVENOUS

## 2011-11-17 MED ORDER — BUTORPHANOL TARTRATE 2 MG/ML IJ SOLN
1.0000 mg | Freq: Two times a day (BID) | INTRAMUSCULAR | Status: DC | PRN
  Administered 2011-11-17: 1 mg via INTRAVENOUS
  Filled 2011-11-17: qty 1

## 2011-11-17 MED ORDER — BUTORPHANOL TARTRATE 2 MG/ML IJ SOLN: 1.0000 mg | INTRAMUSCULAR | Status: DC | PRN

## 2011-11-17 MED ORDER — BETAMETHASONE SOD PHOS & ACET 6 (3-3) MG/ML IJ SUSP
12.0000 mg | INTRAMUSCULAR | Status: DC
  Administered 2011-11-17 – 2011-11-18 (×2): 12 mg via INTRAMUSCULAR
  Filled 2011-11-17 (×3): qty 2

## 2011-11-17 MED ORDER — MAGNESIUM SULFATE BOLUS VIA INFUSION
4.0000 g | Freq: Once | INTRAVENOUS | Status: AC
  Administered 2011-11-17: 4 g via INTRAVENOUS
  Filled 2011-11-17: qty 500

## 2011-11-17 MED ORDER — BUTORPHANOL TARTRATE 2 MG/ML IJ SOLN: 1.0000 mg | Freq: Two times a day (BID) | INTRAMUSCULAR | Status: DC | PRN

## 2011-11-17 MED ORDER — LACTATED RINGERS IV BOLUS (SEPSIS)
300.0000 mL | Freq: Once | INTRAVENOUS | Status: AC
  Administered 2011-11-17: 1000 mL via INTRAVENOUS

## 2011-11-17 MED ORDER — MAGNESIUM SULFATE 40 G IN LACTATED RINGERS - SIMPLE
2.5000 g/h | INTRAVENOUS | Status: DC
  Administered 2011-11-18 – 2011-11-19 (×2): 2 g/h via INTRAVENOUS
  Administered 2011-11-20: 2.5 g/h via INTRAVENOUS
  Filled 2011-11-17 (×4): qty 500

## 2011-11-17 NOTE — Consult Note (Signed)
MATERNAL FETAL MEDICINE CONSULT  Patient Name: Carla Haas Medical Record Number:  [id number] Date of Birth: [birth year] Requesting Physician Name:  Michael Litter, MD Date of Service: 11/17/2011  Chief Complaint PPROM and vaginal bleeding  History of Present Illness Carla Haas was seen today due to PPROM and vaginal bleeding at the request of Michael Litter, MD.  The patient is a 26 y.o. [id number],ZH [redacted]w[redacted]d by LMP but 22 weeks 6 days by ultrasound performed yesterday.  Yesterday she experienced a gush and fluid followed by vaginal bleeding that prompted hospitalization.  She was then confirmed to have PPROM and noted to have a placenta previa on ultrasound.  The patient had no prenatal care up until this point and an OB ultrasound performed as part her admission workup revealed a fetus measuring 22 weeks and 4 days, which is a 15 day discrepancy.  Since her initial rupture she has continued to leak fluid and have vaginal bleeding.  She does not report feeling many contractions, but uterine monitoring indicates regular uterine activity.       Review of Systems Pertinent items are noted in HPI.  Patient History OB History    Grav Para Term Preterm Abortions TAB SAB Ect Mult Living   6 3 3  0 2 0 2 0 0 3     # Outc Date GA Lbr Len/2nd Wgt Sex Del Anes PTL Lv   1 TRM 6/07 [redacted]w[redacted]d 12:00 5lb1oz(2.296kg) F SVD None  Yes   Comments: HYPEREMESIS   2 SAB 2008 [redacted]w[redacted]d          3 TRM 3/09 [redacted]w[redacted]d  6lb1oz(2.75kg) M SVD EPI  Yes   Comments: HYPEREMESIS   4 SAB 2012           5 TRM 6/12 [redacted]w[redacted]d  5lb12oz(2.608kg) F SVD EPI  Yes   Comments: HYPEREMSIS   6 CUR               Past Medical History  Diagnosis Date  . Urinary tract infection   . Anxiety 2007    SHORT COURSE OF MEDS  . Anemia     TAKING FE  . Infection     UIT X 1  . Infection     TRICH X 1  . Infection     YEAST X1    Past Surgical History  Procedure Date  . Cholecystectomy     age 50  . Choley     History   Social  History  . Marital Status: Single    Spouse Name: N/A    Number of Children: 3  . Years of Education: 12   Occupational History  .  [employer]   Social History Main Topics  . Smoking status: Never Smoker   . Smokeless tobacco: Never Used  . Alcohol Use: No  . Drug Use: No  . Sexually Active: Yes -- Female partner(s)    Birth Control/ Protection: None, Other-see comments   Other Topics Concern  . None   Social History Narrative   ** Merged History Encounter **     Family History  Problem Relation Age of Onset  . Anesthesia problems Neg Hx   . Alcohol abuse Mother   . Lupus Brother    In addition, the patient has no family history of mental retardation, birth defects, or genetic diseases.  Physical Examination Filed Vitals:   11/17/11 1231  BP:   Pulse:   Temp: 98.3 F (36.8 C)  Resp:  General appearance - alert, well appearing, and in no distress Abdomen - soft, nontender, nondistended, no masses or organomegaly  Assessment and Recommendations 1.  PPROM and placenta previa.  This is a very difficult situation as the overall prognosis for neonatal survival is low.  In addition, her pregnancy dating is suspect given her late entry into prenatal care.  However, the estimated fetal weight is 556 g which is generally considered viable.  Given the lack of clarity in this situation I think the approach of providing comfort care should delivery occur vs. offering aggressive intervention including classical cesarean delivery and full neonatal resuscitation are equally valid and should be guided by the patient's wishes.  Her situation is further complicated by a suspected placenta previa.  A follow up transvaginal ultrasound was performed today to more specifically assess the placental location.  The presence of clot in and around the internal os prevents clear determination of placental location.  However, given the circumstances and the images available cesarean delivery is  recommended if the patient chooses full intervention.  In addition, if full intervention is decided upon the patient should receive a course of betamethasone and be started on magnesium sulfate for neuroprotection if delivery appears likely.  A neonatology consult would also likely be helpful in this situation so that the patient can better understand what potentially lies in store for her baby.   Rema Fendt, MD

## 2011-11-17 NOTE — Progress Notes (Signed)
Pt asleep had just one dose of stadol, FHTs category 1, uc few, RN reports no vag bleeding. Lavera Guise, CNM

## 2011-11-17 NOTE — Progress Notes (Signed)
Patient ID: Carla Haas, female   DOB: October 03, 1985, 26 y.o.   MRN: [id number] Pt without complaints.  No leakage of fluid or VB.  Good FM  BP 109/55  Pulse 67  Temp(Src) 98.3 F (36.8 C) (Oral)  Resp 18  Ht 4\' 10"  (1.473 m)  Wt 105 lb 7 oz (47.826 kg)  BMI 22.04 kg/m2  SpO2 99%  LMP 05/27/2011  Breastfeeding? Yes  FHTS Baseline: 140-150 bpm, Variability: Fair (1-6 bpm), Accelerations: Non-reactive but appropriate for gestational age and Decelerations: Variable: moderate  Toco regular, every 2-5 minutes  Pt in NAD CV RRR Lungs CTAB abd  Gravid soft and NT GU no vb EXt no calf tenderness Results for orders placed during the hospital encounter of 11/14/11 (from the past 72 hour(s))  URINALYSIS, ROUTINE W REFLEX MICROSCOPIC     Status: Abnormal   Collection Time   11/14/11  9:22 PM      Component Value Range Comment   Color, Urine YELLOW  YELLOW     APPearance CLEAR  CLEAR     Specific Gravity, Urine 1.020  1.005 - 1.030     pH 6.5  5.0 - 8.0     Glucose, UA NEGATIVE  NEGATIVE (mg/dL)    Hgb urine dipstick NEGATIVE  NEGATIVE     Bilirubin Urine SMALL (*) NEGATIVE     Ketones, ur >80 (*) NEGATIVE (mg/dL)    Protein, ur NEGATIVE  NEGATIVE (mg/dL)    Urobilinogen, UA 2.0 (*) 0.0 - 1.0 (mg/dL)    Nitrite NEGATIVE  NEGATIVE     Leukocytes, UA SMALL (*) NEGATIVE    URINE MICROSCOPIC-ADD ON     Status: Abnormal   Collection Time   11/14/11  9:22 PM      Component Value Range Comment   Squamous Epithelial / LPF FEW (*) RARE     WBC, UA 3-6  <3 (WBC/hpf)    RBC / HPF 0-2  <3 (RBC/hpf)    Bacteria, UA FEW (*) RARE     Urine-Other MUCOUS PRESENT     CBC     Status: Abnormal   Collection Time   11/14/11 11:00 PM      Component Value Range Comment   WBC 11.8 (*) 4.0 - 10.5 (K/uL)    RBC 3.63 (*) 3.87 - 5.11 (MIL/uL)    Hemoglobin 9.9 (*) 12.0 - 15.0 (g/dL)    HCT 40.9 (*) 81.1 - 46.0 (%)    MCV 83.5  78.0 - 100.0 (fL)    MCH 27.3  26.0 - 34.0 (pg)    MCHC 32.7  30.0 - 36.0  (g/dL)    RDW 91.4  78.2 - 95.6 (%)    Platelets 295  150 - 400 (K/uL)   COMPREHENSIVE METABOLIC PANEL     Status: Abnormal   Collection Time   11/14/11 11:00 PM      Component Value Range Comment   Sodium 139  135 - 145 (mEq/L)    Potassium 3.4 (*) 3.5 - 5.1 (mEq/L)    Chloride 104  96 - 112 (mEq/L)    CO2 23  19 - 32 (mEq/L)    Glucose, Bld 87  70 - 99 (mg/dL)    BUN 7  6 - 23 (mg/dL)    Creatinine, Ser 2.13  0.50 - 1.10 (mg/dL)    Calcium 8.4  8.4 - 10.5 (mg/dL)    Total Protein 6.5  6.0 - 8.3 (g/dL)    Albumin 3.0 (*) 3.5 - 5.2 (  g/dL)    AST 16  0 - 37 (U/L)    ALT 10  0 - 35 (U/L)    Alkaline Phosphatase 56  39 - 117 (U/L)    Total Bilirubin 0.5  0.3 - 1.2 (mg/dL)    GFR calc non Af Amer >90  >90 (mL/min)    GFR calc Af Amer >90  >90 (mL/min)   PREALBUMIN     Status: Abnormal   Collection Time   11/14/11 11:00 PM      Component Value Range Comment   Prealbumin 15.6 (*) 17.0 - 34.0 (mg/dL)   WET PREP, GENITAL     Status: Abnormal   Collection Time   11/14/11 11:28 PM      Component Value Range Comment   Yeast Wet Prep HPF POC NONE SEEN  NONE SEEN     Trich, Wet Prep NONE SEEN  NONE SEEN     Clue Cells Wet Prep HPF POC NONE SEEN  NONE SEEN     WBC, Wet Prep HPF POC FEW (*) NONE SEEN  MODERATE BACTERIA SEEN  GC/CHLAMYDIA PROBE AMP, GENITAL     Status: Normal   Collection Time   11/14/11 11:28 PM      Component Value Range Comment   GC Probe Amp, Genital NEGATIVE  NEGATIVE     Chlamydia, DNA Probe NEGATIVE  NEGATIVE    CULTURE, BETA STREP (GROUP B ONLY)     Status: Normal   Collection Time   11/14/11 11:28 PM      Component Value Range Comment   Specimen Description VAGINAL/RECTAL      Special Requests NONE      Culture NO GROUP B STREP (S.AGALACTIAE) ISOLATED      Report Status 11/17/2011 FINAL     URINALYSIS, ROUTINE W REFLEX MICROSCOPIC     Status: Abnormal   Collection Time   11/15/11  1:25 AM      Component Value Range Comment   Color, Urine YELLOW  YELLOW      APPearance CLEAR  CLEAR     Specific Gravity, Urine 1.025  1.005 - 1.030     pH 6.0  5.0 - 8.0     Glucose, UA NEGATIVE  NEGATIVE (mg/dL)    Hgb urine dipstick NEGATIVE  NEGATIVE     Bilirubin Urine NEGATIVE  NEGATIVE     Ketones, ur 40 (*) NEGATIVE (mg/dL)    Protein, ur NEGATIVE  NEGATIVE (mg/dL)    Urobilinogen, UA 1.0  0.0 - 1.0 (mg/dL)    Nitrite NEGATIVE  NEGATIVE     Leukocytes, UA NEGATIVE  NEGATIVE  MICROSCOPIC NOT DONE ON URINES WITH NEGATIVE PROTEIN, BLOOD, LEUKOCYTES, NITRITE, OR GLUCOSE <1000 mg/dL.  URINALYSIS, DIPSTICK ONLY     Status: Abnormal   Collection Time   11/15/11  4:40 AM      Component Value Range Comment   Specific Gravity, Urine 1.025  1.005 - 1.030     pH 6.0  5.0 - 8.0     Glucose, UA 500 (*) NEGATIVE (mg/dL)    Hgb urine dipstick NEGATIVE  NEGATIVE     Bilirubin Urine NEGATIVE  NEGATIVE     Ketones, ur 40 (*) NEGATIVE (mg/dL)    Protein, ur NEGATIVE  NEGATIVE (mg/dL)    Urobilinogen, UA 1.0  0.0 - 1.0 (mg/dL)    Nitrite NEGATIVE  NEGATIVE     Leukocytes, UA NEGATIVE  NEGATIVE    TSH     Status: Abnormal   Collection  Time   11/15/11  7:20 AM      Component Value Range Comment   TSH 0.133 (*) 0.350 - 4.500 (uIU/mL)   T4, FREE     Status: Normal   Collection Time   11/16/11  5:26 AM      Component Value Range Comment   Free T4 1.05  0.80 - 1.80 (ng/dL)   URINALYSIS, ROUTINE W REFLEX MICROSCOPIC     Status: Abnormal   Collection Time   11/16/11  9:05 AM      Component Value Range Comment   Color, Urine YELLOW  YELLOW     APPearance CLEAR  CLEAR     Specific Gravity, Urine 1.010  1.005 - 1.030     pH 7.5  5.0 - 8.0     Glucose, UA NEGATIVE  NEGATIVE (mg/dL)    Hgb urine dipstick NEGATIVE  NEGATIVE     Bilirubin Urine NEGATIVE  NEGATIVE     Ketones, ur NEGATIVE  NEGATIVE (mg/dL)    Protein, ur NEGATIVE  NEGATIVE (mg/dL)    Urobilinogen, UA 2.0 (*) 0.0 - 1.0 (mg/dL)    Nitrite NEGATIVE  NEGATIVE     Leukocytes, UA NEGATIVE  NEGATIVE  MICROSCOPIC  NOT DONE ON URINES WITH NEGATIVE PROTEIN, BLOOD, LEUKOCYTES, NITRITE, OR GLUCOSE <1000 mg/dL.  WET PREP, GENITAL     Status: Abnormal   Collection Time   11/16/11  9:45 PM      Component Value Range Comment   Yeast Wet Prep HPF POC NONE SEEN  NONE SEEN     Trich, Wet Prep NONE SEEN  NONE SEEN     Clue Cells Wet Prep HPF POC NONE SEEN  NONE SEEN     WBC, Wet Prep HPF POC FEW (*) NONE SEEN  NONE SEEN  CBC     Status: Abnormal   Collection Time   11/17/11  1:20 AM      Component Value Range Comment   WBC 14.4 (*) 4.0 - 10.5 (K/uL)    RBC 3.35 (*) 3.87 - 5.11 (MIL/uL)    Hemoglobin 9.2 (*) 12.0 - 15.0 (g/dL)    HCT 16.1 (*) 09.6 - 46.0 (%)    MCV 83.6  78.0 - 100.0 (fL)    MCH 27.5  26.0 - 34.0 (pg)    MCHC 32.9  30.0 - 36.0 (g/dL)    RDW 04.5  40.9 - 81.1 (%)    Platelets 281  150 - 400 (K/uL)   TYPE AND SCREEN     Status: Normal   Collection Time   11/17/11  1:20 AM      Component Value Range Comment   ABO/RH(D) A POS      Antibody Screen NEG      Sample Expiration 11/20/2011       Assessment and Plan [redacted]w[redacted]d  CONTRACTIONS WITH A Previa or possible abruption Pt will have [place] by MFM.  Unsure of dates but because pt desires all to be done.  Will start Magnesium and give betamethasone Monitor vaginal bleeding.   Continue IVF and phenergan for the hyperemesis

## 2011-11-17 NOTE — Progress Notes (Signed)
S:patient is now much more comfortable after a single dose of Stadol and does not complaining of any significant pain. O:BP 109/58  Pulse 71  Temp(Src) 98.6 F (37 C) (Oral)  Resp 22  Ht 4\' 10"  (1.473 m)  Wt 105 lb 7 oz (47.826 kg)  BMI 22.04 kg/m2  SpO2 99%  LMP 05/27/2011  Breastfeeding? Yes Final ultrasound results: Best gestational age [redacted] weeks and 5 days abdominal circumference measures 23 weeks and 0 days but it was admitted to be a difficult measurement femur length measures 23 weeks and 1 day.placenta previa with a possible clot representative of abruption. Presentation is compound with the vertex anterior to the remainder of the body and slightly in the left oblique position. EFW: [id number] A:  Intrauterine pregnancy at a gestation somewhere between 22 weeks and 5 days and 23 weeks and 1 day. This is clearly on the cusp of viability with survival in this gestational age range having a very poor prognosis. P:1) I discussed the patient with the MFM on-call consultant from [facility] who stated that for infants at this unpredictable gestational age the estimated fetal weight was in fact are taken into consideration. He felt that with this infant just above 500 g it was reasonable to utilize antibiotics in the usual protocol for preterm premature rupture of membrane.  He did not however recommend the utilization of magnesium or betamethasone at this point. He also however recommended that the parents being involved in the decision about the delivery of the infant.   I spoke with the on-call neonatologist to make him aware of the patient's current status and received information that in our nursery the survival rate for infants at [redacted] weeks gestation with good prenatal care was 25%. This did not factor in any comorbidities of prematurity only survival. I shared the above information with the parents about the poor prognosis for an infant born at this gestational age.  I  discussed  the importance of the fact that it is unlikely that the 24 weeks and 6 day gestation calculated based on an uncertain last menstrual period of 05/27/2011 is accurate.  This bleeding was less than 4 weeks after  an ultrasound was performed on 11/ 712showing a viable 7 week 5 day gestation which subsequently underwent spontaneous abortion, and therefore is unlikely to be reliable. In spite of this information, the parents say they want to respond to fetal distress with cesarean section in an effort to give their infant whatever small chance of survival is available. 2) Antibiotics 3) Discussed with parents that steroids were not indicated at this gestational age, but would be indicated if she reaches 23w 6d. 4) will plan formal MFM consult in am 5)type and screen

## 2011-11-17 NOTE — Progress Notes (Signed)
Hospital day # 3 pregnancy at [redacted]w[redacted]d by dates, 22 6/7 by [place] yesterday, with SROM approx 11pm last night, previa.  S:  Requests liquids.      Perception of contractions: Denies      Vaginal bleeding: Passed one 3 cm clot this am, now small amount dark red bleeding at times.       Vaginal discharge:  None  O: BP 109/55  Pulse 67  Temp(Src) 98.8 F (37.1 C) (Oral)  Resp 18  Ht 4\' 10"  (1.473 m)  Wt 105 lb 7 oz (47.826 kg)  BMI 22.04 kg/m2  SpO2 99%  LMP 05/27/2011  Breastfeeding? Yes      Fetal tracings:  FHR 150, reassuring      Contractions:   On [facility] monitor, appears to have sporadic contractions (4-6 this hour)      Uterus non-tender      Extremities: no significant edema and no signs of DVT          Labs:   Results for orders placed during the hospital encounter of 11/14/11 (from the past 24 hour(s))  WET PREP, GENITAL     Status: Abnormal   Collection Time   11/16/11  9:45 PM      Component Value Range   Yeast Wet Prep HPF POC NONE SEEN  NONE SEEN    Trich, Wet Prep NONE SEEN  NONE SEEN    Clue Cells Wet Prep HPF POC NONE SEEN  NONE SEEN    WBC, Wet Prep HPF POC FEW (*) NONE SEEN   CBC     Status: Abnormal   Collection Time   11/17/11  1:20 AM      Component Value Range   WBC 14.4 (*) 4.0 - 10.5 (K/uL)   RBC 3.35 (*) 3.87 - 5.11 (MIL/uL)   Hemoglobin 9.2 (*) 12.0 - 15.0 (g/dL)   HCT 16.1 (*) 09.6 - 46.0 (%)   MCV 83.6  78.0 - 100.0 (fL)   MCH 27.5  26.0 - 34.0 (pg)   MCHC 32.9  30.0 - 36.0 (g/dL)   RDW 04.5  40.9 - 81.1 (%)   Platelets 281  150 - 400 (K/uL)  TYPE AND SCREEN     Status: Normal   Collection Time   11/17/11  1:20 AM      Component Value Range   ABO/RH(D) A POS     Antibody Screen NEG     Sample Expiration 11/20/2011            Meds: Antibiotics po and IV for latency for PROM  A: [redacted]w[redacted]d with PROM, previa     ? Uterine activity  P: Continue current plan of care      MDs will follow      Close observation of status.  Nigel Bridgeman CNM,  [place] 11/17/2011 10:47 AM

## 2011-11-17 NOTE — Progress Notes (Signed)
Diff tracing due to [facility]

## 2011-11-18 DIAGNOSIS — O4290 Premature rupture of membranes, unspecified as to length of time between rupture and onset of labor, unspecified weeks of gestation: Secondary | ICD-10-CM

## 2011-11-18 LAB — DIFFERENTIAL
Eosinophils Relative: 0 % (ref 0–5)
Lymphocytes Relative: 26 % (ref 12–46)
Lymphs Abs: 3.7 10*3/uL (ref 0.7–4.0)
Monocytes Absolute: 0.9 10*3/uL (ref 0.1–1.0)
Monocytes Relative: 6 % (ref 3–12)
Neutro Abs: 9.8 10*3/uL — ABNORMAL HIGH (ref 1.7–7.7)

## 2011-11-18 LAB — CBC
HCT: 28.4 % — ABNORMAL LOW (ref 36.0–46.0)
Hemoglobin: 9.3 g/dL — ABNORMAL LOW (ref 12.0–15.0)
MCV: 83.8 fL (ref 78.0–100.0)
RBC: 3.39 MIL/uL — ABNORMAL LOW (ref 3.87–5.11)
WBC: 14.4 10*3/uL — ABNORMAL HIGH (ref 4.0–10.5)

## 2011-11-18 LAB — GC/CHLAMYDIA PROBE AMP, GENITAL: Chlamydia, DNA Probe: NEGATIVE

## 2011-11-18 NOTE — Progress Notes (Signed)
Pt would like IV to be DC and restarted

## 2011-11-18 NOTE — Progress Notes (Signed)
Patient ID: Carla Haas, female   DOB: 04/03/1986, 26 y.o.   MRN: [id number] Carla Haas is a 26 y.o. Carla Haas at [redacted]w[redacted]d admitted for hyperemesis and had PPROM with bleeding.    Hospital Day No: 5  Subjective: Pt is currently stable on MgSO4 and undergoing BMZ series.  She denies any bleeding right now and is resting comfortably.  Objective: BP 105/58  Pulse 83  Temp(Src) 98.4 F (36.9 C) (Oral)  Resp 18  Ht 4\' 10"  (1.473 m)  Wt 105 lb 7 oz (47.826 kg)  BMI 22.04 kg/m2  SpO2 99%  LMP 05/27/2011  Breastfeeding? Yes I/O last 3 completed shifts: In: 6476.2 [P.O.:2060; I.V.:3996.2; IV Piggyback:420] Out: 4780 [Urine:4780]    Physical Exam:  Gen: resting Chest/Lungs: cta bilaterally  Heart/Pulse: RRR  Abdomen: soft, nontender EXT: negative Homan's b/l, edema   FHT:  130 UC:   none SVE:   Dilation: 1 Effacement (%): Thick Exam by:: Carla Haas cnm  Labs: Lab Results  Component Value Date   WBC 14.4* 11/18/2011   HGB 9.3* 11/18/2011   HCT 28.4* 11/18/2011   MCV 83.8 11/18/2011   PLT 278 11/18/2011    Assessment and Plan: P3 at 25wks with questionable dating.  Currently with PPROM, Bleeding which has subsided and ?Previa although clot is also suspected.  Continue observation in hosp.  Pt wants everything and if there is any signs of fetal or maternal compromise, pt is agreeable to proceed with c-section.   Purcell Nails 11/18/2011, 8:28 PM

## 2011-11-18 NOTE — Progress Notes (Signed)
Clinical Social Work Department ANTENATAL PSYCHOSOCIAL ASSESSMENT 11/18/2011  Patient:  Carla Haas, Carla Haas   Account Number:  [id number]  Admit Date:  11/14/2011     DOB:  09/08/1985   Age:  26 Gestational age on admission:  25     Expected delivery date:  03/08/2012 Admitting diagnosis:   IUP at 24wks  Hyperemesis    Clinical Social Worker:  Truman Hayward,  [place]  Date/Time:  11/18/2011 10:30 AM  FAMILY/HOME ENVIRONMENT  Home address:   [address] [place], [place] [address]   Household Member/Support Name Relationship Age  Oren Section SIGNIFICANT OTHER    Other support:   Brother, Thereasa Solo  Nino Glow     PSYCHOSOCIAL DATA  Information source:  Patient Interview Other information source:   Chart Review    Resources:   Supportive family  no current concerns for supplies for infant   Employment:   Medicaid (county):  [employer]  School:     Current grade:    Homebound arranged?    Other Resourses  Medicaid    Cultural/Environmental issues impacting care:   N/A    STRENGTHS / WEAKNESSES / FACTORS TO CONSIDER  Concerns related to hospitalization:   No current safety concerns for pt.  Pt only had questions about Food stamp and other financial assistance.   Previous pregnancies/feelings towards pregnancy?  Concerns related to being/becoming a mother?   Pt seems appropriate with emotions and is excited for her infant to be born, however she is not sure whether she will have infant early or not.   Social support (FOB? Who is/will be helping with baby/other kids)   Per pt she has several family members as well as FOB who plan to help support pt when infant is born.   Couples relationship:   Per pt FOB supportive, no current concerns   Recent stressful life events (life changes in past year?):   none reported   Prenatal care/education/home preparations?   Plans to begin Prenatal Services with [facility]   Domestic violence  (of any type):  N If yes to domestic violence describe/action plan:  Substance use during pregnancy.  (If YES, complete SBIRT):  N  Complete PHQ-9 (Depression Screening) on all antenatal patients.  PHQ-9 score:   3 (IF SCORE => 15 complete TREAT)  Follow up recommendations:   Follow-up with [facility] for continued prenatal care after discharge.  Report any concerns of anxiety to MD or hospital RN if in the hospital.   Patient advised/response?   Per expressed understanding to let a healthcare provider know if any concerns arise.   Other:    Clinical Assessment/Plan CSW spoke with MOB at bedside.  Pt was appropriate and did not indicate any current concerns.  Pt did ask for some assistance with foodstamps.  Provided application for pt and phone number for DSS so that pt can contact Caseworker through DSS for further assistance.  Pt plans to use [facility] for further prenatal care and does not report any concerns with medicaid to begin.  Please contact CSW if any further needs arise.

## 2011-11-18 NOTE — Consult Note (Addendum)
Asked by Dr Normand Sloop to speak to Carla Haas to discuss outcome of prematurity at current gestation. Chart reviewed. Briefly, she is 23 wks by [place], 25 by dates. Pregnancy complicated by late [facility],  PROM on 5/23, vaginal bleeding, and placenta previa. EFW: 556 gms.   Carla Boehlke desires for all to be done for the baby. Meds: Magnesium sulfate for neuroprotection and has just received 2nd dose of betamethasone.  As there is discrepancy in dating, I discussed survival rate of 23 wk preterm vs 25 wk preterm, then proceeded to discuss resuscitation. I discussed full evaluation of the baby's maturity after birth and stabilization. Discussed morbidities commonly seen in 25 wk preterm, eg RDS requiring vent support and surfactant, HAL, high risk for IVH and ROP, generalized organ immaturity, and risk of neurodevelopmental issues. Discussed decreasing morbidity with increasing gestation.   Encouraged breastfeeding and discussed  benefits.  Questions answered.  Thank you for this consult.  Analiese Krupka Q  Face to face 30 min.

## 2011-11-18 NOTE — Progress Notes (Signed)
C/o of pain at site. No sign of infiltration noted. Will continue to assess

## 2011-11-18 NOTE — Progress Notes (Signed)
Pt. Sitting up in bed. Will let pt. Eat meal and readjust external monitor

## 2011-11-18 NOTE — Progress Notes (Signed)
Error with OBIX transfer. Refer to MRN number [id number] for additional documentation on FHR tracing during times of 0800-0921 on 11/18/2011. Corrected at this time. Correct MRN for patient is [id number].

## 2011-11-19 LAB — DIFFERENTIAL
Basophils Absolute: 0 10*3/uL (ref 0.0–0.1)
Eosinophils Relative: 0 % (ref 0–5)
Lymphocytes Relative: 23 % (ref 12–46)
Lymphs Abs: 3.5 10*3/uL (ref 0.7–4.0)
Monocytes Absolute: 1.5 10*3/uL — ABNORMAL HIGH (ref 0.1–1.0)
Monocytes Relative: 10 % (ref 3–12)
Neutro Abs: 10 10*3/uL — ABNORMAL HIGH (ref 1.7–7.7)

## 2011-11-19 LAB — CBC
HCT: 27.4 % — ABNORMAL LOW (ref 36.0–46.0)
Hemoglobin: 8.9 g/dL — ABNORMAL LOW (ref 12.0–15.0)
MCV: 84.6 fL (ref 78.0–100.0)
RBC: 3.24 MIL/uL — ABNORMAL LOW (ref 3.87–5.11)
WBC: 15 10*3/uL — ABNORMAL HIGH (ref 4.0–10.5)

## 2011-11-19 MED ORDER — ONDANSETRON HCL 4 MG/2ML IJ SOLN: 4.0000 mg | Freq: Three times a day (TID) | INTRAMUSCULAR | Status: DC | PRN

## 2011-11-19 MED ORDER — PROMETHAZINE HCL 25 MG/ML IJ SOLN: 25.0000 mg | Freq: Four times a day (QID) | INTRAMUSCULAR | Status: DC | PRN

## 2011-11-19 NOTE — Progress Notes (Signed)
Dr Su Hilt reviewing FHr tracing. Informed again on scant vaginal bleeding, UC freq, lack of pt pain and lack of abdominal tenderness , and FHR tracing. Continue with POC with no orders at this time.

## 2011-11-19 NOTE — Progress Notes (Addendum)
Patient ID: Carla Haas, female   DOB: 05-11-1986, 26 y.o.   MRN: [id number] Carla Haas is a 26 y.o. [id number] at [redacted]w[redacted]d admitted for hyperemesis and found to have PPROM and vaginal bleeding with ?Previa vs Clot near internal os.  Caller by RN secondary to pt with irregular but frequent ctxs on the monitor but pt not feeling them and there is no increased bleeding and no change in d/c.  The bleeding is actually significantly improved with only scant bleeding noted.   Subjective: Pt has no complaints.  Denies ctxs, bleeding is scant.  She is resting comfortably.  No N/V.  Objective: BP 115/54  Pulse 82  Temp(Src) 98.6 F (37 C) (Oral)  Resp 18  Ht 4\' 10"  (1.473 m)  Wt 105 lb 7 oz (47.826 kg)  BMI 22.04 kg/m2  SpO2 99%  LMP 05/27/2011  Breastfeeding? Yes I/O last 3 completed shifts: In: 7410.5 [P.O.:2510; I.V.:4642.5; IV Piggyback:258] Out: 6430 [Urine:6430] Total I/O In: 889 [P.O.:250; I.V.:637; IV Piggyback:2] Out: 1000 [Urine:1000]  Physical Exam:  Gen: alert and resting Chest/Lungs: cta bilaterally  Heart/Pulse: RRR  Abdomen: soft, gravid, nontender Uterine fundus: soft, nontender Skin & Color: warm and dry  Neurological: AOx3, DTRs 1+ EXT: negative Homan's b/l, edema tr  FHT:  FHR: 120s bpm, variability: moderate,  accelerations:  Present,  decelerations:  Absent UC:   regular, every 2 minutes with irritability SVE:   deferred  Labs: Lab Results  Component Value Date   WBC 15.0* 11/19/2011   HGB 8.9* 11/19/2011   HCT 27.4* 11/19/2011   MCV 84.6 11/19/2011   PLT 261 11/19/2011    Assessment and Plan: P3 at 25 1/7wks with PPROM and stable bleeding.  Repeat u/s 11/24/11 at MFM and check CBC now secondary to increased ctxs - ?concealed abruption.  Fetal status is overall reassuring.  Will cont to monitor closely.  If heavy bleeding, signs of labor, fetal or maternal compromise, I discussed delivery via c-section with the patient and she is agreeable.  Currently on  MgSO4 to receive BMZ.  Initial plan to d/c 24hrs after 2nd dose of BMZ.  I discussed this with the patient as well but in light of increased ctxs, may cont until the morning.  Recheck CBC in am.  Osborn Coho Y 11/19/2011, 1:15 PM

## 2011-11-19 NOTE — Progress Notes (Signed)
Dr Su Hilt notified of pt UC freq, lack of pain, amt of mag, amt of vaginal bleeding, and pt voided. Order to increase mag

## 2011-11-20 ENCOUNTER — Encounter (HOSPITAL_COMMUNITY): Payer: Self-pay | Admitting: Anesthesiology

## 2011-11-20 ENCOUNTER — Encounter (HOSPITAL_COMMUNITY)
Admission: AD | Disposition: A | Discharge status: Home / Self Care | Payer: Self-pay | Source: Ambulatory Visit | Attending: Obstetrics and Gynecology

## 2011-11-20 ENCOUNTER — Inpatient Hospital Stay (HOSPITAL_COMMUNITY): Payer: Medicaid Other | Admitting: Anesthesiology

## 2011-11-20 DIAGNOSIS — O208 Other hemorrhage in early pregnancy: Secondary | ICD-10-CM

## 2011-11-20 DIAGNOSIS — O4290 Premature rupture of membranes, unspecified as to length of time between rupture and onset of labor, unspecified weeks of gestation: Secondary | ICD-10-CM

## 2011-11-20 DIAGNOSIS — O4590 Premature separation of placenta, unspecified, unspecified trimester: Secondary | ICD-10-CM

## 2011-11-20 LAB — PREPARE RBC (CROSSMATCH)

## 2011-11-20 LAB — CBC
HCT: 25.8 % — ABNORMAL LOW (ref 36.0–46.0)
Hemoglobin: 8.3 g/dL — ABNORMAL LOW (ref 12.0–15.0)
MCH: 27.6 pg (ref 26.0–34.0)
MCHC: 32.2 g/dL (ref 30.0–36.0)
MCV: 85.7 fL (ref 78.0–100.0)

## 2011-11-20 SURGERY — Surgical Case
Anesthesia: Spinal | Site: Abdomen | Wound class: Clean Contaminated

## 2011-11-20 MED ORDER — FENTANYL CITRATE 0.05 MG/ML IJ SOLN
INTRAMUSCULAR | Status: AC
  Filled 2011-11-20: qty 2

## 2011-11-20 MED ORDER — METOCLOPRAMIDE HCL 5 MG/ML IJ SOLN
10.0000 mg | Freq: Once | INTRAMUSCULAR | Status: AC
  Administered 2011-11-20: 10 mg via INTRAVENOUS
  Filled 2011-11-20: qty 2

## 2011-11-20 MED ORDER — FAMOTIDINE IN NACL 20-0.9 MG/50ML-% IV SOLN
20.0000 mg | Freq: Once | INTRAVENOUS | Status: AC
  Administered 2011-11-20: 20 mg via INTRAVENOUS
  Filled 2011-11-20: qty 50

## 2011-11-20 MED ORDER — ONDANSETRON HCL 4 MG/2ML IJ SOLN
INTRAMUSCULAR | Status: DC | PRN
  Administered 2011-11-20: 4 mg via INTRAVENOUS

## 2011-11-20 MED ORDER — MAGNESIUM SULFATE 40 G IN LACTATED RINGERS - SIMPLE
1.0000 g/h | INTRAVENOUS | Status: AC
  Filled 2011-11-20: qty 500

## 2011-11-20 MED ORDER — LACTATED RINGERS IV SOLN
20.0000 [IU] | INTRAVENOUS | Status: DC | PRN
  Administered 2011-11-20: 20 [IU] via INTRAVENOUS

## 2011-11-20 MED ORDER — MAGNESIUM SULFATE 40 G IN LACTATED RINGERS - SIMPLE
2.0000 g/h | INTRAVENOUS | Status: AC
  Filled 2011-11-20: qty 500

## 2011-11-20 MED ORDER — FENTANYL CITRATE 0.05 MG/ML IJ SOLN
INTRAMUSCULAR | Status: DC | PRN
  Administered 2011-11-20: 15 ug via INTRATHECAL
  Administered 2011-11-20: 50 ug via INTRAVENOUS
  Administered 2011-11-21: 75 ug via INTRAVENOUS
  Administered 2011-11-21: 25 ug via INTRAVENOUS
  Administered 2011-11-21: 35 ug via INTRAVENOUS
  Administered 2011-11-21 (×2): 25 ug via INTRAVENOUS

## 2011-11-20 MED ORDER — MIDAZOLAM HCL 5 MG/5ML IJ SOLN
INTRAMUSCULAR | Status: DC | PRN
  Administered 2011-11-20: 2 mg via INTRAVENOUS

## 2011-11-20 MED ORDER — MIDAZOLAM HCL 2 MG/2ML IJ SOLN
INTRAMUSCULAR | Status: AC
  Filled 2011-11-20: qty 2

## 2011-11-20 MED ORDER — MORPHINE SULFATE 0.5 MG/ML IJ SOLN
INTRAMUSCULAR | Status: AC
  Filled 2011-11-20: qty 10

## 2011-11-20 MED ORDER — CITRIC ACID-SODIUM CITRATE 334-500 MG/5ML PO SOLN
ORAL | Status: AC
  Administered 2011-11-20: 30 mL
  Filled 2011-11-20: qty 15

## 2011-11-20 SURGICAL SUPPLY — 45 items
APL SKNCLS STERI-STRIP NONHPOA (GAUZE/BANDAGES/DRESSINGS) ×1
BARRIER ADHS 3X4 INTERCEED (GAUZE/BANDAGES/DRESSINGS) ×2 IMPLANT
BENZOIN TINCTURE PRP APPL 2/3 (GAUZE/BANDAGES/DRESSINGS) ×2 IMPLANT
BRR ADH 4X3 ABS CNTRL BYND (GAUZE/BANDAGES/DRESSINGS) ×2
CHLORAPREP W/TINT 26ML (MISCELLANEOUS) ×2 IMPLANT
CLOTH BEACON ORANGE TIMEOUT ST (SAFETY) ×2 IMPLANT
CONTAINER PREFILL 10% NBF 15ML (MISCELLANEOUS) IMPLANT
DRSG COVADERM 4X10 (GAUZE/BANDAGES/DRESSINGS) ×1 IMPLANT
ELECT REM PT RETURN 9FT ADLT (ELECTROSURGICAL) ×2
ELECTRODE REM PT RTRN 9FT ADLT (ELECTROSURGICAL) ×1 IMPLANT
EXTRACTOR VACUUM M CUP 4 TUBE (SUCTIONS) IMPLANT
GLOVE BIO SURGEON STRL SZ7 (GLOVE) ×1 IMPLANT
GLOVE BIO SURGEON STRL SZ7.5 (GLOVE) ×4 IMPLANT
GLOVE BIOGEL PI IND STRL 7.5 (GLOVE) ×1 IMPLANT
GLOVE BIOGEL PI INDICATOR 7.5 (GLOVE) ×2
GLOVE SKINSENSE NS SZ7.5 (GLOVE) ×1
GLOVE SKINSENSE NS SZ8.0 LF (GLOVE) ×1
GLOVE SKINSENSE STRL SZ7.5 (GLOVE) IMPLANT
GLOVE SKINSENSE STRL SZ8.0 LF (GLOVE) IMPLANT
GOWN PREVENTION PLUS LG XLONG (DISPOSABLE) ×6 IMPLANT
KIT ABG SYR 3ML LUER SLIP (SYRINGE) ×2 IMPLANT
NDL HYPO 25X5/8 SAFETYGLIDE (NEEDLE) IMPLANT
NEEDLE HYPO 22GX1.5 SAFETY (NEEDLE) IMPLANT
NEEDLE HYPO 25X5/8 SAFETYGLIDE (NEEDLE) ×4 IMPLANT
NS IRRIG 1000ML POUR BTL (IV SOLUTION) ×2 IMPLANT
PACK C SECTION WH (CUSTOM PROCEDURE TRAY) ×2 IMPLANT
RETAINER VISCERAL (MISCELLANEOUS) ×1 IMPLANT
RETRACTOR WND ALEXIS 25 LRG (MISCELLANEOUS) ×1 IMPLANT
RTRCTR WOUND ALEXIS 25CM LRG (MISCELLANEOUS) ×2
SLEEVE SCD COMPRESS KNEE MED (MISCELLANEOUS) ×1 IMPLANT
SPONGE LAP 18X18 X RAY DECT (DISPOSABLE) ×3 IMPLANT
STRIP CLOSURE SKIN 1/2X4 (GAUZE/BANDAGES/DRESSINGS) ×2 IMPLANT
SUT CHROMIC 2 0 CT 1 (SUTURE) ×2 IMPLANT
SUT MNCRL AB 3-0 PS2 27 (SUTURE) ×2 IMPLANT
SUT PLAIN 0 NONE (SUTURE) IMPLANT
SUT PLAIN 2 0 XLH (SUTURE) ×2 IMPLANT
SUT VIC AB 0 CT1 36 (SUTURE) ×2 IMPLANT
SUT VIC AB 0 CTX 36 (SUTURE) ×10
SUT VIC AB 0 CTX36XBRD ANBCTRL (SUTURE) ×3 IMPLANT
SUT VIC AB 3-0 SH 27 (SUTURE) ×6
SUT VIC AB 3-0 SH 27X BRD (SUTURE) IMPLANT
SYR CONTROL 10ML LL (SYRINGE) IMPLANT
TOWEL OR 17X24 6PK STRL BLUE (TOWEL DISPOSABLE) ×4 IMPLANT
TRAY FOLEY CATH 14FR (SET/KITS/TRAYS/PACK) ×2 IMPLANT
WATER STERILE IRR 1000ML POUR (IV SOLUTION) ×1 IMPLANT

## 2011-11-20 NOTE — Progress Notes (Signed)
Ur chart review completed.  

## 2011-11-20 NOTE — Anesthesia Preprocedure Evaluation (Addendum)
Anesthesia Evaluation  Patient identified by MRN, date of birth, ID band Patient awake    Reviewed: Allergy & Precautions, H&P , NPO status , Patient's Chart, lab work & pertinent test results, reviewed documented beta blocker date and time   History of Anesthesia Complications Negative for: history of anesthetic complications  Airway Mallampati: II TM Distance: >3 FB Neck ROM: full    Dental  (+) Chipped   Pulmonary neg pulmonary ROS,  breath sounds clear to auscultation        Cardiovascular negative cardio ROS  Rhythm:regular Rate:Normal     Neuro/Psych negative neurological ROS  negative psych ROS   GI/Hepatic negative GI ROS, Neg liver ROS,   Endo/Other  negative endocrine ROS  Renal/GU negative Renal ROS  negative genitourinary   Musculoskeletal   Abdominal   Peds  Hematology  (+) Blood dyscrasia (last hgb 8.3 ), anemia ,   Anesthesia Other Findings NPO status - last ate at 2215 - ordered reglan and pepcid IV and bicitra PO  Reproductive/Obstetrics (+) Pregnancy (placenta previa, PPROM, bleeding and PTL.  No prenatal care)                         Anesthesia Physical Anesthesia Plan  ASA: II and Emergent  Anesthesia Plan: Spinal   Post-op Pain Management:    Induction:   Airway Management Planned:   Additional Equipment:   Intra-op Plan:   Post-operative Plan:   Informed Consent: I have reviewed the patients History and Physical, chart, labs and discussed the procedure including the risks, benefits and alternatives for the proposed anesthesia with the patient or authorized representative who has indicated his/her understanding and acceptance.     Plan Discussed with: CRNA and Surgeon  Anesthesia Plan Comments:         Anesthesia Quick Evaluation

## 2011-11-20 NOTE — Progress Notes (Signed)
Patient ID: Carla Haas, female   DOB: 09/05/1985, 26 y.o.   MRN: [id number]  Hospital day # 6 pregnancy at [redacted]w[redacted]d  S: Called to see pt by RN due to vaginal bleeding.  Pt had been experiencing light spotting until the last 1hr.  She got up to go the bathroom and had a gush of bright red bleeding which has partially saturated a pad.  Pt states she is beginning to feel increased UC intensity.  Last food at 10:15pm.  Pos FM.  Had been continued to leak fluid as well prior to the onset of bleeding.  O: BP 89/60  Pulse 71  Temp(Src) 98.7 F (37.1 C) (Oral)  Resp 20  Ht 4\' 10"  (1.473 m)  Wt 51.256 kg (113 lb)  BMI 23.62 kg/m2  SpO2 99%  LMP 05/27/2011  Breastfeeding? Yes      Fetal tracings:Fetal heart variability: moderate, baseline 160bpm at present, increased from 145bpm. Variable decels noted.      Uterus soft with palpable UCs every 2-3 minutes, moderate to palpation.  Pt grimacing with UCs.        Blue pad with 8-10cm area of bright red blood with 2 quarter size clots at the introitus.  No trickling of blood from introitus at present.         A: [redacted]w[redacted]d with PPROM and vaginal bleeding-possible previa     Increased vaginal bleeding at present     Preterm contractions  P: Consult obtained with Dr. Su Hilt.       Will proceed with C/S at the present time due to contractions, vaginal bleeding and FHR changes.       RBA C/S d/w pt, including bleeding, infection and potential damage to adjacent organs such as bowel and bladder. Pt agrees to proceed.   Amedeo Detweiler O., CNM 11/20/2011 11:02 PM

## 2011-11-20 NOTE — Progress Notes (Addendum)
Patient ID: Carla Haas, female   DOB: March 03, 1986, 26 y.o.   MRN: [id number] Carla Haas is a 26 y.o. [id number] at [redacted]w[redacted]d admitted for hyperemesis and found to have PPROM and vaginal bleeding with ?Previa vs Clot near internal os.  Caller by RN secondary to pt with irregular but frequent ctxs on the monitor but pt not feeling them and there is no increased bleeding and no change in d/c.  The bleeding is actually significantly improved with only scant bleeding noted.   Subjective: Pt has no complaints.  Denies ctxs, bleeding is scant.  She is resting comfortably.  No N/V.  Episode overnight where she passed mod sized clot but nothing more after that.  Objective: BP 116/55  Pulse 85  Temp(Src) 98.3 F (36.8 C) (Oral)  Resp 19  Ht 4\' 10"  (1.473 m)  Wt 113 lb (51.256 kg)  BMI 23.62 kg/m2  SpO2 99%  LMP 05/27/2011  Breastfeeding? Yes I/O last 3 completed shifts: In: 6387.7 [P.O.:2590; I.V.:3795.7; IV Piggyback:2] Out: 6325 [Urine:6325]    Physical Exam:  Gen: alert and resting Chest/Lungs: cta bilaterally  Heart/Pulse: RRR  Abdomen: soft, gravid, nontender Uterine fundus: soft, nontender Skin & Color: warm and dry  Neurological: AOx3, DTRs 1+ EXT: negative Homan's b/l, edema tr  FHT:  FHR: 130s-140s bpm, variability: moderate,  accelerations:  Present,  decelerations:  Absent UC:   Irregular and not picking up nearly as much as with ?Beacon monitor (RN thought it had artifact) SVE:   deferred  GBS positive  Labs: Lab Results  Component Value Date   WBC 14.6* 11/20/2011   HGB 8.3* 11/20/2011   HCT 25.8* 11/20/2011   MCV 85.7 11/20/2011   PLT 255 11/20/2011    Assessment and Plan: P3 at 26 2/7wks with PPROM and stable bleeding.  Repeat u/s 11/24/11 at [facility].  CBC with Hgb slightly decreased - ?concealed abruption.  Fetal status is overall reassuring with episodes of reactivity.  Will cont to monitor closely.  If heavy bleeding, signs of labor, fetal or maternal compromise,  I discussed delivery via c-section with the patient and she is agreeable.  Currently s/p BMZ.  Will taper Mg today to off.  I discussed this with the patient and she is agreeable and happy to have it discontinued.  Will recheck CBC daily.  Pt T&C for 2u PRBCs. Cont amoxicillin.  S/p amp and zithromax.  Purcell Nails 11/20/2011, 7:23 PM

## 2011-11-20 NOTE — Progress Notes (Signed)
I visited with pt while making rounds on the unit.  I had attempted to visit pt while she was on Women's unit last week (at nurse's referral) but she had been asleep when I came by.  She is having a difficult time with being here in the hospital--she has three other children at home and she was in the midst of moving into a new home when she first came in to the hospital.  In addition, 2 of her children have birthdays in the coming weeks and she is sad to not be home planning celebrations for them.  She is trying to find meaningful ways to pass the time here, but she is not feeling like herself which she attributes in part to all  the medication.   I offered compassionate listening and affirmation.  We will continue to follow her through her stay.  Please page as needed, [phone].    Carla Haas 11:02 AM   11/20/11 1000  Clinical Encounter Type  Visited With Patient  Visit Type Initial  Spiritual Encounters  Spiritual Needs Emotional  Stress Factors  Patient Stress Factors (Unexpected hospital admission)

## 2011-11-21 ENCOUNTER — Encounter (HOSPITAL_COMMUNITY): Payer: Self-pay | Admitting: *Deleted

## 2011-11-21 LAB — CBC
HCT: 19.8 % — ABNORMAL LOW (ref 36.0–46.0)
Hemoglobin: 6.3 g/dL — CL (ref 12.0–15.0)
Hemoglobin: 8.4 g/dL — ABNORMAL LOW (ref 12.0–15.0)
MCH: 27.2 pg (ref 26.0–34.0)
MCHC: 31.8 g/dL (ref 30.0–36.0)
MCHC: 32.4 g/dL (ref 30.0–36.0)
RBC: 3.03 MIL/uL — ABNORMAL LOW (ref 3.87–5.11)

## 2011-11-21 LAB — CULTURE, BETA STREP (GROUP B ONLY)

## 2011-11-21 MED ORDER — DIPHENHYDRAMINE HCL 50 MG/ML IJ SOLN: 25.0000 mg | INTRAMUSCULAR | Status: DC | PRN

## 2011-11-21 MED ORDER — FENTANYL CITRATE 0.05 MG/ML IJ SOLN
INTRAMUSCULAR | Status: AC
  Filled 2011-11-21: qty 2

## 2011-11-21 MED ORDER — DIPHENHYDRAMINE HCL 25 MG PO CAPS: 25.0000 mg | ORAL_CAPSULE | ORAL | Status: DC | PRN

## 2011-11-21 MED ORDER — KETOROLAC TROMETHAMINE 60 MG/2ML IM SOLN
INTRAMUSCULAR | Status: AC
  Filled 2011-11-21: qty 2

## 2011-11-21 MED ORDER — KETAMINE HCL 100 MG/ML IJ SOLN
INTRAMUSCULAR | Status: AC
  Filled 2011-11-21: qty 1

## 2011-11-21 MED ORDER — DIPHENHYDRAMINE HCL 25 MG PO CAPS: 25.0000 mg | ORAL_CAPSULE | Freq: Four times a day (QID) | ORAL | Status: DC | PRN

## 2011-11-21 MED ORDER — OXYTOCIN 20 UNITS IN LACTATED RINGERS INFUSION - SIMPLE: 125.0000 mL/h | INTRAVENOUS | Status: AC

## 2011-11-21 MED ORDER — IBUPROFEN 600 MG PO TABS: 600.0000 mg | ORAL_TABLET | Freq: Four times a day (QID) | ORAL | Status: DC | PRN

## 2011-11-21 MED ORDER — LACTATED RINGERS IV SOLN
Freq: Once | INTRAVENOUS | Status: AC
  Administered 2011-11-21: 1000 mL via INTRAVENOUS

## 2011-11-21 MED ORDER — PRENATAL MULTIVITAMIN CH
1.0000 | ORAL_TABLET | Freq: Every day | ORAL | Status: DC
  Administered 2011-11-22 – 2011-11-24 (×3): 1 via ORAL
  Filled 2011-11-21 (×4): qty 1

## 2011-11-21 MED ORDER — WITCH HAZEL-GLYCERIN EX PADS: 1.0000 "application " | MEDICATED_PAD | CUTANEOUS | Status: DC | PRN

## 2011-11-21 MED ORDER — MORPHINE SULFATE (PF) 0.5 MG/ML IJ SOLN
INTRAMUSCULAR | Status: DC | PRN
  Administered 2011-11-20: .1 mg via INTRATHECAL

## 2011-11-21 MED ORDER — OXYTOCIN 10 UNIT/ML IJ SOLN
INTRAMUSCULAR | Status: AC
  Filled 2011-11-21: qty 2

## 2011-11-21 MED ORDER — ONDANSETRON HCL 4 MG PO TABS: 4.0000 mg | ORAL_TABLET | ORAL | Status: DC | PRN

## 2011-11-21 MED ORDER — SODIUM CHLORIDE 0.9 % IJ SOLN: 3.0000 mL | INTRAMUSCULAR | Status: DC | PRN

## 2011-11-21 MED ORDER — DIBUCAINE 1 % RE OINT: 1.0000 "application " | TOPICAL_OINTMENT | RECTAL | Status: DC | PRN

## 2011-11-21 MED ORDER — NALBUPHINE SYRINGE 5 MG/0.5 ML
5.0000 mg | INJECTION | INTRAMUSCULAR | Status: DC | PRN
  Filled 2011-11-21: qty 1

## 2011-11-21 MED ORDER — METOCLOPRAMIDE HCL 5 MG/ML IJ SOLN: 10.0000 mg | Freq: Three times a day (TID) | INTRAMUSCULAR | Status: DC | PRN

## 2011-11-21 MED ORDER — MEPERIDINE HCL 25 MG/ML IJ SOLN: 6.2500 mg | INTRAMUSCULAR | Status: DC | PRN

## 2011-11-21 MED ORDER — SIMETHICONE 80 MG PO CHEW: 80.0000 mg | CHEWABLE_TABLET | ORAL | Status: DC | PRN

## 2011-11-21 MED ORDER — 0.9 % SODIUM CHLORIDE (POUR BTL) OPTIME
TOPICAL | Status: DC | PRN
  Administered 2011-11-21: 1000 mL

## 2011-11-21 MED ORDER — PHENYLEPHRINE 40 MCG/ML (10ML) SYRINGE FOR IV PUSH (FOR BLOOD PRESSURE SUPPORT)
PREFILLED_SYRINGE | INTRAVENOUS | Status: AC
  Filled 2011-11-21: qty 5

## 2011-11-21 MED ORDER — SIMETHICONE 80 MG PO CHEW
80.0000 mg | CHEWABLE_TABLET | Freq: Three times a day (TID) | ORAL | Status: DC
  Administered 2011-11-21 – 2011-11-24 (×11): 80 mg via ORAL

## 2011-11-21 MED ORDER — KETOROLAC TROMETHAMINE 30 MG/ML IJ SOLN: 30.0000 mg | Freq: Four times a day (QID) | INTRAMUSCULAR | Status: AC | PRN

## 2011-11-21 MED ORDER — SODIUM BICARBONATE 8.4 % IV SOLN
INTRAVENOUS | Status: DC | PRN
  Administered 2011-11-21: 30 mL via EPIDURAL

## 2011-11-21 MED ORDER — FENTANYL CITRATE 0.05 MG/ML IJ SOLN: 25.0000 ug | INTRAMUSCULAR | Status: DC | PRN

## 2011-11-21 MED ORDER — OXYCODONE-ACETAMINOPHEN 5-325 MG PO TABS
1.0000 | ORAL_TABLET | ORAL | Status: DC | PRN
  Administered 2011-11-21 (×2): 1 via ORAL
  Filled 2011-11-21 (×3): qty 1

## 2011-11-21 MED ORDER — LANOLIN HYDROUS EX OINT: 1.0000 "application " | TOPICAL_OINTMENT | CUTANEOUS | Status: DC | PRN

## 2011-11-21 MED ORDER — SENNOSIDES-DOCUSATE SODIUM 8.6-50 MG PO TABS
2.0000 | ORAL_TABLET | Freq: Every day | ORAL | Status: DC
  Administered 2011-11-21 – 2011-11-23 (×3): 2 via ORAL

## 2011-11-21 MED ORDER — IBUPROFEN 600 MG PO TABS
600.0000 mg | ORAL_TABLET | Freq: Four times a day (QID) | ORAL | Status: DC
  Administered 2011-11-21 – 2011-11-24 (×12): 600 mg via ORAL
  Filled 2011-11-21 (×14): qty 1

## 2011-11-21 MED ORDER — SODIUM CHLORIDE 0.9 % IV SOLN
1.0000 ug/kg/h | INTRAVENOUS | Status: DC | PRN
  Filled 2011-11-21: qty 2.5

## 2011-11-21 MED ORDER — SCOPOLAMINE 1 MG/3DAYS TD PT72
MEDICATED_PATCH | TRANSDERMAL | Status: AC
  Filled 2011-11-21: qty 1

## 2011-11-21 MED ORDER — ZOLPIDEM TARTRATE 5 MG PO TABS: 5.0000 mg | ORAL_TABLET | Freq: Every evening | ORAL | Status: DC | PRN

## 2011-11-21 MED ORDER — NALOXONE HCL 0.4 MG/ML IJ SOLN: 0.4000 mg | INTRAMUSCULAR | Status: DC | PRN

## 2011-11-21 MED ORDER — MORPHINE SULFATE (PF) 0.5 MG/ML IJ SOLN
INTRAMUSCULAR | Status: DC | PRN
  Administered 2011-11-21: 2 mg via INTRAVENOUS
  Administered 2011-11-21: 2.9 mg via EPIDURAL

## 2011-11-21 MED ORDER — KETOROLAC TROMETHAMINE 60 MG/2ML IM SOLN
60.0000 mg | Freq: Once | INTRAMUSCULAR | Status: AC | PRN
  Administered 2011-11-21: 60 mg via INTRAMUSCULAR

## 2011-11-21 MED ORDER — BUPIVACAINE IN DEXTROSE 0.75-8.25 % IT SOLN
INTRATHECAL | Status: DC | PRN
  Administered 2011-11-20: 1.3 mL via INTRATHECAL

## 2011-11-21 MED ORDER — ONDANSETRON HCL 4 MG/2ML IJ SOLN: 4.0000 mg | Freq: Three times a day (TID) | INTRAMUSCULAR | Status: DC | PRN

## 2011-11-21 MED ORDER — SCOPOLAMINE 1 MG/3DAYS TD PT72
1.0000 | MEDICATED_PATCH | Freq: Once | TRANSDERMAL | Status: AC
  Administered 2011-11-21: 1.5 mg via TRANSDERMAL

## 2011-11-21 MED ORDER — LACTATED RINGERS IV SOLN: INTRAVENOUS | Status: DC

## 2011-11-21 MED ORDER — TETANUS-DIPHTH-ACELL PERTUSSIS 5-2.5-18.5 LF-MCG/0.5 IM SUSP
0.5000 mL | Freq: Once | INTRAMUSCULAR | Status: AC
Start: 2011-11-22 — End: 2011-11-22
  Administered 2011-11-22: 0.5 mL via INTRAMUSCULAR
  Filled 2011-11-21: qty 0.5

## 2011-11-21 MED ORDER — CEFAZOLIN SODIUM 1-5 GM-% IV SOLN
INTRAVENOUS | Status: DC | PRN
  Administered 2011-11-20: 2 g via INTRAVENOUS

## 2011-11-21 MED ORDER — PHENYLEPHRINE HCL 10 MG/ML IJ SOLN
INTRAMUSCULAR | Status: DC | PRN
  Administered 2011-11-20 – 2011-11-21 (×2): 80 ug via INTRAVENOUS

## 2011-11-21 MED ORDER — MENTHOL 3 MG MT LOZG: 1.0000 | LOZENGE | OROMUCOSAL | Status: DC | PRN

## 2011-11-21 MED ORDER — ONDANSETRON HCL 4 MG/2ML IJ SOLN
INTRAMUSCULAR | Status: AC
  Filled 2011-11-21: qty 2

## 2011-11-21 MED ORDER — DIPHENHYDRAMINE HCL 50 MG/ML IJ SOLN: 12.5000 mg | INTRAMUSCULAR | Status: DC | PRN

## 2011-11-21 NOTE — Anesthesia Procedure Notes (Signed)
Spinal  Patient location during procedure: OR Start time: 11/20/2011 11:45 PM Staffing Performed by: anesthesiologist  Preanesthetic Checklist Completed: patient identified, site marked, surgical consent, pre-op evaluation, timeout performed, IV checked, risks and benefits discussed and monitors and equipment checked Spinal Block Patient position: sitting Prep: site prepped and draped and DuraPrep Patient monitoring: continuous pulse ox, blood pressure and heart rate Approach: midline Location: L3-4 Injection technique: single-shot Needle Needle type: Sprotte  Needle gauge: 24 G Needle length: 9 cm Assessment Sensory level: T4 Additional Notes Clear free flow CSF on first attempt.  No paresthesia.  Patient tolerated procedure well.  Jasmine December, MD

## 2011-11-21 NOTE — Anesthesia Postprocedure Evaluation (Signed)
Anesthesia Post Note  Patient: Carla Haas  Procedure(s) Performed: Procedure(s) (LRB): CESAREAN SECTION (N/A)  Anesthesia type: Spinal  Patient location: PACU  Post pain: Pain level controlled  Post assessment: Post-op Vital signs reviewed  Post vital signs: Reviewed  Level of consciousness: awake  Complications: No apparent anesthesia complications

## 2011-11-21 NOTE — Anesthesia Postprocedure Evaluation (Signed)
  Anesthesia Post-op Note  Patient: Carla Haas  Procedure(s) Performed: Procedure(s) (LRB): CESAREAN SECTION (N/A)  Patient Location: 304  Anesthesia Type: Spinal  Level of Consciousness: awake, alert  and oriented  Airway and Oxygen Therapy: Patient Spontanous Breathing  Post-op Pain: mild  Post-op Assessment: Post-op Vital signs reviewed, Patient's Cardiovascular Status Stable, No headache, No backache, No residual numbness and No residual motor weakness  Post-op Vital Signs: Reviewed and stable  Complications: No apparent anesthesia complications

## 2011-11-21 NOTE — Addendum Note (Signed)
Addendum  created 11/21/11 0749 by Graciela Husbands, CRNA   Modules edited:Notes Section

## 2011-11-21 NOTE — Op Note (Addendum)
Cesarean Section Procedure Note  Indications: 25wks with vaginal bleeding, contractions and probable abruption  Pre-operative Diagnosis: 25 Weeks, Premature Rupture Membranes, Bleeding, Probable Abruption   Post-operative Diagnosis: 25 Weeks, Premature Rupture Membranes, Bleeding, Complete Placental Abruption   Procedure: CLASSICAL CESAREAN SECTION  Surgeon: Osborn Coho, MD  Assistants: Elsie Ra, CNM  Anesthesia: Spinal  Anesthesiologist: Dana Allan, MD   Procedure Details  The patient was taken to the operating room secondary to vaginal bleeding, contractions and probable abruption with overall reassuring tracing but with increased baseline and occas variables after the risks, benefits, complications, treatment options, and expected outcomes were discussed with the patient.  The patient concurred with the proposed plan, giving informed consent which was signed and witnessed. The patient was taken to Operating Room 1, identified as Carla Haas and the procedure verified as C-Section Delivery. A Time Out was held and the above information confirmed.  After induction of anesthesia by obtaining a spinal, attempt to get fetal heart rate was unsuccessful and ultrasound at bedside the patient was prepped and draped in the usual sterile manner. A Pfannenstiel skin incision was made and carried down through the subcutaneous tissue to the underlying layer of fascia.  The fascia was incised bilaterally and extended transversely bilaterally with the scalpel. The muscle was separated bluntly in the midline and the peritoneum entered bluntly and extended manually.  Bowel was protruding and laparotomy sponge was placed.  A low transverse uterine incision was initially attempted but with the thickness and the continued marked contractions of the uterus decision quickly made to proceed with classical incision.  The initial low transverse attempt did not go through and through to cavity.  The infant  was delivered in bulk with the placenta which was completely wrapped around the baby.  The infant wrapped in placenta was handed to the awaiting neonatologist and respiratory therapist. The uterus was cleared of all clots and debris. The uterine incision was closed with running interlocking sutures of 0 Vicryl in multiple layers with subsequent layers imbricated.  A total of five layers were closed with 0 vicryl with the last using 3-0 vicryl on the serosa.  The low transverse incision was repaired with imbricating stitch of 0 vicryl.  The uterus had been exteriorized for the closure.  Bilateral tubes and ovaries appeared to be within normal limits.  Small amount of oozing was noted from uterine incision and it was returned to the intraabdominal cavity.  Interceed was applied after performing copious irrigation.  The peritoneum was repaired with 2-0 chromic via a running suture.  The fascia was reapproximated with a running suture of 0 Vicryl. The subcutaneous tissue was reapproximated with 3 interrupted sutures of 2-0 plain.  The skin was reapproximated with a subcuticular suture of 3-0 monocryl.  Steristrips were applied.  Instrument, sponge, and needle counts were correct prior to abdominal closure and at the conclusion of the case.  The patient was awaiting transfer to the recovery room in good condition.  Findings: Live female infant with Apgars still pending per Neonatology.  Normal appearing bilateral ovaries and fallopian tubes were noted.  Estimated Blood Loss:  1200 ml         Drains: foley to gravity with clear urine totaling 450 cc         Total IV Fluids: 1100 ml         Specimens to Pathology: Placenta         Complications:  None; patient tolerated the procedure well.  Disposition: PACU - hemodynamically stable.         Condition: stable  Attending Attestation: I performed the procedure.

## 2011-11-21 NOTE — Progress Notes (Signed)
Patient ID: Carla Haas, female   DOB: March 16, 1986, 26 y.o.   MRN: [id number] Pt sleeping.  The nurse states she walked earlier wihtout difficulty.   Would do orhtostatics in the morning

## 2011-11-21 NOTE — Transfer of Care (Signed)
Immediate Anesthesia Transfer of Care Note  Patient: Carla Haas  Procedure(s) Performed: Procedure(s) (LRB): CESAREAN SECTION (N/A)  Patient Location: PACU  Anesthesia Type: Spinal  Level of Consciousness: awake, alert  and oriented  Airway & Oxygen Therapy: Patient Spontanous Breathing  Post-op Assessment: Report given to PACU RN  Post vital signs: Reviewed and stable  Complications: No apparent anesthesia complications

## 2011-11-21 NOTE — Progress Notes (Signed)
Dr Su Hilt gave order to prepare pt for c/s delivery.

## 2011-11-22 ENCOUNTER — Encounter: Payer: Medicaid Other | Admitting: Obstetrics and Gynecology

## 2011-11-22 ENCOUNTER — Other Ambulatory Visit: Payer: Medicaid Other

## 2011-11-22 NOTE — Progress Notes (Signed)
Subjective: Postpartum Day 2: Cesarean Delivery Patient reports tolerating PO, + flatus and no problems voiding, pain meds are working well.    Objective:  Results for orders placed during the hospital encounter of 11/14/11 (from the past 48 hour(s))  PREPARE RBC (CROSSMATCH)     Status: Normal   Collection Time   11/20/11  9:00 AM      Component Value Range Comment   Order Confirmation ORDER PROCESSED BY BLOOD BANK     TYPE AND SCREEN     Status: Normal (Preliminary result)   Collection Time   11/20/11  9:15 AM      Component Value Range Comment   ABO/RH(D) A POS      Antibody Screen NEG      Sample Expiration 11/23/2011      Unit Number [id number]      Blood Component Type RED CELLS,LR      Unit division 00      Status of Unit ALLOCATED      Transfusion Status OK TO TRANSFUSE      Crossmatch Result Compatible      Unit Number [id number]      Blood Component Type RED CELLS,LR      Unit division 00      Status of Unit ALLOCATED      Transfusion Status OK TO TRANSFUSE      Crossmatch Result Compatible     CBC     Status: Abnormal   Collection Time   11/21/11 12:22 AM      Component Value Range Comment   WBC 22.0 (*) 4.0 - 10.5 (K/uL)    RBC 3.03 (*) 3.87 - 5.11 (MIL/uL)    Hemoglobin 8.4 (*) 12.0 - 15.0 (g/dL)    HCT 08.6 (*) 57.8 - 46.0 (%)    MCV 85.5  78.0 - 100.0 (fL)    MCH 27.7  26.0 - 34.0 (pg)    MCHC 32.4  30.0 - 36.0 (g/dL)    RDW 46.9  62.9 - 52.8 (%)    Platelets 258  150 - 400 (K/uL)   CBC     Status: Abnormal   Collection Time   11/21/11  5:10 AM      Component Value Range Comment   WBC 18.2 (*) 4.0 - 10.5 (K/uL)    RBC 2.32 (*) 3.87 - 5.11 (MIL/uL)    Hemoglobin 6.3 (*) 12.0 - 15.0 (g/dL)    HCT 41.3 (*) 24.4 - 46.0 (%)    MCV 85.3  78.0 - 100.0 (fL)    MCH 27.2  26.0 - 34.0 (pg)    MCHC 31.8  30.0 - 36.0 (g/dL)    RDW 01.0  27.2 - 53.6 (%)    Platelets 190  150 - 400 (K/uL)      Vital signs in last 24 hours  BP 92/59  Pulse 102  Temp(Src) 98.1 F  (36.7 C) (Oral)  Resp 16  Ht 4\' 10"  (1.473 m)  Wt 113 lb (51.256 kg)  BMI 23.62 kg/m2  SpO2 99%  LMP 05/27/2011  Breastfeeding? Yes Orthostatic bps WNL S: comfortable, little bleeding, slept some breast     Feeding well, not dizzy up except when in the NICU at times feeling hot then Physical Exam:  General: alert, cooperative and no distress Lochia: appropriate Uterine Fundus: firm Incision: dressing with scant dry serous blood will have pt remove in shower today DVT Evaluation: Negative Homan's sign. Calf/Ankle edema is absent Lungs clear bilaterally AP RRR Bowel  sounds active abd tympanic nt,  A normal involution     Lactating     PO day 2     Normal involution     anemia Pdiscussed anemia risks and benefits of blood transfusion and declines, encouraged ambulation with assistance at first, continue care Lavera Guise, CNM

## 2011-11-23 NOTE — Progress Notes (Signed)
UR Chart review completed.  

## 2011-11-23 NOTE — Progress Notes (Signed)
Subjective: Postpartum Day 3: Cesarean Delivery at 25 weeks due to complete abruption Patient reports infant is "stable" in NICU.  Patient denies any syncope or dizziness.  Able to ambulate without any issues.  Minimal pain--only taking Ibuprophen.  Plans Mirena for contraception.  Objective: Vital signs in last 24 hours: Temp:  [98.2 F (36.8 C)-98.7 F (37.1 C)] 98.5 F (36.9 C) (05/30 1816) Pulse Rate:  [80-107] 92  (05/30 1816) Resp:  [16-18] 18  (05/30 1816) BP: (87-108)/(52-66) 104/66 mmHg (05/30 1816) SpO2:  [97 %-100 %] 100 % (05/30 1400)  Physical Exam:  General: alert Lochia: appropriate Uterine Fundus: firm Incision: healing well DVT Evaluation: No evidence of DVT seen on physical exam. Negative Homan's sign.   Basename 11/21/11 0510 11/21/11 0022  HGB 6.3* 8.4*  HCT 19.8* 25.9*    Assessment/Plan: Status post Cesarean section for complete abruption at 25 weeks Anemia--stable hemodynamically NICU infant  Plan: Reviewed Hgb results--R&B of transfusion reviewed.  Patient still declines transfusion at present.  Discussed with patient that after discharge, if she feels she is having more difficulty with energy level, etc, she always has the option of choosing a transfusion. Anticipate d/c tomorrow.  Nigel Bridgeman 11/23/2011, 8:06 PM

## 2011-11-24 LAB — TYPE AND SCREEN
Antibody Screen: NEGATIVE
Unit division: 0

## 2011-11-24 MED ORDER — IBUPROFEN 200 MG PO TABS: 600.0000 mg | ORAL_TABLET | Freq: Four times a day (QID) | ORAL | Status: AC | PRN

## 2011-11-24 MED ORDER — OXYCODONE-ACETAMINOPHEN 5-325 MG PO TABS: 1.0000 | ORAL_TABLET | ORAL | Status: AC | PRN

## 2011-11-24 NOTE — Discharge Summary (Signed)
Obstetric Discharge Summary Reason for Admission: Hyperemesis, no prenatal care Prenatal Procedures: IV hydration, antiemetics, [place], antibiotics, magnesium sulfate for neuroprophylaxix, betamethasone Intrapartum Procedures: cesarean: classical due to prematurity, complete abruption Postpartum Procedures: none Complications-Operative and Postpartum: Anemia, NICU infant Hemoglobin  Date Value Range Status  11/21/2011 6.3* 12.0-15.0 (g/dL) Final     DELTA CHECK NOTED     REPEATED TO VERIFY     CRITICAL RESULT CALLED TO, READ BACK BY AND VERIFIED WITH:     GRAM, P. @ 0604 ON 11/21/11 BY BOVELL, A.      HCT  Date Value Range Status  11/21/2011 19.8* 36.0-46.0 (%) Final  Hgb 8.3 on day 1 post-op, 6.3 on day 3, without hemodynamic instability.   Hospital Course: Admitted 11/14/11 with no prenatal care (but had scheduled upcoming appointment with [facility]) and hyperemesis.   She was hospitalized on the 3rd floor for IV hydration, antiemetics, and observation until tolerating po intake.  However, during the evening of 5/24, she had SROM and vaginal bleeding.  She was transferred to L&D for further evaluation and care.  US showed clot vs previa os, and MFM was consulted--in light of patient's desire for all intervention, magnesium sulfate for neuroprophylaxis and betamethasone were given, with IV antibiotics for PPROM.  On the evening of 5/27, she developed more bleeding and cramping, and Dr. Su Hilt made the decision to proceed with C/S.  A complete abruption was noted at delivery, and the infant was taken to NICU in critical condition.  Patient tolerated the procedure without instability, and was taken to recovery, then to 3rd floor.  She remained stable throughout her stay, and continued to decline transfusion.  She frequently visited the NICU and was pumping for breastmilk. She planned Mirena for contraception.  Mom's physical exam was WNL, and she was discharged home in stable condition.  She received  adequate benefit from po pain medications and was sent home with Ibuprophen and Percocet.  Infant remained in critical, but stable condition, in NICU.      Physical Exam:  General: alert Lochia: appropriate Uterine Fundus: firm Incision: healing well DVT Evaluation: No evidence of DVT seen on physical exam. Negative Homan's sign.  Discharge Diagnoses: PPROM, complete abruption, primary classical C/S, anemia, prematurity,  NICU infant  Discharge Information: Date: 11/24/2011 Activity: Per [facility] handout Diet: routine Medications: Ibuprofen, Iron and Percocet Condition: stable Instructions: refer to practice specific booklet Discharge to: home Contraception:  Mirena Follow-up Information    Follow up with Dr. Su Hilt in 6 weeks.         Newborn Data: Live born female  Birth Weight: 1 lb 2.3 oz (520 g) APGAR: 3, 5  Infant remains critical, but stable, in NICU  Brinleigh Tew 11/24/2011, 11:11 AM

## 2011-11-24 NOTE — Progress Notes (Signed)
Pt d/c to home   Teaching complete ambulated out

## 2011-11-24 NOTE — Discharge Instructions (Signed)
Early Elective Birth Early elective birth refers to making a choice to have a baby before the time the baby is due. The length of a pregnancy is 9 months, or 40 weeks starting from the beginning of a woman's last menstrual period (LMP). Most women naturally go into labor around 40 weeks. A "full-term" pregnancy is considered between 61 and [redacted] weeks gestation age. Currently, early elective births can take place sometime after 39 weeks. Most caregivers practice within the guidelines of delivering a baby no later than 42 weeks and no earlier than 39 weeks. There are always exceptions to this time interval, and the risks involved to the mother and baby need to be considered in those cases.  Induction of labor refers to the use of medicines to bring about contractions. "Labor" is when the cervix starts to widen (dilate). "Active labor" is when there are contractions and the cervix has dilated to at least 4 cm. Often times, the earlier a mother is in her pregnancy, the longer it takes to get induced. When the cervix is ready (dilated and soft), an induction may take less than a day. However, when a cervix is far away from being ready (long, closed, and firm), it may take days in a hospital for labor to start.  Currently, 39 weeks is considered the earliest a caregivershould start the induction process. This is because the longer the baby stays inside the uterus, the lower the risks are to both the baby and mother. However, sometimes there are very good reasons for a pregnancy to be induced before [redacted] weeks gestation. These exceptions are specific to each individual pregnancy and need to be considered on a case-by-case basis. A good reason to induce one pregnancy may not be good a good reason for another pregnancy.  REASONS FOR ELECTIVE BIRTH It may be safer to induce labor before 39 weeks if:   A woman is carrying more than 1 baby. Current standards are to deliver twin pregnancies at 38 weeks.   A woman is  having complications, such as:   High blood pressure caused by pregnancy (preeclampsia).   Bleeding.   Infection.   There are conditions affecting the baby's health, such as:   Intrauterine growth restriction (IUGR), where the baby is not growing well.   Having abnormal fetal heart rate patterns on the monitor (nonreassuring tracing).   Having a lack of fluid that surrounds the baby (oligohydramnios).   Having placental issues.   Fluid that surrounds the baby (amniotic fluid) is leaking.  There are many other safety reasons that a pregnancy may need to be induced early. REASONS AGAINST ELECTIVE BIRTH Sometimes early elective birth is not the best choice. It may not be a good idea if:   An early birth is just more convenient.   You want the baby to be born on a certain date, like a holiday.   You are more likely to need a cesarean delivery before 39 weeks.  A cesarean delivery can lead to other problems. Problems include infection, bleeding, and not having enough iron in your blood (anemia), which can cause weakness.  Babies born early (2 to 37 weeks premature):  May need special care at the hospital or in a special care nursery.   Are at a greater risk for:   Brain damage.   Feeding problems.   Breathing problems.   Slow physical and mental development.   May need special care in a neonatal intensive care unit (NICU), but this is  rare. The length of the baby's stay in the hospital will depend on how quickly he or she progresses to a safe level of care.   Are at a greater risk for:   Infection.   Bleeding inside the brain.   Dying during their first year of life.  REDUCING EARLY ELECTIVE BIRTHS Carrying a baby longer than 42 weeks is not good for the baby or the mother. A full-term pregnancy is best for baby and mother. Anything earlier can be risky for you and your baby. Remember:  An early elective birth may lead to a cesarean delivery. This can lead to other  problems for the mother and baby.   An early elective birth can result in developmental problems for your child.   A baby's brain continues to develop while in the uterus.   A baby's body continues to develop. The baby will be better able to breathe and eat when he or she is born near the due date.   A baby who stays in the uterus longer responds better. The baby will also bond better with you.  Document Released: 02/22/2011 Document Revised: 06/01/2011 Document Reviewed: 02/22/2011 [facility] Patient Information 2012 [place], [place].  Cesarean Delivery Care After Refer to this sheet in the next few weeks. These instructions provide you with information on caring for yourself after your procedure. Your caregiver may also give you more specific instructions. Your treatment has been planned according to current medical practices, but problems sometimes occur. Call your caregiver if you have any problems or questions after your procedure. HOME CARE INSTRUCTIONS Healing will take time. You will have discomfort, tenderness, swelling, and bruising at the surgery site for a couple of weeks. This is normal and will get better as time goes on. Activity  Rest as much as possible the first 2 weeks.   When possible, have someone help you with your household activities and your baby for 2 to 3 weeks.   Limit your housework and social activity. Increase your activity gradually as your strength returns.   Do not climb stairs more than 2 to 3 times a day.   Do not lift anything heavier than your baby.   Follow your caregiver's instructions about driving a car.   Exercise only as directed by your caregiver.  Nutrition  You may return to your usual diet. Eat a well-balanced diet.   Drink enough fluid to keep your urine clear or pale yellow.   Keep taking your prenatal or multivitamins.   Do not drink alcohol until your caregiver says it is okay.  Elimination You should return to your usual  bowel function. If you develop constipation, ask your caregiver about taking a mild laxative that will help you go to the bathroom. Bran foods and fluids help with constipation. Gradually add fruit, vegetables, and bran to your diet.  Hygiene  You may shower, wash your hair, and take tub baths unless your caregiver tells you otherwise.   Continue perineal care until your vaginal bleeding and discharge stops.   Do not douche or use tampons until your caregiver says it is okay.  Fever If you feel feverish or have shaking chills, take your temperature. The fever may indicate infection. Infections can be treated with antibiotic medicine. Pain Control and Medicine  Only take over-the-counter or prescription medicine as directed by your caregiver. Do not take aspirin. It can cause bleeding.   Do not drive when taking pain medicine.   Talk to your caregiver about restarting  or adjusting your normal medicines.  Incision Care  Clean your cut (incision) gently with soap and water, then pat dry.   If your caregiver says it is okay, leave the incision without a bandage (dressing) unless it is draining fluid or irritated.   If you have small adhesive strips across the incision and they do not fall off within 7 days, carefully peel them off.   Check the incision daily for increased redness, drainage, swelling, or separation of skin.   Hug a pillow when coughing or sneezing. This helps to relieve pain.  Vaginal Care You may have a vaginal discharge or bleeding for up to 6 weeks. If the vaginal discharge becomes bright red, bad smelling, heavy in amount, has blood clots, or if you have burning or frequent urination, call your caregiver.If your bleeding slows down and then gets heavier, your body is telling you to slow down and relax more. Sexual Intercourse  Check with your caregiver before resuming sexual activity. Often, after 4 to 6 weeks, if you feel good and are well rested, sexual activity may  be resumed. Avoid positions that strain the incision site.   You can become pregnant before you have a period. If you decide to have sexual intercourse, use birth control if you do not want to become pregnant right away.  Health Practices  Keep all your postpartum appointments as recommended by your caregiver. Generally, your caregiver will want to see you in 2 to 3 weeks.   Continue with your yearly pelvic exams.   Continue monthly self-breast exams and yearly physical exams with a Pap test.  Breast Care  If you are not breastfeeding and your breasts become tender, hard, or leak milk, you may wear a tight-fitting bra and apply ice to your breasts.   If you are breastfeeding, wear a good support bra.   Call your caregiver if you have breast pain, flu-like symptoms, fever, or hardness and reddening of your breasts.  Postpartum Blues You may have a period of low spirits or "blues" after your baby is born. Discuss your feelings with your partner, family, and friends. This may be caused by the changing hormone levels in your body. You may want to contact your caregiver if this is worrisome. Miscellaneous  Limit wearing support panties or control-top hose.   If you breastfeed, you may not have a period for several months or longer. This is normal for the nursing mother. If you do not menstruate within 6 weeks after you stop breastfeeding, see your caregiver.   If you are not breastfeeding, you can expect to menstruate within 6 to 10 weeks after birth. If you have not started by the 11th week, check with your caregiver.  SEEK MEDICAL CARE IF:   There is swelling, redness, or increasing pain in the wound area.   You have pus coming from the wound.   You notice a bad smell from the wound or surgical dressing.   You have pain, redness, and swelling from the intravenous (IV) site.   Your wound breaks open (the edges are not staying together).   You feel dizzy or feel like fainting.    You develop pain or bleeding when you urinate.   You develop diarrhea.   You develop nausea and vomiting.   You develop abnormal vaginal discharge.   You develop a rash.   You have any type of abnormal reaction or develop an allergy to your medicine.   Your pain is not relieved by  your medicine or becomes worse.   Your temperature is 101 F (38.3 C), or is 100.4 F (38 C) taken 2 times in a 4 hour period.  SEEK IMMEDIATE MEDICAL CARE:  You develop a temperature of 102 F (38.9 C) or higher.   You develop abdominal pain.   You develop chest pain.   You develop shortness of breath.   You faint.   You develop pain, swelling, or redness of your leg.   You develop heavy vaginal bleeding with or without blood clots.  Document Released: 03/04/2002 Document Revised: 06/01/2011 Document Reviewed: 09/07/2010 [facility] Patient Information 2012 [place], [place].

## 2011-11-24 NOTE — Progress Notes (Signed)
This was a follow-up visit with Carla Haas.  I first saw her when she was on antenatal.   Carla Haas is dealing with all of the emotions that accompany an early delivery after gearing herself up to be on antenatal for a lengthy period of time.  She is trying to stay strong for her baby and also be there for her other 3 children, (age 26, 4 and 1--as of tomorrow).  Since she has had the experience of 3 healthy children, she feels a great loss at not getting to hold or be with her baby in the way that she was able to with the others. We will continue to follow-up with her in the NICU.  Please page as needed or as pt requests.  [phone].  Chaplain Carla Haas 10:08 AM   11/24/11 1000  Clinical Encounter Type  Visited With Patient  Visit Type Follow-up  Spiritual Encounters  Spiritual Needs Prayer  Stress Factors  Patient Stress Factors (Baby in NICU.)

## 2011-11-24 NOTE — Progress Notes (Addendum)
Subjective: Postpartum Day 4: Cesarean Delivery Patient reports tolerating PO and no problems voiding.  Ambulating well.  Objective: Vital signs in last 24 hours: Temp:  [98.3 F (36.8 C)-98.5 F (36.9 C)] 98.3 F (36.8 C) (05/31 0532) Pulse Rate:  [90-102] 96  (05/31 0532) Resp:  [16-18] 18  (05/31 0532) BP: (104-115)/(63-69) 107/65 mmHg (05/31 0532) SpO2:  [99 %-100 %] 99 % (05/31 0532)  Physical Exam:  General: alert, cooperative and no distress Lochia: appropriate Uterine Fundus: firm Incision: healing well DVT Evaluation: No evidence of DVT seen on physical exam.  No results found for this basename: HGB:2,HCT:2 in the last 72 hours  Assessment/Plan: Status post Cesarean section. Doing well postoperatively.  Anemia. Not orthostatic. Discharge home with standard precautions and return to clinic in 4-6 weeks. Mirena for contraception. Pumping her breast.  Janine Limbo 11/24/2011, 10:06 AM

## 2011-11-29 ENCOUNTER — Encounter: Payer: Medicaid Other | Admitting: Obstetrics and Gynecology

## 2011-12-17 ENCOUNTER — Emergency Department (HOSPITAL_COMMUNITY): Payer: Medicaid Other

## 2011-12-17 ENCOUNTER — Emergency Department (HOSPITAL_COMMUNITY)
Admission: EM | Admit: 2011-12-17 | Discharge: 2011-12-17 | Disposition: A | Discharge status: Other Acute Inpatient Hospital | Payer: Medicaid Other | Attending: Emergency Medicine | Admitting: Emergency Medicine

## 2011-12-17 ENCOUNTER — Encounter (HOSPITAL_COMMUNITY): Payer: Self-pay | Admitting: Emergency Medicine

## 2011-12-17 ENCOUNTER — Other Ambulatory Visit: Payer: Self-pay | Admitting: Oncology

## 2011-12-17 DIAGNOSIS — D649 Anemia, unspecified: Secondary | ICD-10-CM

## 2011-12-17 DIAGNOSIS — J9859 Other diseases of mediastinum, not elsewhere classified: Secondary | ICD-10-CM

## 2011-12-17 DIAGNOSIS — R599 Enlarged lymph nodes, unspecified: Secondary | ICD-10-CM

## 2011-12-17 DIAGNOSIS — R222 Localized swelling, mass and lump, trunk: Secondary | ICD-10-CM | POA: Insufficient documentation

## 2011-12-17 DIAGNOSIS — R0602 Shortness of breath: Secondary | ICD-10-CM | POA: Insufficient documentation

## 2011-12-17 DIAGNOSIS — R05 Cough: Secondary | ICD-10-CM | POA: Insufficient documentation

## 2011-12-17 DIAGNOSIS — R918 Other nonspecific abnormal finding of lung field: Secondary | ICD-10-CM | POA: Insufficient documentation

## 2011-12-17 DIAGNOSIS — R059 Cough, unspecified: Secondary | ICD-10-CM | POA: Insufficient documentation

## 2011-12-17 DIAGNOSIS — J90 Pleural effusion, not elsewhere classified: Secondary | ICD-10-CM | POA: Insufficient documentation

## 2011-12-17 LAB — DIFFERENTIAL
Basophils Relative: 0 % (ref 0–1)
Eosinophils Absolute: 0.2 10*3/uL (ref 0.0–0.7)
Lymphs Abs: 13.4 10*3/uL — ABNORMAL HIGH (ref 0.7–4.0)
Monocytes Absolute: 1.2 10*3/uL — ABNORMAL HIGH (ref 0.1–1.0)
Neutro Abs: 3 10*3/uL (ref 1.7–7.7)

## 2011-12-17 LAB — COMPREHENSIVE METABOLIC PANEL
Alkaline Phosphatase: 59 U/L (ref 39–117)
BUN: 5 mg/dL — ABNORMAL LOW (ref 6–23)
Calcium: 8.6 mg/dL (ref 8.4–10.5)
Creatinine, Ser: 0.77 mg/dL (ref 0.50–1.10)
GFR calc Af Amer: >90 mL/min (ref >90)
Glucose, Bld: 88 mg/dL (ref 70–99)
Potassium: 3.6 mEq/L (ref 3.5–5.1)
Total Protein: 7 g/dL (ref 6.0–8.3)

## 2011-12-17 LAB — CBC
HCT: 25.6 % — ABNORMAL LOW (ref 36.0–46.0)
Hemoglobin: 7.9 g/dL — ABNORMAL LOW (ref 12.0–15.0)
MCH: 24.3 pg — ABNORMAL LOW (ref 26.0–34.0)
MCHC: 30.9 g/dL (ref 30.0–36.0)
MCV: 78.8 fL (ref 78.0–100.0)
RDW: 17 % — ABNORMAL HIGH (ref 11.5–15.5)

## 2011-12-17 LAB — LACTATE DEHYDROGENASE: LDH: 267 U/L — ABNORMAL HIGH (ref 94–250)

## 2011-12-17 LAB — URIC ACID: Uric Acid, Serum: 6.6 mg/dL (ref 2.4–7.0)

## 2011-12-17 MED ORDER — IOHEXOL 300 MG/ML  SOLN
80.0000 mL | Freq: Once | INTRAMUSCULAR | Status: AC | PRN
  Administered 2011-12-17: 80 mL via INTRAVENOUS

## 2011-12-17 MED ORDER — ALBUTEROL SULFATE HFA 108 (90 BASE) MCG/ACT IN AERS
2.0000 | INHALATION_SPRAY | RESPIRATORY_TRACT | Status: DC | PRN
  Administered 2011-12-17: 2 via RESPIRATORY_TRACT
  Filled 2011-12-17: qty 6.7

## 2011-12-17 NOTE — ED Provider Notes (Signed)
History  This chart was scribed for Cyndra Numbers, MD by Bennett Scrape. This patient was seen in room TR05C/TR05C and the patient's care was started at 11:35AM.  CSN: [id number]  Arrival date & time 12/17/11  1109   First MD Initiated Contact with Patient 12/17/11 1135      Chief Complaint  Patient presents with  . Shortness of Breath    The history is provided by the patient. No language interpreter was used.    Carla Haas is a 26 y.o. female who presents to the Emergency Department complaining of 2 weeks of SOB with associated non-productive cough and sore throat after having a c-section. The symptoms are worse in the heat and occasionally with exertion. She denies taking OTC medications at home to improve symptoms. She reports having a h/o bronchitis in which she was treated with albuterol inhalers but denies having a h/o asthma or COPD. She denies having sick contacts with similar symptoms. She has not seen a MD for the symptoms prior to today. She also states that she noticed a lump in the midsternal area on her chest yesterday. She denies having a h/o boils or abscesses. She denies chest pain, fevers, chills, nasal congestion and emesis as associated symptoms. She has a h/o anxiety and anemia after the c-section but denies any blood transfusions. She denies smoking and alcohol use.  Patient has been treated with albuterol in the past for bronchitis it is uncertain if her symptoms today are similar she cannot remember that.  Pt reports that she underwent an emergency c-section because her water broke early and she was put on bedrest. Patient denies any recent weight loss aside from delivery of her child who expired at the time of delivery.  Past Medical History  Diagnosis Date  . Urinary tract infection   . Anxiety 2007    SHORT COURSE OF MEDS  . Anemia     TAKING FE  . Infection     UIT X 1  . Infection     TRICH X 1  . Infection     YEAST X1    Past Surgical History   Procedure Date  . Cholecystectomy     age 14  . Choley   . Cesarean section 11/20/2011    Procedure: CESAREAN SECTION;  Surgeon: Purcell Nails, MD;  Location: [facility];  Service: Gynecology;  Laterality: N/A;  Primary Cesarean Section Delivery Baby Girl @ 2350    Family History  Problem Relation Age of Onset  . Anesthesia problems Neg Hx   . Alcohol abuse Mother   . Lupus Brother     History  Substance Use Topics  . Smoking status: Never Smoker   . Smokeless tobacco: Never Used  . Alcohol Use: No    OB History    Grav Para Term Preterm Abortions TAB SAB Ect Mult Living   6 4 3 1 2  0 2 0 0 3      Review of Systems  Constitutional: Negative for fever and chills.  HENT: Positive for sore throat. Negative for congestion.   Respiratory: Positive for cough and shortness of breath. Negative for chest tightness.   Gastrointestinal: Negative for nausea and vomiting.  All other systems reviewed and are negative.    Allergies  Review of patient's allergies indicates no known allergies.  Home Medications  No current outpatient prescriptions on file.  Triage Vitals: BP 132/77  Pulse 72  Temp 98.1 F (36.7 C) (Oral)  Resp 15  Ht 4\' 11"  (1.499 m)  Wt 102 lb (46.267 kg)  BMI 20.60 kg/m2  SpO2 100%  Breastfeeding? Yes  Physical Exam  Nursing note and vitals reviewed.  GEN: Well-developed, well-nourished female in no distress HEENT: Atraumatic, normocephalic. Oropharynx clear without erythema EYES: PERRLA BL, no scleral icterus. NECK: Trachea midline, no meningismus CHEST: 1 cm nodular feeling area just right of the sternum in the T4-T5 interspace, no overlaying skin changes CV: regular rate and rhythm. No murmurs, rubs, or gallops PULM: No respiratory distress.  No crackles, wheezes, or rales. Lungs sounds are diminished throughout but equal.  Neuro: cranial nerves grossly 2-12 intact, no abnormalities of strength or sensation, A and O x 3 MSK: Patient moves all 4  extremities symmetrically, no deformity, edema, or injury noted Skin: No rashes petechiae, purpura, or jaundice Psych: Patient has somewhat flat affect likely consistent with current state of grief.  ED Course  Procedures (including critical care time)  DIAGNOSTIC STUDIES: Oxygen Saturation is 100% on room air, normal by my interpretation.    COORDINATION OF CARE: 11:47PM-Discussed treatment plan of blood test, chest x-ray and albuterol treatment with pt and pt agreed to plan.  12:27PM-Informed pt of chest x-ray results. Discussed further treatment plan which includes CT scan of chest and further blood testing. Pt reports that the albuterol inhaler improved her breathing.  Labs Reviewed  CBC - Abnormal; Notable for the following:    WBC 17.8 (*)     RBC 3.25 (*)     Hemoglobin 7.9 (*)     HCT 25.6 (*)     MCH 24.3 (*)     RDW 17.0 (*)     All other components within normal limits  DIFFERENTIAL - Abnormal; Notable for the following:    Neutrophils Relative 17 (*)     Lymphocytes Relative 75 (*)     Lymphs Abs 13.4 (*)     Monocytes Absolute 1.2 (*)     All other components within normal limits  COMPREHENSIVE METABOLIC PANEL - Abnormal; Notable for the following:    BUN 5 (*)     Albumin 3.4 (*)     All other components within normal limits   Dg Chest 2 View  12/17/2011  *RADIOLOGY REPORT*  Clinical Data: Shortness of breath.  CHEST - 2 VIEW  Comparison: 05/12/2008.  Findings: There is a large right-sided pleural effusion with overlying atelectasis or infiltrate.  The mediastinal contours are prominent and findings suspicious for mediastinal adenopathy or mass.  The left lung is clear.  The heart size is normal.  The aorta is normal in caliber.  The bony thorax is intact.  Bilateral cervical ribs are noted.  IMPRESSION:  Abnormal chest x-ray with a large right pleural effusion and overlying atelectasis or infiltrate along with probable mediastinal mass or adenopathy.  Recommend chest  CT with contrast for further evaluation.  Original Report Authenticated By: P. Loralie Champagne, M.D.   Ct Chest W Contrast  12/17/2011  *RADIOLOGY REPORT*  Clinical Data: Shortness of breath and abnormal chest x-ray.  CT CHEST WITH CONTRAST  Technique:  Multidetector CT imaging of the chest was performed following the standard protocol during bolus administration of intravenous contrast.  Contrast: 80mL OMNIPAQUE IOHEXOL 300 MG/ML  SOLN  Comparison: Chest x-ray, same date.  Findings: Examination of the chest wall demonstrates no breast masses.  There are numerous small bilateral axillary lymph nodes along with a slightly enlarged bilateral supraclavicular nodes. The thyroid gland appears normal.  The bony  thorax is intact.  No destructive bone lesions or spinal canal compromise.  There is a large anterior mediastinal mass measuring approximately 9 x 8 x 7 cm.  There are scattered mediastinal lymph nodes.  The aorta is normal in caliber.  The major branch vessels are normal. The mass does surround and compresses the superior vena cava.  The esophagus is grossly normal.  There is enhancing pleural soft tissue density on the right side suspicious for pleural extension of tumor and this likely accounts for the large right pleural effusion.  This also a small left pleural effusion and a small focus of enhancing soft tissue on image number 22 just posterior to the aorta.  The upper abdomen is unremarkable.  A small hemangioma is noted in the liver.  Surgical clips from cholecystectomy are noted along with mild common bile duct dilatation.  IMPRESSION:  1.  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 2.  Fairly significant compression of the SVC.  Original Report Authenticated By: P. Loralie Champagne, M.D.     1. Mediastinal mass   2. Pleural effusion   3. Shortness of  breath   4. Anemia       MDM  Patient was evaluated by myself. She was him dynamically stable. Patient her reported shortness of breath and cough but denied any fevers or sick contacts. She did report anemia following her C-section. Initially patient was provided with albuterol inhaler and had a chest x-ray and i-STAT Chem-8 ordered to evaluate for anemia. Chest x-ray was performed and was markedly abnormal with evidence of right pleural effusion and mediastinal lymphadenopathy. No cardiomegaly was noted. Patient was reassessed. Following albuterol breath sounds were much lower on the left and diminished breath sounds on the right as well as particularly at the right base ray will be noted. Patient was discussed with radiology who recommended CT of the chest with IV contrast to further characterize patient's pathology. CBC and conference with panel were ordered. I-STAT was canceled. Patient continued to have absolutely no chest pain and confirmed that she did not have chest pain. I was not concerned for pulmonary embolus given prescription of patient's symptoms. She was not hypoxic nor tachycardic. Patient was moved to acute care area and I will continue to follow her care throughout her course in the ED.  4:35 PM Following return of all the patient's labs and studies we discussed her new findings which were indicative of a malignant process. I discussed the patient with the oncologist on call, Dr. Caron Presume. Given the patient's complex presentation he felt that she should be transferred to a tertiary care center. Patient was transferred to [facility] for further management. Dr. Caron Presume arranged for her to be admitted to the oncology service there for further workup. Patient remained hemodynamically stable while here in the emergency department.   I personally performed the services described in this documentation, which was scribed in my presence. The recorded information has been reviewed and considered.       Cyndra Numbers, MD 12/17/11 [phone]

## 2011-12-17 NOTE — Progress Notes (Signed)
Referral MD Dr Earlie Lou  Reason for Referral: SOB, anterior mediastinal mass  No chief complaint on file. : SOB  [id number] healthy 27 yo AAF womann , s/p recent C-section for normal child , presents with 2 wk hx of SOB and tender chest wall nodule. Cxr showed large rt effusion and widened mediastinum. CBC showed elevated wbc, with abnormal lymphocytes, likely lymphoblasts. ~ 72mo ago while post-op from c section she was noted to have an elevated wbc, with anemia. She denies, fever, chills, sweats or weight changes, h/a or visual changes. :  Past Surgical History  Procedure Date  . Cholecystectomy     age 1  . Choley   . Cesarean section 11/20/2011    Procedure: CESAREAN SECTION;  Surgeon: Purcell Nails, MD;  Location: [facility];  Service: Gynecology;  Laterality: N/A;  Primary Cesarean Section Delivery Baby Girl @ 2350  :  No current facility-administered medications for this visit. No current outpatient prescriptions on file. Facility-Administered Medications Ordered in Other Visits: albuterol (PROVENTIL HFA;VENTOLIN HFA) 108 (90 BASE) MCG/ACT inhaler 2 puff, 2 puff, Inhalation, Q4H PRN, Cyndra Numbers, MD, 2 puff at 12/17/11 1152;  iohexol (OMNIPAQUE) 300 MG/ML solution 80 mL, 80 mL, Intravenous, Once PRN, Medication Radiologist, MD, 80 mL at 12/17/11 1429:    :  No Known Allergies:  Family History  Problem Relation Age of Onset  . Anesthesia problems Neg Hx   . Alcohol abuse Mother   . Lupus Brother   :both parents deceased- mother cirrhosis, father -?  History   Social History  . Marital Status: Single, engaged,     Spouse Name:     Number of Children: 3  . Years of Education: 12   Occupational History  .  [employer], undemployed   Social History Main Topics  . Smoking status: Never Smoker   . Smokeless tobacco: Never Used  . Alcohol Use: No  . Drug Use: No  . Sexually Active: Yes -- Female partner(s)    Birth Control/ Protection: None, Other-see comments    Other Topics Concern  . Not on file   Social History Narrative   ** Merged History Encounter **   :  Pertinent items are noted in HPI.  Exam: @IPVITALS @ Anxious appearing, health woman in NAD General appearance: alert, cooperative and appears stated age Eyes: conjunctivae/corneas clear. PERRL, EOM's intact. Fundi benign., contacts in place Throat: lips, mucosa, and tongue normal; teeth and gums normal Neck: no adenopathy, no carotid bruit, no JVD, supple, symmetrical, trachea midline and thyroid not enlarged, symmetric, has tender nodule over breastbone  Back: symmetric, no curvature. ROM normal. No CVA tenderness. Resp: decreased a/e rt base Cardio: regular rate and rhythm, S1, S2 normal, no murmur, click, rub or gallop and normal apical impulse GI: soft, non-tender; bowel sounds normal; no masses,  no organomegaly Extremities: extremities normal, atraumatic, no cyanosis or edema Pulses: 2+ and symmetric Lymph nodes: Cervical, supraclavicular, and axillary nodes normal. and bilateral supraclavicular adenopathy, shotty nodes rt axilla. Neurologic: Grossly normal   Basename 12/17/11 1244  WBC 17.8*  HGB 7.9*  HCT 25.6*  PLT 236    Basename 12/17/11 1244  NA 138  K 3.6  CL 106  CO2 22  GLUCOSE 88  BUN 5*  CREATININE 0.77  CALCIUM 8.6    Blood smear review: atypical lymphocytes, blastic appearing  Pathology:n/a  Dg Chest 2 View  12/17/2011  *RADIOLOGY REPORT*  Clinical Data: Shortness of breath.  CHEST - 2 VIEW  Comparison: 05/12/2008.  Findings: There is a large right-sided pleural effusion with overlying atelectasis or infiltrate.  The mediastinal contours are prominent and findings suspicious for mediastinal adenopathy or mass.  The left lung is clear.  The heart size is normal.  The aorta is normal in caliber.  The bony thorax is intact.  Bilateral cervical ribs are noted.  IMPRESSION:  Abnormal chest x-ray with a large right pleural effusion and overlying  atelectasis or infiltrate along with probable mediastinal mass or adenopathy.  Recommend chest CT with contrast for further evaluation.  Original Report Authenticated By: P. Loralie Champagne, M.D.   Ct Chest W Contrast  12/17/2011  *RADIOLOGY REPORT*  Clinical Data: Shortness of breath and abnormal chest x-ray.  CT CHEST WITH CONTRAST  Technique:  Multidetector CT imaging of the chest was performed following the standard protocol during bolus administration of intravenous contrast.  Contrast: 80mL OMNIPAQUE IOHEXOL 300 MG/ML  SOLN  Comparison: Chest x-ray, same date.  Findings: Examination of the chest wall demonstrates no breast masses.  There are numerous small bilateral axillary lymph nodes along with a slightly enlarged bilateral supraclavicular nodes. The thyroid gland appears normal.  The bony thorax is intact.  No destructive bone lesions or spinal canal compromise.  There is a large anterior mediastinal mass measuring approximately 9 x 8 x 7 cm.  There are scattered mediastinal lymph nodes.  The aorta is normal in caliber.  The major branch vessels are normal. The mass does surround and compresses the superior vena cava.  The esophagus is grossly normal.  There is enhancing pleural soft tissue density on the right side suspicious for pleural extension of tumor and this likely accounts for the large right pleural effusion.  This also a small left pleural effusion and a small focus of enhancing soft tissue on image number 22 just posterior to the aorta.  The upper abdomen is unremarkable.  A small hemangioma is noted in the liver.  Surgical clips from cholecystectomy are noted along with mild common bile duct dilatation.  IMPRESSION:  1.  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 2.  Fairly significant compression of the SVC.  Original Report Authenticated  By: P. Loralie Champagne, M.D.    Assessment and Plan:   26 yo woman with new onset cough, large pleural effusion, mediastinal mass with adenopathy, elevated wbc atypical lymphocytesl; all suggestive of lymphoblastic lymphoma. I discussed the case with the patient and family and suggested transfer to [facility]. They have agredd . I also discussed the case with Dr D Gretel Acre @ [facility] who has agreed to take the pt in transfer.   Pierce Crane MD, [facility] 12/17/11      Pierce Crane MD

## 2011-12-17 NOTE — ED Notes (Signed)
Pt reports yesterday she noticed a palpable "knot" in her chest, midsternal area, and SOB also. Pt also states her face "Feels swollen." pt breathing easily and able to speak in full clear sentences

## 2011-12-17 NOTE — ED Notes (Signed)
Patient transported to X-ray 

## 2011-12-17 NOTE — H&P (Signed)
Referral MD Dr Megan Hunt  Reason for Referral: SOB, anterior mediastinal mass  No chief complaint on file. : SOB  HPI:Previously healthy 26 yo AAF womann , s/p recent C-section for normal child , presents with 2 wk hx of SOB and tender chest wall nodule. Cxr showed large rt effusion and widened mediastinum. CBC showed elevated wbc, with abnormal lymphocytes, likely lymphoblasts. ~ 1mo ago while post-op from c section she was noted to have an elevated wbc, with anemia. She denies, fever, chills, sweats or weight changes, h/a or visual changes. :  Past Surgical History  Procedure Date  . Cholecystectomy     age 15  . Choley   . Cesarean section 11/20/2011    Procedure: CESAREAN SECTION;  Surgeon: Angela Y Roberts, MD;  Location: [facility];  Service: Gynecology;  Laterality: N/A;  Primary Cesarean Section Delivery Baby Girl @ 2350  :  No current facility-administered medications for this visit. No current outpatient prescriptions on file. Facility-Administered Medications Ordered in Other Visits: albuterol (PROVENTIL HFA;VENTOLIN HFA) 108 (90 BASE) MCG/ACT inhaler 2 puff, 2 puff, Inhalation, Q4H PRN, Meagan Hunt, MD, 2 puff at 12/17/11 1152;  iohexol (OMNIPAQUE) 300 MG/ML solution 80 mL, 80 mL, Intravenous, Once PRN, Medication Radiologist, MD, 80 mL at 12/17/11 1429:    :  No Known Allergies:  Family History  Problem Relation Age of Onset  . Anesthesia problems Neg Hx   . Alcohol abuse Mother   . Lupus Brother   :both parents deceased- mother cirrhosis, father -?  History   Social History  . Marital Status: Single, engaged,     Spouse Name:     Number of Children: 3  . Years of Education: 12   Occupational History  .  [employer], undemployed   Social History Main Topics  . Smoking status: Never Smoker   . Smokeless tobacco: Never Used  . Alcohol Use: No  . Drug Use: No  . Sexually Active: Yes -- Female partner(s)    Birth Control/ Protection: None, Other-see comments    Other Topics Concern  . Not on file   Social History Narrative   ** Merged History Encounter **   :  Pertinent items are noted in HPI.  Exam: @IPVITALS@ Anxious appearing, health woman in NAD General appearance: alert, cooperative and appears stated age Eyes: conjunctivae/corneas clear. PERRL, EOM's intact. Fundi benign., contacts in place Throat: lips, mucosa, and tongue normal; teeth and gums normal Neck: no adenopathy, no carotid bruit, no JVD, supple, symmetrical, trachea midline and thyroid not enlarged, symmetric, has tender nodule over breastbone  Back: symmetric, no curvature. ROM normal. No CVA tenderness. Resp: decreased a/e rt base Cardio: regular rate and rhythm, S1, S2 normal, no murmur, click, rub or gallop and normal apical impulse GI: soft, non-tender; bowel sounds normal; no masses,  no organomegaly Extremities: extremities normal, atraumatic, no cyanosis or edema Pulses: 2+ and symmetric Lymph nodes: Cervical, supraclavicular, and axillary nodes normal. and bilateral supraclavicular adenopathy, shotty nodes rt axilla. Neurologic: Grossly normal   Basename 12/17/11 1244  WBC 17.8*  HGB 7.9*  HCT 25.6*  PLT 236    Basename 12/17/11 1244  NA 138  K 3.6  CL 106  CO2 22  GLUCOSE 88  BUN 5*  CREATININE 0.77  CALCIUM 8.6    Blood smear review: atypical lymphocytes, blastic appearing  Pathology:n/a  Dg Chest 2 View  12/17/2011  *RADIOLOGY REPORT*  Clinical Data: Shortness of breath.  CHEST - 2 VIEW  Comparison: 05/12/2008.    Findings: There is a large right-sided pleural effusion with overlying atelectasis or infiltrate.  The mediastinal contours are prominent and findings suspicious for mediastinal adenopathy or mass.  The left lung is clear.  The heart size is normal.  The aorta is normal in caliber.  The bony thorax is intact.  Bilateral cervical ribs are noted.  IMPRESSION:  Abnormal chest x-ray with a large right pleural effusion and overlying  atelectasis or infiltrate along with probable mediastinal mass or adenopathy.  Recommend chest CT with contrast for further evaluation.  Original Report Authenticated By: P. MARK GALLERANI, M.D.   Ct Chest W Contrast  12/17/2011  *RADIOLOGY REPORT*  Clinical Data: Shortness of breath and abnormal chest x-ray.  CT CHEST WITH CONTRAST  Technique:  Multidetector CT imaging of the chest was performed following the standard protocol during bolus administration of intravenous contrast.  Contrast: 80mL OMNIPAQUE IOHEXOL 300 MG/ML  SOLN  Comparison: Chest x-ray, same date.  Findings: Examination of the chest wall demonstrates no breast masses.  There are numerous small bilateral axillary lymph nodes along with a slightly enlarged bilateral supraclavicular nodes. The thyroid gland appears normal.  The bony thorax is intact.  No destructive bone lesions or spinal canal compromise.  There is a large anterior mediastinal mass measuring approximately 9 x 8 x 7 cm.  There are scattered mediastinal lymph nodes.  The aorta is normal in caliber.  The major branch vessels are normal. The mass does surround and compresses the superior vena cava.  The esophagus is grossly normal.  There is enhancing pleural soft tissue density on the right side suspicious for pleural extension of tumor and this likely accounts for the large right pleural effusion.  This also a small left pleural effusion and a small focus of enhancing soft tissue on image number 22 just posterior to the aorta.  The upper abdomen is unremarkable.  A small hemangioma is noted in the liver.  Surgical clips from cholecystectomy are noted along with mild common bile duct dilatation.  IMPRESSION:  1.  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 2.  Fairly significant compression of the SVC.  Original Report Authenticated  By: P. MARK GALLERANI, M.D.    Assessment and Plan:   26 yo woman with new onset cough, large pleural effusion, mediastinal mass with adenopathy, elevated wbc atypical lymphocytesl; all suggestive of lymphoblastic lymphoma. I discussed the case with the patient and family and suggested transfer to [facility]. They have agredd . I also discussed the case with Dr D Berenzon @ [facility] who has agreed to take the pt in transfer.   Kwasi Joung MD, [facility] 12/17/11      Chetan Mehring MD  

## 2011-12-17 NOTE — ED Notes (Signed)
MD at bedside., Dr Donnie Coffin with oncology

## 2012-02-17 ENCOUNTER — Encounter (HOSPITAL_COMMUNITY): Payer: Self-pay | Admitting: *Deleted

## 2012-02-17 ENCOUNTER — Emergency Department (HOSPITAL_COMMUNITY): Payer: Medicaid Other

## 2012-02-17 ENCOUNTER — Emergency Department (HOSPITAL_COMMUNITY)
Admission: EM | Admit: 2012-02-17 | Discharge: 2012-02-18 | Disposition: A | Discharge status: Home / Self Care | Payer: Medicaid Other | Attending: Emergency Medicine | Admitting: Emergency Medicine

## 2012-02-17 DIAGNOSIS — R112 Nausea with vomiting, unspecified: Secondary | ICD-10-CM

## 2012-02-17 DIAGNOSIS — C9100 Acute lymphoblastic leukemia not having achieved remission: Secondary | ICD-10-CM | POA: Insufficient documentation

## 2012-02-17 DIAGNOSIS — E860 Dehydration: Secondary | ICD-10-CM | POA: Insufficient documentation

## 2012-02-17 DIAGNOSIS — Z79899 Other long term (current) drug therapy: Secondary | ICD-10-CM | POA: Insufficient documentation

## 2012-02-17 HISTORY — DX: Acute lymphoblastic leukemia not having achieved remission: C91.00

## 2012-02-17 LAB — CBC WITH DIFFERENTIAL/PLATELET
Basophils Absolute: 0 10*3/uL (ref 0.0–0.1)
Eosinophils Absolute: 0 10*3/uL (ref 0.0–0.7)
Eosinophils Relative: 0 % (ref 0–5)
Lymphocytes Relative: 3 % — ABNORMAL LOW (ref 12–46)
MCV: 79.9 fL (ref 78.0–100.0)
Platelets: 386 10*3/uL (ref 150–400)
RDW: 15.2 % (ref 11.5–15.5)
WBC: 7.3 10*3/uL (ref 4.0–10.5)

## 2012-02-17 LAB — URINALYSIS, ROUTINE W REFLEX MICROSCOPIC
Leukocytes, UA: NEGATIVE
Nitrite: NEGATIVE
Protein, ur: NEGATIVE mg/dL
Urobilinogen, UA: 1 mg/dL (ref 0.0–1.0)

## 2012-02-17 LAB — HEPATIC FUNCTION PANEL
AST: 26 U/L (ref 0–37)
Albumin: 3.7 g/dL (ref 3.5–5.2)
Alkaline Phosphatase: 83 U/L (ref 39–117)
Total Bilirubin: 0.5 mg/dL (ref 0.3–1.2)

## 2012-02-17 LAB — BASIC METABOLIC PANEL
CO2: 23 mEq/L (ref 19–32)
Calcium: 10 mg/dL (ref 8.4–10.5)
GFR calc non Af Amer: >90 mL/min (ref >90)
Sodium: 140 mEq/L (ref 135–145)

## 2012-02-17 LAB — PREGNANCY, URINE: Preg Test, Ur: NEGATIVE

## 2012-02-17 MED ORDER — ONDANSETRON 8 MG PO TBDP
8.0000 mg | ORAL_TABLET | Freq: Once | ORAL | Status: AC
  Administered 2012-02-17: 8 mg via ORAL
  Filled 2012-02-17: qty 1

## 2012-02-17 MED ORDER — ONDANSETRON HCL 4 MG/2ML IJ SOLN
4.0000 mg | Freq: Once | INTRAMUSCULAR | Status: AC
  Administered 2012-02-17: 4 mg via INTRAVENOUS
  Filled 2012-02-17: qty 2

## 2012-02-17 MED ORDER — SODIUM CHLORIDE 0.9 % IV SOLN: 1000.0000 mL | INTRAVENOUS | Status: DC

## 2012-02-17 MED ORDER — SODIUM CHLORIDE 0.9 % IV SOLN
1000.0000 mL | Freq: Once | INTRAVENOUS | Status: AC
  Administered 2012-02-17: 1000 mL via INTRAVENOUS

## 2012-02-17 MED ORDER — SODIUM CHLORIDE 0.9 % IV SOLN: 1000.0000 mL | Freq: Once | INTRAVENOUS | Status: DC

## 2012-02-17 MED ORDER — ZOLPIDEM TARTRATE 5 MG PO TABS: 5.0000 mg | ORAL_TABLET | Freq: Every evening | ORAL | Status: DC | PRN

## 2012-02-17 MED ORDER — ONDANSETRON 8 MG PO TBDP: 8.0000 mg | ORAL_TABLET | Freq: Three times a day (TID) | ORAL | Status: AC | PRN

## 2012-02-17 NOTE — ED Notes (Signed)
Pt not answering questions. Friend at bedside providing information. Pt has hx of CA and is having n/v and extreme weakness.

## 2012-02-17 NOTE — ED Notes (Signed)
Pt presents w/ c/o n/v, generalized weakness and pain. 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

## 2012-02-17 NOTE — ED Notes (Signed)
Dr. Talking with Pt's family.

## 2012-02-17 NOTE — ED Notes (Signed)
Attempted to access port w/o success. IV team notified.

## 2012-02-18 LAB — ABO/RH: ABO/RH(D): A POS

## 2012-02-18 NOTE — ED Provider Notes (Addendum)
History     CSN: [id number]  Arrival date & time 02/17/12  1949   First MD Initiated Contact with Patient 02/17/12 2037      Chief Complaint  Patient presents with  . Nausea  . Emesis     The history is provided by the patient.   patient presents with 48 hours of nausea vomiting and generalized weakness.  She denies abdominal pain.  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  She denies dysuria or urinary frequency.  She has no flank pain.  She denies chest pain shortness of breath.  Her last chemotherapy was 8 days ago.  Her symptoms are mild to moderate in severity.  She denies melena or hematochezia.   Past Medical History  Diagnosis Date  . Urinary tract infection   . Anxiety 2007    SHORT COURSE OF MEDS  . Anemia     TAKING FE  . Infection     UIT X 1  . Infection     TRICH X 1  . Infection     YEAST X1  . 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    Past Surgical History  Procedure Date  . Cholecystectomy     age 75  . Choley   . Cesarean section 11/20/2011    Procedure: CESAREAN SECTION;  Surgeon: Purcell Nails, MD;  Location: [facility];  Service: Gynecology;  Laterality: N/A;  Primary Cesarean Section Delivery Baby Girl @ 2350    Family History  Problem Relation Age of Onset  . Anesthesia problems Neg Hx   . Alcohol abuse Mother   . Lupus Brother     History  Substance Use Topics  . Smoking status: Never Smoker   . Smokeless tobacco: Never Used  . Alcohol Use: No    OB History    Grav Para Term Preterm Abortions TAB SAB Ect Mult Living   6 4 3 1 2  0 2 0 0 3      Review of Systems  All other systems reviewed and are negative.    Allergies  Review of patient's allergies indicates no known allergies.  Home Medications   Current Outpatient Rx  Name Route Sig Dispense Refill  . ENOXAPARIN SODIUM 60 MG/0.6ML [place] SOLN Subcutaneous Inject 60 mg into the skin daily.    Marland Kitchen ONDANSETRON 8 MG PO TBDP  Oral Take 1 tablet (8 mg total) by mouth every 8 (eight) hours as needed for nausea. 15 tablet 0  . ZOLPIDEM TARTRATE 5 MG PO TABS Oral Take 1 tablet (5 mg total) by mouth at bedtime as needed for sleep. 7 tablet 0    BP 124/97  Pulse 106  Temp 98.2 F (36.8 C) (Oral)  Resp 16  SpO2 100%  Physical Exam  Nursing note and vitals reviewed. Constitutional: She is oriented to person, place, and time. She appears well-developed and well-nourished. No distress.  HENT:  Head: Normocephalic and atraumatic.  Eyes: EOM are normal.  Neck: Normal range of motion.  Cardiovascular: Normal rate, regular rhythm and normal heart sounds.   Pulmonary/Chest: Effort normal and breath sounds normal.       Port in right anterior chest without secondary signs of infection.  Abdominal: Soft. She exhibits no distension. There is no tenderness.  Musculoskeletal: Normal range of motion.  Neurological: She is alert and oriented to person, place, and time.  Skin: Skin is warm and dry.  Psychiatric: She has a normal mood and affect. Judgment normal.    ED Course  Procedures (including critical care time)  Labs Reviewed  CBC WITH DIFFERENTIAL - Abnormal; Notable for the following:    RBC 5.42 (*)     Hemoglobin 15.5 (*)     Neutrophils Relative 92 (*)     Lymphocytes Relative 3 (*)     Lymphs Abs 0.2 (*)     All other components within normal limits  BASIC METABOLIC PANEL - Abnormal; Notable for the following:    Glucose, Bld 149 (*)     All other components within normal limits  URINALYSIS, ROUTINE W REFLEX MICROSCOPIC - Abnormal; Notable for the following:    Hgb urine dipstick MODERATE (*)     Ketones, ur TRACE (*)     All other components within normal limits  LACTIC ACID, PLASMA - Abnormal; Notable for the following:    Lactic Acid, Venous 3.0 (*)     All other components within normal limits  LIPASE, BLOOD - Abnormal; Notable for the following:    Lipase 8 (*)     All other components within  normal limits  PREGNANCY, URINE  TYPE AND SCREEN  HEPATIC FUNCTION PANEL  URINE MICROSCOPIC-ADD ON  CULTURE, BLOOD (ROUTINE X 2)  CULTURE, BLOOD (ROUTINE X 2)  ABO/RH   Dg Chest Portable 1 View  02/17/2012  *RADIOLOGY REPORT*  Clinical Data: 26 year old female with nausea vomiting weakness. Leukemia.  PORTABLE CHEST - 1 VIEW  Comparison: 12/17/2011 and earlier.  Findings: Semi upright AP portable view 2211 hours.  Right chest Port-A-Cath. Normal cardiac size and mediastinal contours. Visualized tracheal air column is within normal limits.  The lungs are clear.  No pneumothorax or effusion.  Right upper quadrant surgical clips re-identified.  IMPRESSION: No acute cardiopulmonary abnormality. Interval resolved right pleural effusion and mediastinal widening.   Original Report Authenticated By: Harley Hallmark, M.D.     I personally reviewed the imaging tests through PACS system  I reviewed available ER/hospitalization records thought the EMR   1. Nausea & vomiting   2. Dehydration       MDM  The patient feels much better after IV fluids.  Her abdomen is benign on exam.  Her port is without secondary signs of infection.  She's afebrile here.  Her lab studies are normal.  I offered her the option of me contacting her oncologist in [facility] [place] however she stated she felt much better was agreeable to going home with antinausea medicine.  Instructed her to followup with her [facility] oncologist for any new or worsening symptoms including but not limited to development of fever abdominal pain.        Lyanne Co, MD 02/18/12 0110    5:33 AM 02/19/2012 I was called about Gram (+) cocci in cluster in her blood cultures. I spoke with Alinda Money, the patients husband and told him to go to the [facility] ER now. He reports she continues to feel poorly at this time.  I discussed the case with Dr Theresia Lo in the ER at [facility] who is aware of the patients findings and will be expecting the patient. My  documentation and labs including blood culture results will be faxed to the ER    Lyanne Co, MD 02/19/12 [phone]

## 2012-02-19 NOTE — ED Notes (Signed)
MD Patria Mane got in touch with pt and pt is going to [facility] for re-eval.

## 2012-02-19 NOTE — ED Notes (Signed)
Pt has + blood culture per [employer].  Showed chart to MD [facility] for review.  MD Patria Mane sts pt needs to go to [facility] or come back to [place].  Called and left Voicemail for pt to call back ASAP.

## 2012-02-20 LAB — CULTURE, BLOOD (ROUTINE X 2)

## 2012-02-24 LAB — CULTURE, BLOOD (ROUTINE X 2): Culture: NO GROWTH

## 2012-04-24 IMAGING — US US OB COMP +14 WK
1 series · 12 of 28 positions shown · non-contrast
Comparison: none

[Series 1: us ob comp +14 wk · 12 of 53 slices shown]
[im 2/53]
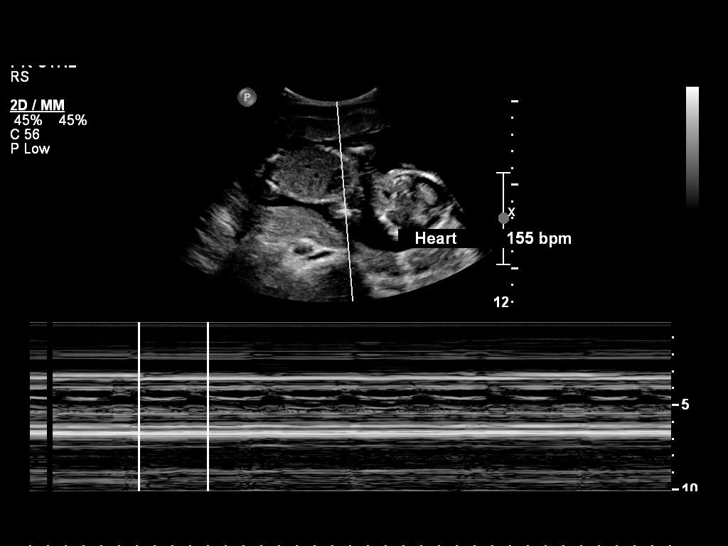
[im 6/53]
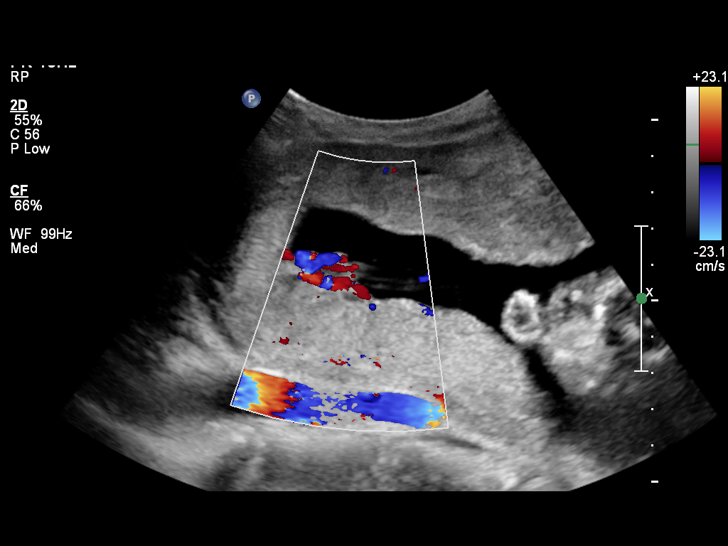
[im 10/53]
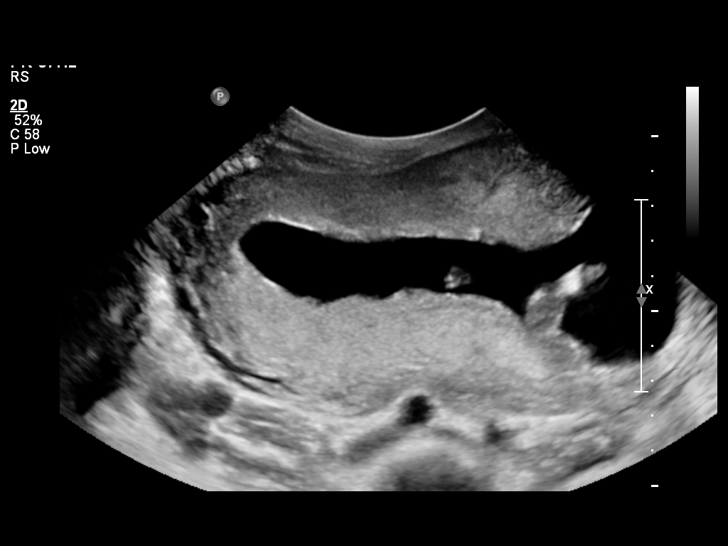
[im 16/53]
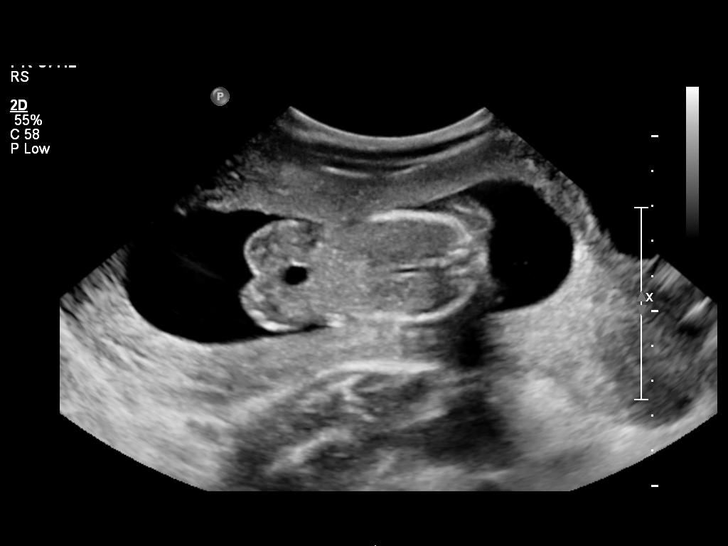
[im 20/53]
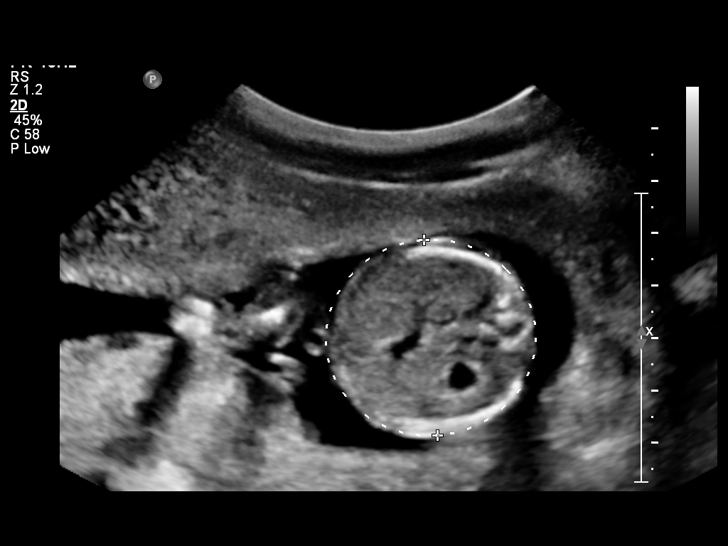
[im 24/53]
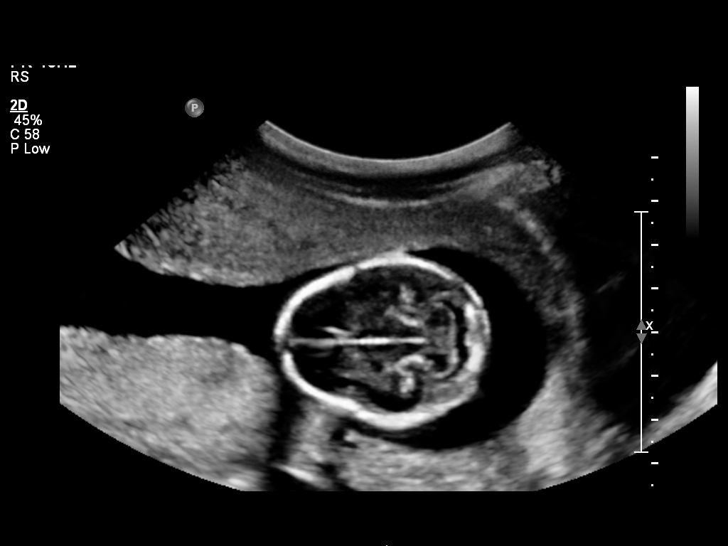
[im 29/53]
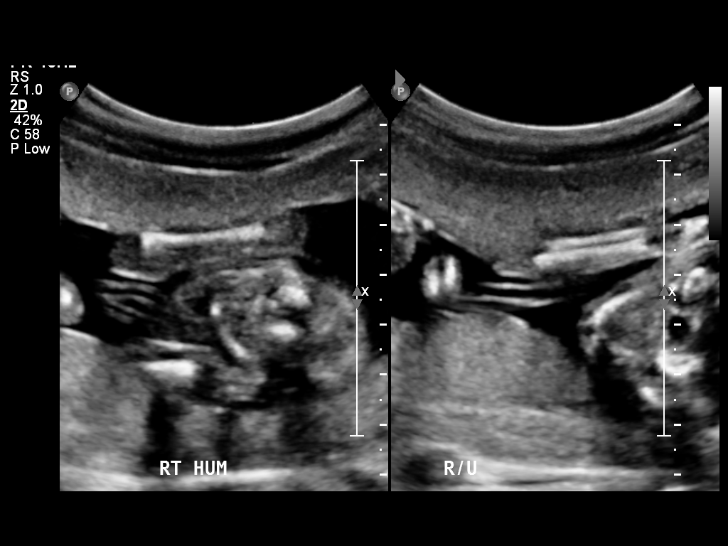
[im 33/53]
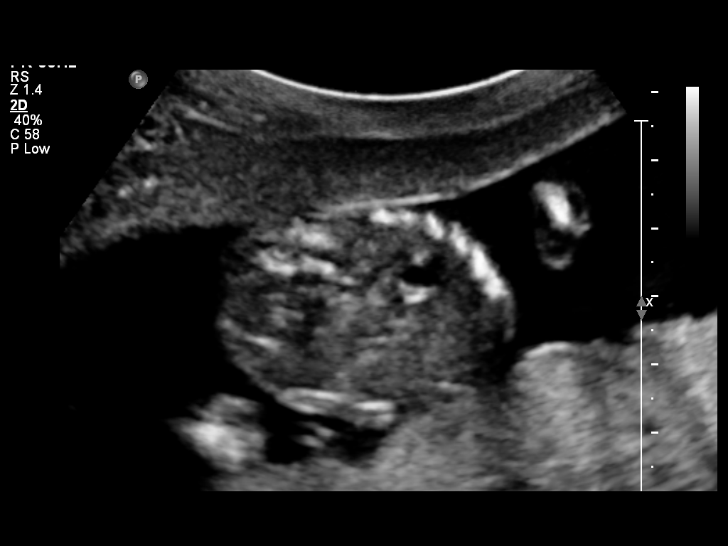
[im 37/53]
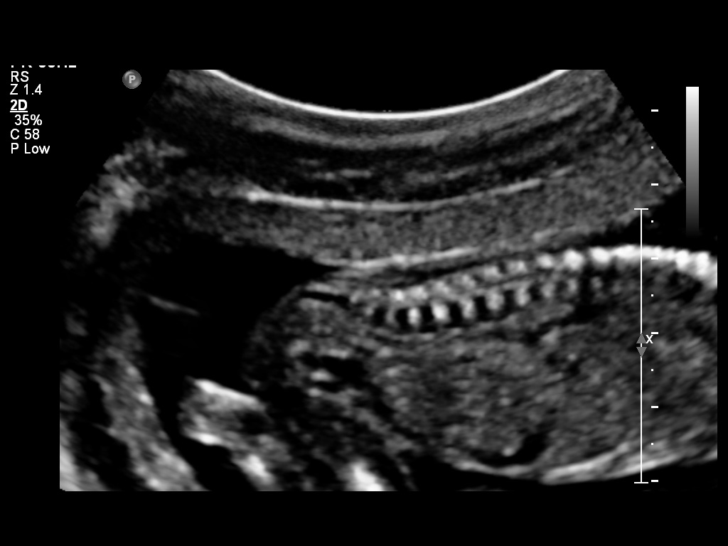
[im 43/53]
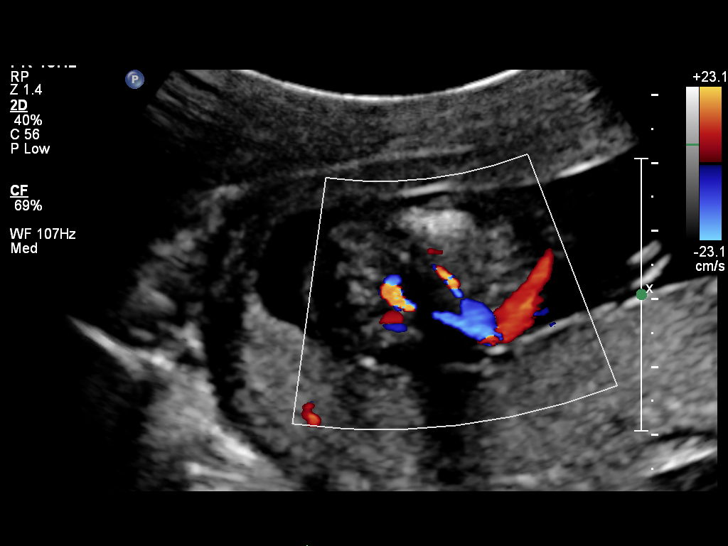
[im 47/53]
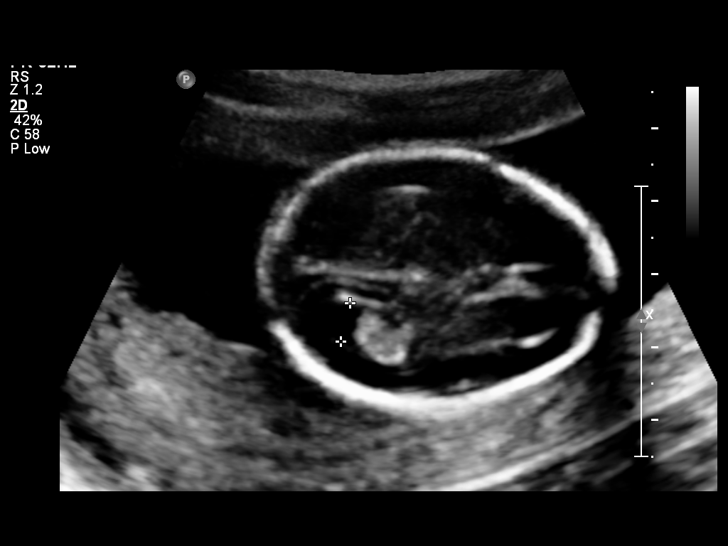
[im 51/53]
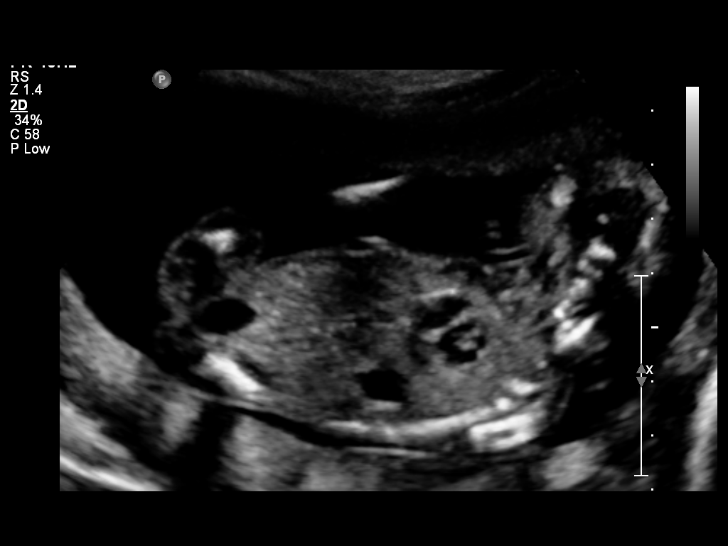

[12 of 28 positions shown; findings below may reference images not displayed]

OBSTETRICS REPORT
                      (Signed Final 07/06/2010 [DATE])

 Order#:         _I
Procedures

 US OB COMP +14 WK                                     76805.1
Indications

 Basic anatomic survey
 Hyperemesis gravidarum
 Anemia
 Anxiety
Fetal Evaluation

 Fetal Heart Rate:  155                          bpm
 Cardiac Activity:  Observed
 Presentation:      Variable
 Placenta:          Anterior & Posterior,
                    above cervical os
 P. Cord            Visualized
 Insertion:

 Amniotic Fluid
 AFI FV:      Subjectively within normal limits
                                             Larg Pckt:     4.7  cm
Biometry

 BPD:     35.5  mm     G. Age:  16w 6d                CI:        67.18   70 - 86
                                                      FL/HC:      18.2   14.6 -

 HC:     138.7  mm     G. Age:  17w 2d       32  %    HC/AC:      1.15   1.07 -

 AC:     120.4  mm     G. Age:  17w 5d       59  %    FL/BPD:
 FL:      25.2  mm     G. Age:  17w 5d       53  %    FL/AC:      20.9   20 - 24
 NFT:     2.63  mm

 Est. FW:     202  gm      0 lb 7 oz     55  %
Gestational Age

 LMP:           18w 1d        Date:  03/01/10                 EDD:   12/06/10
 U/S Today:     17w 3d                                        EDD:   12/11/10
 Best:          17w 3d     Det. By:  U/S (07/06/10)           EDD:   12/11/10
Anatomy
 Cranium:           Appears normal      Aortic Arch:       Basic anatomy
                                                           exam per order
 Fetal Cavum:       Appears normal      Ductal Arch:       Basic anatomy
                                                           exam per order
 Ventricles:        Appears normal      Diaphragm:         Basic anatomy
                                                           exam per order
 Choroid Plexus:    Appears normal      Stomach:           Appears normal
 Cerebellum:        Appears normal      Abdomen:           Appears normal
 Posterior Fossa:   Appears normal      Abdominal Wall:    Appears nml
                                                           (cord insert,
                                                           abd wall)
 Nuchal Fold:       Appears normal      Cord Vessels:      Appears normal
                    (neck, nuchal                          (3 vessel cord)
                    fold)
 Face:              Lips appear         Kidneys:           Appear normal
                    normal
 Heart:             Appears normal      Bladder:           Appears normal
                    (4 chamber &
                    axis)
 RVOT:              Basic anatomy       Spine:             Appears normal
                    exam per order
 LVOT:              Basic anatomy       Limbs:             Four extremities
                    exam per order                         seen
Cervix Uterus Adnexa

 Cervical Length:    3.5      cm

 Cervix:       Normal appearance by transabdominal scan.

 Adnexa:     No abnormality visualized.
Impression

 Single live IUP measuring 41w8d, normal anatomic survey
 with a good quality exam possible.
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
## 2012-06-14 ENCOUNTER — Telehealth: Payer: Self-pay | Admitting: Oncology

## 2012-06-14 NOTE — Telephone Encounter (Signed)
LVOM for pt to return call in re NP appt.  °

## 2012-09-18 ENCOUNTER — Emergency Department (HOSPITAL_COMMUNITY): Payer: Self-pay

## 2012-09-18 ENCOUNTER — Encounter (HOSPITAL_COMMUNITY): Payer: Self-pay

## 2012-09-18 ENCOUNTER — Emergency Department (HOSPITAL_COMMUNITY)
Admission: EM | Admit: 2012-09-18 | Discharge: 2012-09-18 | Disposition: A | Discharge status: Home / Self Care | Payer: Self-pay | Attending: Emergency Medicine | Admitting: Emergency Medicine

## 2012-09-18 DIAGNOSIS — Z862 Personal history of diseases of the blood and blood-forming organs and certain disorders involving the immune mechanism: Secondary | ICD-10-CM | POA: Insufficient documentation

## 2012-09-18 DIAGNOSIS — Z8619 Personal history of other infectious and parasitic diseases: Secondary | ICD-10-CM | POA: Insufficient documentation

## 2012-09-18 DIAGNOSIS — E860 Dehydration: Secondary | ICD-10-CM | POA: Insufficient documentation

## 2012-09-18 DIAGNOSIS — Z79899 Other long term (current) drug therapy: Secondary | ICD-10-CM | POA: Insufficient documentation

## 2012-09-18 DIAGNOSIS — Z8659 Personal history of other mental and behavioral disorders: Secondary | ICD-10-CM | POA: Insufficient documentation

## 2012-09-18 DIAGNOSIS — R112 Nausea with vomiting, unspecified: Secondary | ICD-10-CM | POA: Insufficient documentation

## 2012-09-18 DIAGNOSIS — C9100 Acute lymphoblastic leukemia not having achieved remission: Secondary | ICD-10-CM | POA: Insufficient documentation

## 2012-09-18 DIAGNOSIS — Z87440 Personal history of urinary (tract) infections: Secondary | ICD-10-CM | POA: Insufficient documentation

## 2012-09-18 HISTORY — DX: Leukemia, unspecified not having achieved remission: C95.90

## 2012-09-18 LAB — COMPREHENSIVE METABOLIC PANEL
ALT: 38 U/L — ABNORMAL HIGH (ref 0–35)
Alkaline Phosphatase: 51 U/L (ref 39–117)
CO2: 23 mEq/L (ref 19–32)
Calcium: 9.3 mg/dL (ref 8.4–10.5)
GFR calc Af Amer: >90 mL/min (ref >90)
GFR calc non Af Amer: >90 mL/min (ref >90)
Glucose, Bld: 134 mg/dL — ABNORMAL HIGH (ref 70–99)
Potassium: 3.5 mEq/L (ref 3.5–5.1)
Sodium: 139 mEq/L (ref 135–145)
Total Bilirubin: 0.4 mg/dL (ref 0.3–1.2)

## 2012-09-18 LAB — URINALYSIS, ROUTINE W REFLEX MICROSCOPIC
Ketones, ur: NEGATIVE mg/dL
Leukocytes, UA: NEGATIVE
Nitrite: NEGATIVE
Protein, ur: NEGATIVE mg/dL

## 2012-09-18 LAB — CBC WITH DIFFERENTIAL/PLATELET
Eosinophils Relative: 0 % (ref 0–5)
Lymphocytes Relative: 4 % — ABNORMAL LOW (ref 12–46)
Lymphs Abs: 0.3 10*3/uL — ABNORMAL LOW (ref 0.7–4.0)
MCV: 89.9 fL (ref 78.0–100.0)
Platelets: 211 10*3/uL (ref 150–400)
RBC: 3.96 MIL/uL (ref 3.87–5.11)
WBC: 8.7 10*3/uL (ref 4.0–10.5)

## 2012-09-18 MED ORDER — HEPARIN SOD (PORK) LOCK FLUSH 100 UNIT/ML IV SOLN
INTRAVENOUS | Status: AC
  Administered 2012-09-18: 500 [IU]
  Filled 2012-09-18: qty 5

## 2012-09-18 MED ORDER — SODIUM CHLORIDE 0.9 % IV SOLN
1000.0000 mL | INTRAVENOUS | Status: DC
  Administered 2012-09-18: 1000 mL via INTRAVENOUS

## 2012-09-18 MED ORDER — SODIUM CHLORIDE 0.9 % IV SOLN
1000.0000 mL | Freq: Once | INTRAVENOUS | Status: AC
  Administered 2012-09-18: 1000 mL via INTRAVENOUS

## 2012-09-18 MED ORDER — PROMETHAZINE HCL 25 MG/ML IJ SOLN
12.5000 mg | Freq: Once | INTRAMUSCULAR | Status: AC
  Administered 2012-09-18: 12.5 mg via INTRAVENOUS
  Filled 2012-09-18: qty 1

## 2012-09-18 MED ORDER — PROMETHAZINE HCL 25 MG PO TABS: 25.0000 mg | ORAL_TABLET | Freq: Four times a day (QID) | ORAL | Status: DC | PRN

## 2012-09-18 MED ORDER — PROMETHAZINE HCL 25 MG RE SUPP: 25.0000 mg | Freq: Four times a day (QID) | RECTAL | Status: DC | PRN

## 2012-09-18 MED ORDER — ONDANSETRON HCL 4 MG/2ML IJ SOLN
4.0000 mg | INTRAMUSCULAR | Status: DC | PRN
  Administered 2012-09-18 (×3): 4 mg via INTRAVENOUS
  Filled 2012-09-18 (×3): qty 2

## 2012-09-18 NOTE — ED Notes (Signed)
Still waiting for medications to be sent from Pharmacy

## 2012-09-18 NOTE — ED Provider Notes (Signed)
History     CSN: [id number]  Arrival date & time 09/18/12  1459   First MD Initiated Contact with Patient 09/18/12 1503      Chief Complaint  Patient presents with  . Emesis  . Leukemia   Level V caveat for altered mental status  (Consider location/radiation/quality/duration/timing/severity/associated sxs/prior treatment) HPI Brother relates he connected the patient last night and she seemed fine at 9 PM. In dealing during the night she started vomiting and the patient's boyfriend called a friend at 5 AM to come stay with the patient while he went to work. Evidently the patient had vomiting and at some point the brother was called. He states patient has not spoken to him. He states she just seems very weak. He carried her to the car to bring her to the ED. He states he believes she has been having vomiting. He does not know of any other symptoms.  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 He relates he just found out she has not gone to chemotherapy since December and is supposed to be getting chemotherapy monthly. He does not know why she has not been going.   Oncologist at [facility]  Past Medical History  Diagnosis Date  . Urinary tract infection   . Anxiety 2007    SHORT COURSE OF MEDS  . Anemia     TAKING FE  . Infection     UIT X 1  . Infection     TRICH X 1  . Infection     YEAST X1  . 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  . Leukemia     Past Surgical History  Procedure Laterality Date  . Cholecystectomy      age 51  . Choley    . Cesarean section  11/20/2011    Procedure: CESAREAN SECTION;  Surgeon: Purcell Nails, MD;  Location: [facility];  Service: Gynecology;  Laterality: N/A;  Primary Cesarean Section Delivery Baby Girl @ 2350    Family History  Problem Relation Age of Onset  . Anesthesia problems Neg Hx   . Alcohol abuse Mother   . Lupus Brother     History  Substance Use Topics  . Smoking status: Never Smoker   .  Smokeless tobacco: Never Used  . Alcohol Use: No   lives at home Lives with boyfriend  OB History   Grav Para Term Preterm Abortions TAB SAB Ect Mult Living   6 4 3 1 2  0 2 0 0 3      Review of Systems  Unable to perform ROS: Mental status change    Allergies  Review of patient's allergies indicates no known allergies.  Home Medications   Current Outpatient Rx  Name  Route  Sig  Dispense  Refill  . acyclovir (ZOVIRAX) 400 MG tablet   Oral   Take 400 mg by mouth 2 (two) times daily.         . fluconazole (DIFLUCAN) 200 MG tablet   Oral   Take 200 mg by mouth daily.         Marland Kitchen levofloxacin (LEVAQUIN) 750 MG tablet   Oral   Take 750 mg by mouth daily.         Marland Kitchen lidocaine-prilocaine (EMLA) cream   Topical   Apply 1 application topically as needed (1 to 2 hours prior to port access.).         Marland Kitchen medroxyPROGESTERone (DEPO-PROVERA) 150  MG/ML injection   Intramuscular   Inject 150 mg into the muscle every 3 (three) months.         . pentamidine (PENTAM) 300 MG inhalation solution   Inhalation   Inhale 300 mg into the lungs every 30 (thirty) days. For 11 doses.         Marland Kitchen PRESCRIPTION MEDICATION   Oral   Take 30 mLs by mouth 4 (four) times daily. Diphenhydramine "Mucositis Mouthwash" suspension.         . promethazine (PHENERGAN) 12.5 MG tablet   Oral   Take 12.5 mg by mouth every 6 (six) hours as needed for nausea.         Marland Kitchen enoxaparin (LOVENOX) 60 MG/0.6ML injection   Subcutaneous   Inject 60 mg into the skin daily.         Marland Kitchen zolpidem (AMBIEN) 5 MG tablet   Oral   Take 1 tablet (5 mg total) by mouth at bedtime as needed for sleep.   7 tablet   0     BP 106/61  Pulse 100  Temp(Src) 99.3 F (37.4 C) (Oral)  Resp 18  SpO2 100%  Vital signs normal except borderline fever and tachycardia   Physical Exam  Nursing note and vitals reviewed. Constitutional: She appears well-developed and well-nourished.  Non-toxic appearance. She does not  appear ill. No distress.  Patient laying on her side, she does not follow commands. When I raise her arm above her head she protects her face. Patient does not respond verbally to any questions.  HENT:  Head: Normocephalic and atraumatic.  Right Ear: External ear normal.  Left Ear: External ear normal.  Nose: Nose normal. No mucosal edema or rhinorrhea.  Mouth/Throat: Mucous membranes are normal. No dental abscesses or edematous.  Tongue slightly dry  Eyes: Conjunctivae and EOM are normal. Pupils are equal, round, and reactive to light.  Neck: Normal range of motion and full passive range of motion without pain. Neck supple.  Cardiovascular: Normal rate, regular rhythm and normal heart sounds.  Exam reveals no gallop and no friction rub.   No murmur heard. Pulmonary/Chest: Effort normal and breath sounds normal. No respiratory distress. She has no wheezes. She has no rhonchi. She has no rales. She exhibits no tenderness and no crepitus.  Abdominal: Soft. Normal appearance and bowel sounds are normal. She exhibits no distension. There is no tenderness. There is no rebound and no guarding.  Musculoskeletal: Normal range of motion. She exhibits no edema and no tenderness.  Moves all extremities well.   Neurological: She has normal strength. No cranial nerve deficit.  Patient is not cooperative for neurological exam  Skin: Skin is warm, dry and intact. No rash noted. No erythema. No pallor.  Psychiatric: Her speech is normal. Her mood appears not anxious.  Patient is displaying avoidance behavior    ED Course  Procedures (including critical care time)  Medications  0.9 %  sodium chloride infusion (0 mLs Intravenous Stopped 09/18/12 1732)    Followed by  0.9 %  sodium chloride infusion (0 mLs Intravenous Stopped 09/18/12 1848)    Followed by  0.9 %  sodium chloride infusion (1,000 mLs Intravenous New Bag/Given 09/18/12 1848)  ondansetron (ZOFRAN) injection 4 mg (4 mg Intravenous Given  09/18/12 1806)  promethazine (PHENERGAN) injection 12.5 mg (12.5 mg Intravenous Given 09/18/12 1849)  promethazine (PHENERGAN) injection 12.5 mg (12.5 mg Intravenous Given 09/18/12 2227)   Recheck 16:30 discussed with brother, labs pending. Have reviewed [facility] records,  pt missed appointments in Nov and Dec and no visits since then. Pt had vincristine ordered on 12/31 and she missed her appt on 1/7.   Nurses report patient not talking to them, but has asked brother to help her walk to the bathroom.    Recheck 17:40, pt still laying on her side, does not answer when talked to, boyfriend states she has talked to him.  17:57 pt room empty has walked to bathroom.  At 1845 patient started talking and stating that it didn't help her nausea better than the Zofran. She was switched to Phenergan for her nausea.  Patient was seen several times with her fianc in a wheelchair going to the bathroom for urination. At this point it was felt she was hydrated.  At 23:15 PM patient stated she did not want anymore nausea medicine and she was feeling better. However she did not want to leave because she was afraid she would have vomiting at home. She was advised she be given medications to take at home.  Results for orders placed during the hospital encounter of 09/18/12  CBC WITH DIFFERENTIAL      Result Value Range   WBC 8.7  4.0 - 10.5 K/uL   RBC 3.96  3.87 - 5.11 MIL/uL   Hemoglobin 12.6  12.0 - 15.0 g/dL   HCT 16.1 (*) 09.6 - 04.5 %   MCV 89.9  78.0 - 100.0 fL   MCH 31.8  26.0 - 34.0 pg   MCHC 35.4  30.0 - 36.0 g/dL   RDW 40.9  81.1 - 91.4 %   Platelets 211  150 - 400 K/uL   Neutrophils Relative 94 (*) 43 - 77 %   Neutro Abs 8.2 (*) 1.7 - 7.7 K/uL   Lymphocytes Relative 4 (*) 12 - 46 %   Lymphs Abs 0.3 (*) 0.7 - 4.0 K/uL   Monocytes Relative 2 (*) 3 - 12 %   Monocytes Absolute 0.2  0.1 - 1.0 K/uL   Eosinophils Relative 0  0 - 5 %   Eosinophils Absolute 0.0  0.0 - 0.7 K/uL   Basophils Relative  0  0 - 1 %   Basophils Absolute 0.0  0.0 - 0.1 K/uL  COMPREHENSIVE METABOLIC PANEL      Result Value Range   Sodium 139  135 - 145 mEq/L   Potassium 3.5  3.5 - 5.1 mEq/L   Chloride 104  96 - 112 mEq/L   CO2 23  19 - 32 mEq/L   Glucose, Bld 134 (*) 70 - 99 mg/dL   BUN 11  6 - 23 mg/dL   Creatinine, Ser 7.82  0.50 - 1.10 mg/dL   Calcium 9.3  8.4 - 95.6 mg/dL   Total Protein 7.3  6.0 - 8.3 g/dL   Albumin 4.1  3.5 - 5.2 g/dL   AST 31  0 - 37 U/L   ALT 38 (*) 0 - 35 U/L   Alkaline Phosphatase 51  39 - 117 U/L   Total Bilirubin 0.4  0.3 - 1.2 mg/dL   GFR calc non Af Amer >90  >90 mL/min   GFR calc Af Amer >90  >90 mL/min  URINALYSIS, ROUTINE W REFLEX MICROSCOPIC      Result Value Range   Color, Urine YELLOW  YELLOW   APPearance CLEAR  CLEAR   Specific Gravity, Urine 1.029  1.005 - 1.030   pH 7.0  5.0 - 8.0   Glucose, UA NEGATIVE  NEGATIVE mg/dL  Hgb urine dipstick NEGATIVE  NEGATIVE   Bilirubin Urine NEGATIVE  NEGATIVE   Ketones, ur NEGATIVE  NEGATIVE mg/dL   Protein, ur NEGATIVE  NEGATIVE mg/dL   Urobilinogen, UA 1.0  0.0 - 1.0 mg/dL   Nitrite NEGATIVE  NEGATIVE   Leukocytes, UA NEGATIVE  NEGATIVE   Laboratory interpretation all normal   Dg Chest Portable 1 View  09/18/2012  *RADIOLOGY REPORT*  Clinical Data: Nausea, vomiting, mental status changes, weakness and history of leukemia.  PORTABLE CHEST - 1 VIEW  Comparison: 02/17/2012  Findings: Dual lumen port shows stable positioning.  The lungs are clear and without infiltrates, nodules, edema or visible pleural effusions.  Heart size is within normal limits.  Mediastinal and hilar contours are unremarkable.  Bony structures are within normal limits.  IMPRESSION: No active disease.   Original Report Authenticated By: Irish Lack, M.D.      1. Nausea and vomiting   2. Dehydration     New Prescriptions   PROMETHAZINE (PHENERGAN) 25 MG SUPPOSITORY    Place 1 suppository (25 mg total) rectally every 6 (six) hours as needed  for nausea.   PROMETHAZINE (PHENERGAN) 25 MG TABLET    Take 1 tablet (25 mg total) by mouth every 6 (six) hours as needed for nausea.    Plan discharge  Devoria Albe, MD, FACEP   MDM          Ward Givens, MD 09/18/12 [phone]

## 2012-09-18 NOTE — ED Notes (Signed)
Patient not answering questions, but VS stable. Patient's brother states he received a phone call from patient's friend stating that the patient has been vomiting since this AM. Patient had a friend with her and called the family. Patient would not talk. Patient's brother states that she vomited clear fluid this AM. Patient's brother also reports that the patient has not had her chemo monthly since December. Patient's brother atempted to call the patient's boyfriend whom the patient resides with, but no answer. Patient required 2 person assist when transferring from the wheelchair to the bed.

## 2012-09-19 LAB — URINE CULTURE: Colony Count: NO GROWTH

## 2012-09-24 LAB — CULTURE, BLOOD (ROUTINE X 2)

## 2013-01-30 ENCOUNTER — Telehealth: Payer: Self-pay | Admitting: Oncology

## 2013-01-30 ENCOUNTER — Other Ambulatory Visit: Payer: Self-pay | Admitting: Oncology

## 2013-01-30 DIAGNOSIS — J9859 Other diseases of mediastinum, not elsewhere classified: Secondary | ICD-10-CM

## 2013-01-30 NOTE — Telephone Encounter (Signed)
, °

## 2013-06-29 ENCOUNTER — Encounter (HOSPITAL_COMMUNITY): Payer: Self-pay | Admitting: Emergency Medicine

## 2013-06-29 ENCOUNTER — Emergency Department (HOSPITAL_COMMUNITY)
Admission: EM | Admit: 2013-06-29 | Discharge: 2013-06-29 | Disposition: A | Discharge status: Home / Self Care | Payer: Medicaid Other | Attending: Emergency Medicine | Admitting: Emergency Medicine

## 2013-06-29 DIAGNOSIS — D649 Anemia, unspecified: Secondary | ICD-10-CM | POA: Insufficient documentation

## 2013-06-29 DIAGNOSIS — Z7901 Long term (current) use of anticoagulants: Secondary | ICD-10-CM | POA: Insufficient documentation

## 2013-06-29 DIAGNOSIS — R5383 Other fatigue: Secondary | ICD-10-CM

## 2013-06-29 DIAGNOSIS — R5381 Other malaise: Secondary | ICD-10-CM | POA: Insufficient documentation

## 2013-06-29 DIAGNOSIS — Z87440 Personal history of urinary (tract) infections: Secondary | ICD-10-CM | POA: Insufficient documentation

## 2013-06-29 DIAGNOSIS — Z79899 Other long term (current) drug therapy: Secondary | ICD-10-CM | POA: Insufficient documentation

## 2013-06-29 DIAGNOSIS — F411 Generalized anxiety disorder: Secondary | ICD-10-CM | POA: Insufficient documentation

## 2013-06-29 DIAGNOSIS — Z856 Personal history of leukemia: Secondary | ICD-10-CM | POA: Insufficient documentation

## 2013-06-29 DIAGNOSIS — Z8619 Personal history of other infectious and parasitic diseases: Secondary | ICD-10-CM | POA: Insufficient documentation

## 2013-06-29 DIAGNOSIS — Z792 Long term (current) use of antibiotics: Secondary | ICD-10-CM | POA: Insufficient documentation

## 2013-06-29 DIAGNOSIS — R112 Nausea with vomiting, unspecified: Secondary | ICD-10-CM

## 2013-06-29 DIAGNOSIS — Z3202 Encounter for pregnancy test, result negative: Secondary | ICD-10-CM | POA: Insufficient documentation

## 2013-06-29 LAB — URINALYSIS, ROUTINE W REFLEX MICROSCOPIC
Bilirubin Urine: NEGATIVE
Glucose, UA: NEGATIVE mg/dL
Ketones, ur: NEGATIVE mg/dL
Nitrite: NEGATIVE
Protein, ur: 30 mg/dL — AB
Specific Gravity, Urine: 1.026 (ref 1.005–1.030)
Urobilinogen, UA: 1 mg/dL (ref 0.0–1.0)
pH: 7 (ref 5.0–8.0)

## 2013-06-29 LAB — CBC WITH DIFFERENTIAL/PLATELET
Basophils Absolute: 0 10*3/uL (ref 0.0–0.1)
Basophils Relative: 0 % (ref 0–1)
Eosinophils Absolute: 0 10*3/uL (ref 0.0–0.7)
Eosinophils Relative: 0 % (ref 0–5)
HCT: 36.9 % (ref 36.0–46.0)
Hemoglobin: 13.2 g/dL (ref 12.0–15.0)
Lymphocytes Relative: 11 % — ABNORMAL LOW (ref 12–46)
Lymphs Abs: 0.8 10*3/uL (ref 0.7–4.0)
MCH: 32.4 pg (ref 26.0–34.0)
MCHC: 35.8 g/dL (ref 30.0–36.0)
MCV: 90.7 fL (ref 78.0–100.0)
Monocytes Absolute: 0.3 10*3/uL (ref 0.1–1.0)
Monocytes Relative: 4 % (ref 3–12)
Neutro Abs: 6.4 10*3/uL (ref 1.7–7.7)
Neutrophils Relative %: 85 % — ABNORMAL HIGH (ref 43–77)
Platelets: 183 10*3/uL (ref 150–400)
RBC: 4.07 MIL/uL (ref 3.87–5.11)
RDW: 12.7 % (ref 11.5–15.5)
WBC: 7.5 10*3/uL (ref 4.0–10.5)

## 2013-06-29 LAB — BASIC METABOLIC PANEL
BUN: 8 mg/dL (ref 6–23)
CO2: 22 mEq/L (ref 19–32)
Calcium: 9 mg/dL (ref 8.4–10.5)
Chloride: 103 mEq/L (ref 96–112)
Creatinine, Ser: 0.67 mg/dL (ref 0.50–1.10)
GFR calc Af Amer: >90 mL/min (ref >90)
GFR calc non Af Amer: >90 mL/min (ref >90)
Glucose, Bld: 119 mg/dL — ABNORMAL HIGH (ref 70–99)
Potassium: 3.6 mEq/L — ABNORMAL LOW (ref 3.7–5.3)
Sodium: 139 mEq/L (ref 137–147)

## 2013-06-29 LAB — LIPASE, BLOOD: Lipase: 15 U/L (ref 11–59)

## 2013-06-29 LAB — URINE MICROSCOPIC-ADD ON

## 2013-06-29 LAB — PREGNANCY, URINE: Preg Test, Ur: NEGATIVE

## 2013-06-29 MED ORDER — ONDANSETRON HCL 4 MG PO TABS: 4.0000 mg | ORAL_TABLET | Freq: Four times a day (QID) | ORAL | Status: DC

## 2013-06-29 MED ORDER — ONDANSETRON HCL 4 MG/2ML IJ SOLN
4.0000 mg | Freq: Once | INTRAMUSCULAR | Status: AC
  Administered 2013-06-29: 4 mg via INTRAVENOUS
  Filled 2013-06-29: qty 2

## 2013-06-29 MED ORDER — MORPHINE SULFATE 4 MG/ML IJ SOLN
4.0000 mg | Freq: Once | INTRAMUSCULAR | Status: AC
  Administered 2013-06-29: 4 mg via INTRAVENOUS
  Filled 2013-06-29: qty 1

## 2013-06-29 MED ORDER — KETOROLAC TROMETHAMINE 15 MG/ML IJ SOLN
15.0000 mg | Freq: Once | INTRAMUSCULAR | Status: AC
  Administered 2013-06-29: 15 mg via INTRAVENOUS
  Filled 2013-06-29: qty 1

## 2013-06-29 MED ORDER — SODIUM CHLORIDE 0.9 % IV BOLUS (SEPSIS)
1000.0000 mL | Freq: Once | INTRAVENOUS | Status: AC
  Administered 2013-06-29: 1000 mL via INTRAVENOUS

## 2013-06-29 NOTE — ED Notes (Signed)
Patient refused to urinate

## 2013-06-29 NOTE — ED Provider Notes (Signed)
CSN: [id number]     Arrival date & time 06/29/13  1251 History   First MD Initiated Contact with Patient 06/29/13 1325     Chief Complaint  Patient presents with  . Emesis  . Fatigue   (Consider location/radiation/quality/duration/timing/severity/associated sxs/prior Treatment) HPI  28 year old female with nausea and vomiting. Generalized weakness. Symptom onset around 5:00 this morning. Multiple episodes of emesis. No abdominal pain. No fevers or chills. No urinary complaints. No diarrhea. No sick contacts. Was in her usual state of health yesterday. No intervention prior to arrival. Past history significant for ALL with treatment at [facility].  Past Medical History  Diagnosis Date  . Urinary tract infection   . Anxiety 2007    SHORT COURSE OF MEDS  . Anemia     TAKING FE  . Infection     UIT X 1  . Infection     TRICH X 1  . Infection     YEAST X1  . 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  . Leukemia    Past Surgical History  Procedure Laterality Date  . Cholecystectomy      age 20  . Choley    . Cesarean section  11/20/2011    Procedure: CESAREAN SECTION;  Surgeon: Delice Lesch, MD;  Location: [facility];  Service: Gynecology;  Laterality: N/A;  Primary Cesarean Section Delivery Baby Girl @ 7   Family History  Problem Relation Age of Onset  . Anesthesia problems Neg Hx   . Alcohol abuse Mother   . Lupus Brother    History  Substance Use Topics  . Smoking status: Never Smoker   . Smokeless tobacco: Never Used  . Alcohol Use: No   OB History   Grav Para Term Preterm Abortions TAB SAB Ect Mult Living   6 4 3 1 2  0 2 0 0 3     Review of Systems  All systems reviewed and negative, other than as noted in HPI.   Allergies  Review of patient's allergies indicates no known allergies.  Home Medications   Current Outpatient Rx  Name  Route  Sig  Dispense  Refill  . acyclovir (ZOVIRAX) 400 MG tablet   Oral   Take 400 mg by mouth 2  (two) times daily.         Marland Kitchen enoxaparin (LOVENOX) 60 MG/0.6ML injection   Subcutaneous   Inject 60 mg into the skin daily.         . fluconazole (DIFLUCAN) 200 MG tablet   Oral   Take 200 mg by mouth daily.         Marland Kitchen levofloxacin (LEVAQUIN) 750 MG tablet   Oral   Take 750 mg by mouth daily.         Marland Kitchen lidocaine-prilocaine (EMLA) cream   Topical   Apply 1 application topically as needed (1 to 2 hours prior to port access.).         Marland Kitchen medroxyPROGESTERone (DEPO-PROVERA) 150 MG/ML injection   Intramuscular   Inject 150 mg into the muscle every 3 (three) months.         . pentamidine (PENTAM) 300 MG inhalation solution   Inhalation   Inhale 300 mg into the lungs every 30 (thirty) days. For 11 doses.         Marland Kitchen PRESCRIPTION MEDICATION   Oral   Take 30 mLs by mouth 4 (four) times daily. Diphenhydramine "Mucositis Mouthwash" suspension.         Marland Kitchen  promethazine (PHENERGAN) 25 MG suppository   Rectal   Place 1 suppository (25 mg total) rectally every 6 (six) hours as needed for nausea.   12 each   0   . promethazine (PHENERGAN) 25 MG tablet   Oral   Take 1 tablet (25 mg total) by mouth every 6 (six) hours as needed for nausea.   10 tablet   0   . EXPIRED: zolpidem (AMBIEN) 5 MG tablet   Oral   Take 1 tablet (5 mg total) by mouth at bedtime as needed for sleep.   7 tablet   0    BP 110/68  Pulse 91  Temp(Src) 98.6 F (37 C) (Oral)  Resp 20  SpO2 98% Physical Exam  Nursing note and vitals reviewed. Constitutional: She appears well-developed and well-nourished. No distress.  Laying in bed with eyes closed. Appears tired, but not toxic.   HENT:  Head: Normocephalic and atraumatic.  Eyes: Conjunctivae are normal. Right eye exhibits no discharge. Left eye exhibits no discharge.  Neck: Neck supple.  Cardiovascular: Normal rate, regular rhythm and normal heart sounds.  Exam reveals no gallop and no friction rub.   No murmur heard. Pulmonary/Chest: Effort  normal and breath sounds normal. No respiratory distress.  Abdominal: Soft. She exhibits no distension. There is no tenderness.  Genitourinary:  No cva tenderness  Musculoskeletal: She exhibits no edema and no tenderness.  Neurological: She is alert.  Skin: Skin is warm and dry. She is not diaphoretic.  Psychiatric: She has a normal mood and affect. Her behavior is normal. Thought content normal.    ED Course  Procedures (including critical care time) Labs Review Labs Reviewed  URINALYSIS, ROUTINE W REFLEX MICROSCOPIC - Abnormal; Notable for the following:    APPearance CLOUDY (*)    Hgb urine dipstick MODERATE (*)    Protein, ur 30 (*)    Leukocytes, UA SMALL (*)    All other components within normal limits  CBC WITH DIFFERENTIAL - Abnormal; Notable for the following:    Neutrophils Relative % 85 (*)    Lymphocytes Relative 11 (*)    All other components within normal limits  BASIC METABOLIC PANEL - Abnormal; Notable for the following:    Potassium 3.6 (*)    Glucose, Bld 119 (*)    All other components within normal limits  URINE MICROSCOPIC-ADD ON - Abnormal; Notable for the following:    Squamous Epithelial / LPF FEW (*)    All other components within normal limits  PREGNANCY, URINE  LIPASE, BLOOD   Imaging Review No results found.  EKG Interpretation   None       MDM   1. Nausea and vomiting    28 year old female with nausea and vomiting. Her abdominal exam is benign. She has a past history of ALL. Per review of records, last chemotherapy appears to have been over 4 months ago. CBC/diff is non-concerning. Her additional lab work and urinalysis is unremarkable as well. Afebrile. HD stable. Suspect viral illness. With symptoms less than 12 hours, will tx symptomatically at this point. Return precautions discussed.     Virgel Manifold, MD 06/29/13 [phone]

## 2013-06-29 NOTE — ED Notes (Addendum)
Pt states she has been having emesis and weakness since 5am. Family member states pt has thrown up 10x. Pt drowsy

## 2013-06-29 NOTE — Discharge Instructions (Signed)
Nausea and Vomiting Nausea is a sick feeling that often comes before throwing up (vomiting). Vomiting is a reflex where stomach contents come out of your mouth. Vomiting can cause severe loss of body fluids (dehydration). Children and elderly adults can become dehydrated quickly, especially if they also have diarrhea. Nausea and vomiting are symptoms of a condition or disease. It is important to find the cause of your symptoms. CAUSES   Direct irritation of the stomach lining. This irritation can result from increased acid production (gastroesophageal reflux disease), infection, food poisoning, taking certain medicines (such as nonsteroidal anti-inflammatory drugs), alcohol use, or tobacco use.  Signals from the brain.These signals could be caused by a headache, heat exposure, an inner ear disturbance, increased pressure in the brain from injury, infection, a tumor, or a concussion, pain, emotional stimulus, or metabolic problems.  An obstruction in the gastrointestinal tract (bowel obstruction).  Illnesses such as diabetes, hepatitis, gallbladder problems, appendicitis, kidney problems, cancer, sepsis, atypical symptoms of a heart attack, or eating disorders.  Medical treatments such as chemotherapy and radiation.  Receiving medicine that makes you sleep (general anesthetic) during surgery. DIAGNOSIS Your caregiver may ask for tests to be done if the problems do not improve after a few days. Tests may also be done if symptoms are severe or if the reason for the nausea and vomiting is not clear. Tests may include:  Urine tests.  Blood tests.  Stool tests.  Cultures (to look for evidence of infection).  X-rays or other imaging studies. Test results can help your caregiver make decisions about treatment or the need for additional tests. TREATMENT You need to stay well hydrated. Drink frequently but in small amounts.You may wish to drink water, sports drinks, clear broth, or eat frozen  ice pops or gelatin dessert to help stay hydrated.When you eat, eating slowly may help prevent nausea.There are also some antinausea medicines that may help prevent nausea. HOME CARE INSTRUCTIONS   Take all medicine as directed by your caregiver.  If you do not have an appetite, do not force yourself to eat. However, you must continue to drink fluids.  If you have an appetite, eat a normal diet unless your caregiver tells you differently.  Eat a variety of complex carbohydrates (rice, wheat, potatoes, bread), lean meats, yogurt, fruits, and vegetables.  Avoid high-fat foods because they are more difficult to digest.  Drink enough water and fluids to keep your urine clear or pale yellow.  If you are dehydrated, ask your caregiver for specific rehydration instructions. Signs of dehydration may include:  Severe thirst.  Dry lips and mouth.  Dizziness.  Dark urine.  Decreasing urine frequency and amount.  Confusion.  Rapid breathing or pulse. SEEK IMMEDIATE MEDICAL CARE IF:   You have blood or brown flecks (like coffee grounds) in your vomit.  You have black or bloody stools.  You have a severe headache or stiff neck.  You are confused.  You have severe abdominal pain.  You have chest pain or trouble breathing.  You do not urinate at least once every 8 hours.  You develop cold or clammy skin.  You continue to vomit for longer than 24 to 48 hours.  You have a fever. MAKE SURE YOU:   Understand these instructions.  Will watch your condition.  Will get help right away if you are not doing well or get worse. Document Released: 06/12/2005 Document Revised: 09/04/2011 Document Reviewed: 11/09/2010 [facility] Patient Information 2014 [place], [place].   Emergency Department Resource  Guide 1) Find a Doctor and Pay Out of Pocket Although you won't have to find out who is covered by your insurance plan, it is a good idea to ask around and get recommendations. You will  then need to call the office and see if the doctor you have chosen will accept you as a new patient and what types of options they offer for patients who are self-pay. Some doctors offer discounts or will set up payment plans for their patients who do not have insurance, but you will need to ask so you aren't surprised when you get to your appointment.  2) Contact Your Local Health Department Not all health departments have doctors that can see patients for sick visits, but many do, so it is worth a call to see if yours does. If you don't know where your local health department is, you can check in your phone book. The CDC also has a tool to help you locate your state's health department, and many state websites also have listings of all of their local health departments.  3) Find a [facility] If your illness is not likely to be very severe or complicated, you may want to try a walk in clinic. These are popping up all over the country in pharmacies, drugstores, and shopping centers. They're usually staffed by nurse practitioners or physician assistants that have been trained to treat common illnesses and complaints. They're usually fairly quick and inexpensive. However, if you have serious medical issues or chronic medical problems, these are probably not your best option.  No Primary Care Doctor: - Call Health Connect at  [phone] - they can help you locate a primary care doctor that  accepts your insurance, provides certain services, etc. - Physician Referral Service- [phone]  Chronic Pain Problems: Organization         Address  Phone   Notes  [facility]  [phone] Patients need to be referred by their primary care doctor.   Medication Assistance: Organization         Address  Phone   Notes  [facility] Medication [facility] [address] [place], [place] [address] [phone] --Must be a resident of [facility] -- Must have NO  insurance coverage whatsoever (no Medicaid/ Medicare, etc.) -- The pt. MUST have a primary care doctor that directs their care regularly and follows them in the community   Med[facility]ist  [phone]   [employer]  [phone]    Agencies that provide inexpensive medical care: Organization         Address  Phone   Notes  [place]  [phone]   [facility]    [phone]   [facility] [place], [place] [address] [phone]   [place] [address] [place] [phone]   Planned Parenthood    [phone]   [facility]    [phone]   [place] and [place] [address], [place] Phone:  [phone], Fax:  [phone] Hours of Operation:  9 am - 6 pm, M-F.  Also accepts Medicaid/Medicare and self-pay.  [facility] : [facility] for [place], [address], [place] Phone: [phone], Fax: [phone]. Hours of Operation:  8:30 am - 5:30 pm, M-F.  Also accepts Medicaid and self-pay.  [facility] [place] [address],  [place] Phone: [phone]   [place], [place], [place] [phone], Ext. [phone] Mondays & Thursdays: 7-9 AM.  First 15 patients are seen on a first come, first serve basis.    [place] Providers:  Organization         Address  Phone   Notes  [facility] [address], [place] [phone] Also accepts self-pay patients.  [facility] [address], [place]  [phone]   [place], [address], [place] [phone]   [facility] [address] [place] [phone]   Lucianne Lei [address], [place]   [phone] Only accepts [place] Access [place] patients after they have  their name applied to their card.   Self-Pay (no insurance) in [facility]:  Organization         Address  Phone   Notes  Sickle Cell Patients, [facility] [place] [phone]   [facility] [place] [phone]   [facility] [place]  [place], [place], [place] [phone]   [facility]/Dr. Osei-Bonsu  [address] [place] or [address], [place] [phone] Phone number for both [place] and [place] locations is the same.  Urgent Medical and [facility] [address] [place] [phone]   [facility] [address] [place] or [address] [phone]   [facility] [address] [place] [phone], phone; [phone], fax Sees patients 1st and 3rd Saturday of every month.  Must not qualify for public or private insurance (i.e. Medicaid, Medicare, [place] Health Choice, Veterans' Benefits)  Household income should be no more than 200% of the poverty level The clinic cannot treat you if you are pregnant or think you are pregnant  Sexually transmitted diseases are not treated at the clinic.    Dental Care: Organization         Address  Phone  Notes  [facility] Department of [facility] [place] [phone] Accepts children up to age 15 who are enrolled in [place] or [place]; pregnant women with a Medicaid card; and children who have applied for Medicaid or [place] Health Choice, but were declined, whose parents can pay a reduced fee at time of service.  [facility] Department of [facility]  [address], [place] [phone] Accepts children up to age 70 who are enrolled in [place] or [place]; pregnant women with a Medicaid card; and children who have applied  for Medicaid or [place] Health Choice, but were declined, whose parents can pay a reduced fee at time of service.  [place] Adult Dental Access PROGRAM  [place] [phone] Patients are seen by appointment only. Walk-ins are not accepted. [place] will see patients 73 years of age and older. Monday - Tuesday (8am-5pm) Most Wednesdays (8:30-5pm) $30 per visit, cash only  [facility] Adult Dental Access PROGRAM  [address], [facility] [phone] Patients are seen by appointment only. Walk-ins are not accepted. [place] will see patients 7 years of age and older. One Wednesday Evening (  Monthly: Volunteer Based).  $30 per visit, cash only  [place]  [phone] for adults; Children under age 43, call Graduate Pediatric Dentistry at [phone]. Children aged 68-14, please call [phone] to request a pediatric application.  Dental services are provided in all areas of dental care including fillings, crowns and bridges, complete and partial dentures, implants, gum treatment, root canals, and extractions. Preventive care is also provided. Treatment is provided to both adults and children. Patients are selected via a lottery and there is often a waiting list.   [facility] [address] [place]  [phone] [email or web address]   [facility] [address] [place], [place] [phone], Ext. [phone] Second and Fourth Thursday of each month, opens at 6:30 AM; Clinic ends at 9 AM.  Patients are seen on a first-come first-served basis, and a limited number are seen during each clinic.   [facility]  [address] Hillard Danker [place], [place] [phone]   Eligibility Requirements You must have lived in [place], [place], or [place] counties for at least the last three months.   You cannot be eligible for state or federal sponsored [employer], including [employer],  [place], or [employer].   You generally cannot be eligible for healthcare insurance through your employer.    How to apply: Eligibility screenings are held every Tuesday and Wednesday afternoon from 1:00 pm until 4:00 pm. You do not need an appointment for the interview!  [place] [address] [place], [place]   [place]  [place] Department  [place]  [phone]    Behavioral Health Resources in the Community: Intensive Outpatient Programs Organization         Address  Phone  Notes  [place]. [address], [place], [place] [phone]   [facility] Outpatient [address] [place], [place]   ADS: Alcohol & Drug Svcs [address], [place], [place]   [place] [address],  [place], [place] or [phone]   Substance Abuse Resources Organization         Address  Phone  Notes  Alcohol and Drug Services  [phone]   [place]  [phone]   [place]   Chinita Pester  [phone]   Residential & Outpatient Substance Abuse Program  [phone]   Psychological Services Organization         Address  Phone  Notes  [facility] [place]  [place]  [phone]   [place] [address] [place] or [phone]    Mobile Crisis Teams Organization         Address  Phone  Notes  [facility], Mobile Crisis Care Unit  [phone]   Assertive Psychotherapeutic Services  [address] [place], [place]   [place] [address], [place] [phone]    Self-Help/Support Groups Organization         Address  Phone             Notes  [place]. of [place] - variety of support  groups  [place] Call for more information  Narcotics Anonymous (NA), [facility] [address], [employer] [place]  2 meetings at this location  Residential Treatment Programs Organization         Address  Phone  Notes  [facility] [address]    [place]  [phone]   [facility]  [address] [place] [id number], [place], [place]   [place], [place] [phone] Admissions: 8am-3pm M-F  Incentives Substance [place] [address],    [place], [place] [phone]   [facility] [address] [place], [place], [place]   [facility] [address]  [place], [place]   [facility] Programs - Intensive Outpatient [address] Kristeen [address], [place], [place]   [facility] [address]  [place], [place] [phone] or [phone]   [facility]idential Treatment Services (RTS) [address] [place], [place] Accepts Medicaid  [facility] [place] [address]  [place] [phone] Substance Abuse/Addiction Treatment   [facility] Organization         Address  Phone  Notes  [facility]  [phone]   Domenic Schwab, PhD [address] Arlis [place], [place]   [phone] or [phone]   [place], [place] [phone]   [facility] [address] [place], [place] [phone] Insurance/Medicaid/sponsorship through [facility] and Families [address], [address] [place]                                    [place], [place] [phone] [place] [address], [place] [phone]    Dr. Adele Schilder  [phone]   [facility] [place] Dept. 1) [address] [place] 2) [place] 3)   [place] [address], [place] [phone]  [phone]   [place] [phone] or [phone] (After Hours)

## 2013-12-25 ENCOUNTER — Emergency Department (HOSPITAL_COMMUNITY): Payer: Medicaid Other

## 2013-12-25 ENCOUNTER — Inpatient Hospital Stay (HOSPITAL_COMMUNITY)
Admission: EM | Admit: 2013-12-25 | Discharge: 2013-12-29 | DRG: 690 | Disposition: A | Discharge status: Home / Self Care | Payer: Medicaid Other | Attending: Internal Medicine | Admitting: Internal Medicine

## 2013-12-25 ENCOUNTER — Encounter (HOSPITAL_COMMUNITY): Payer: Self-pay | Admitting: Emergency Medicine

## 2013-12-25 DIAGNOSIS — C9101 Acute lymphoblastic leukemia, in remission: Secondary | ICD-10-CM | POA: Diagnosis present

## 2013-12-25 DIAGNOSIS — R112 Nausea with vomiting, unspecified: Secondary | ICD-10-CM | POA: Diagnosis present

## 2013-12-25 DIAGNOSIS — F411 Generalized anxiety disorder: Secondary | ICD-10-CM | POA: Diagnosis present

## 2013-12-25 DIAGNOSIS — Z856 Personal history of leukemia: Secondary | ICD-10-CM

## 2013-12-25 DIAGNOSIS — N390 Urinary tract infection, site not specified: Principal | ICD-10-CM | POA: Diagnosis present

## 2013-12-25 DIAGNOSIS — R1115 Cyclical vomiting syndrome unrelated to migraine: Secondary | ICD-10-CM | POA: Diagnosis present

## 2013-12-25 DIAGNOSIS — F1210 Cannabis abuse, uncomplicated: Secondary | ICD-10-CM | POA: Diagnosis present

## 2013-12-25 DIAGNOSIS — E860 Dehydration: Secondary | ICD-10-CM | POA: Diagnosis present

## 2013-12-25 DIAGNOSIS — F3289 Other specified depressive episodes: Secondary | ICD-10-CM | POA: Diagnosis present

## 2013-12-25 DIAGNOSIS — F329 Major depressive disorder, single episode, unspecified: Secondary | ICD-10-CM | POA: Diagnosis present

## 2013-12-25 DIAGNOSIS — R1110 Vomiting, unspecified: Secondary | ICD-10-CM

## 2013-12-25 DIAGNOSIS — IMO0002 Reserved for concepts with insufficient information to code with codable children: Secondary | ICD-10-CM

## 2013-12-25 LAB — URINALYSIS, ROUTINE W REFLEX MICROSCOPIC
Bilirubin Urine: NEGATIVE
GLUCOSE, UA: NEGATIVE mg/dL
Nitrite: NEGATIVE
PROTEIN: 100 mg/dL — AB
Specific Gravity, Urine: 1.019 (ref 1.005–1.030)
UROBILINOGEN UA: 1 mg/dL (ref 0.0–1.0)
pH: 7 (ref 5.0–8.0)

## 2013-12-25 LAB — COMPREHENSIVE METABOLIC PANEL
ALT: 45 U/L — ABNORMAL HIGH (ref 0–35)
AST: 45 U/L — ABNORMAL HIGH (ref 0–37)
Albumin: 4.3 g/dL (ref 3.5–5.2)
Alkaline Phosphatase: 50 U/L (ref 39–117)
Anion gap: 14 (ref 5–15)
BUN: 11 mg/dL (ref 6–23)
CO2: 23 mEq/L (ref 19–32)
Calcium: 9.4 mg/dL (ref 8.4–10.5)
Chloride: 104 mEq/L (ref 96–112)
Creatinine, Ser: 0.73 mg/dL (ref 0.50–1.10)
GFR calc Af Amer: >90 mL/min (ref >90)
GFR calc non Af Amer: >90 mL/min (ref >90)
Glucose, Bld: 119 mg/dL — ABNORMAL HIGH (ref 70–99)
Potassium: 3.8 mEq/L (ref 3.7–5.3)
Sodium: 141 mEq/L (ref 137–147)
Total Bilirubin: 0.4 mg/dL (ref 0.3–1.2)
Total Protein: 7.7 g/dL (ref 6.0–8.3)

## 2013-12-25 LAB — CBC WITH DIFFERENTIAL/PLATELET
Basophils Absolute: 0 10*3/uL (ref 0.0–0.1)
Basophils Relative: 0 % (ref 0–1)
Eosinophils Absolute: 0 10*3/uL (ref 0.0–0.7)
Eosinophils Relative: 0 % (ref 0–5)
HCT: 35.5 % — ABNORMAL LOW (ref 36.0–46.0)
Hemoglobin: 12.1 g/dL (ref 12.0–15.0)
Lymphocytes Relative: 12 % (ref 12–46)
Lymphs Abs: 0.9 10*3/uL (ref 0.7–4.0)
MCH: 31.3 pg (ref 26.0–34.0)
MCHC: 34.1 g/dL (ref 30.0–36.0)
MCV: 91.7 fL (ref 78.0–100.0)
Monocytes Absolute: 0.3 10*3/uL (ref 0.1–1.0)
Monocytes Relative: 4 % (ref 3–12)
Neutro Abs: 6.2 10*3/uL (ref 1.7–7.7)
Neutrophils Relative %: 84 % — ABNORMAL HIGH (ref 43–77)
Platelets: 259 10*3/uL (ref 150–400)
RBC: 3.87 MIL/uL (ref 3.87–5.11)
RDW: 12.5 % (ref 11.5–15.5)
WBC: 7.4 10*3/uL (ref 4.0–10.5)

## 2013-12-25 LAB — TROPONIN I: Troponin I: <0.3 ng/mL (ref <0.30)

## 2013-12-25 LAB — POC URINE PREG, ED: Preg Test, Ur: NEGATIVE

## 2013-12-25 LAB — URINE MICROSCOPIC-ADD ON

## 2013-12-25 LAB — I-STAT CG4 LACTIC ACID, ED: Lactic Acid, Venous: 1.5 mmol/L (ref 0.5–2.2)

## 2013-12-25 LAB — LIPASE, BLOOD: Lipase: 16 U/L (ref 11–59)

## 2013-12-25 MED ORDER — SODIUM CHLORIDE 0.9 % IV BOLUS (SEPSIS)
1000.0000 mL | Freq: Once | INTRAVENOUS | Status: AC
  Administered 2013-12-25: 1000 mL via INTRAVENOUS

## 2013-12-25 MED ORDER — SODIUM CHLORIDE 0.9 % IV BOLUS (SEPSIS): 500.0000 mL | Freq: Once | INTRAVENOUS | Status: DC

## 2013-12-25 MED ORDER — PROMETHAZINE HCL 25 MG/ML IJ SOLN
12.5000 mg | Freq: Once | INTRAMUSCULAR | Status: AC
  Administered 2013-12-25: 12.5 mg via INTRAVENOUS
  Filled 2013-12-25: qty 1

## 2013-12-25 MED ORDER — ONDANSETRON HCL 4 MG/2ML IJ SOLN
4.0000 mg | Freq: Once | INTRAMUSCULAR | Status: AC
  Administered 2013-12-25: 4 mg via INTRAVENOUS
  Filled 2013-12-25: qty 2

## 2013-12-25 MED ORDER — ONDANSETRON HCL 4 MG/2ML IJ SOLN: 4.0000 mg | Freq: Once | INTRAMUSCULAR | Status: DC

## 2013-12-25 NOTE — ED Notes (Signed)
Pt. Made aware for the need of urine. 

## 2013-12-25 NOTE — Progress Notes (Signed)
  CARE MANAGEMENT ED NOTE 12/25/2013  Patient:  Carla Haas, Carla Haas   Account Number:  [id number]  Date Initiated:  12/25/2013  Documentation initiated by:  Livia Snellen  Subjective/Objective Assessment:   Patient presents to Ed with nausea and vomitting     Subjective/Objective Assessment Detail:     Action/Plan:   Action/Plan Detail:   Anticipated DC Date:       Status Recommendation to Physician:   Result of Recommendation:    Other ED [place]  Other  PCP issues    Choice offered to / List presented to:            Status of service:  Completed, signed off  ED Comments:   ED Comments Detail:  EDCM spoke to patient's boyfriend at bedside.  Patient too lethargic to speak to [facility].  Patient's boyfriend unable to tell [facility] if patient had a pcp.  EDCM left list of pcps who accept Medicaid in [place] at bedside for patient. No further EDCM needs at this time.

## 2013-12-25 NOTE — ED Notes (Signed)
Pt presented by EMS from home, report of on-going/constant nausea and vomiting,denies diarrhea mention of blood tinge with recent episode of emesis, pt on assessment is lethargic, poor historian, took benadryl OTC.

## 2013-12-25 NOTE — ED Provider Notes (Signed)
CSN: [id number]     Arrival date & time 12/25/13  1859 History   First MD Initiated Contact with Patient 12/25/13 1859     Chief Complaint  Patient presents with  . Abdominal Pain  . Nausea  . Emesis     (Consider location/radiation/quality/duration/timing/severity/associated sxs/prior Treatment) The history is provided by the patient. No language interpreter was used.  Carla Haas is a 28 y/o F with PMHx of Leukemia, UTI, anxiety, yeast and trichomonas infection presenting to the ED with nausea and vomiting for the past 4-5 days. Reported that she has been unable to keep any food or fluids down. Reported that she has not been able to eat for the past 5 days - reported that anything she places in her mouth comes right back up. Stated that she has been having mild diarrhea - loose stools. Denied chest pain, shortness of breath, difficulty breathing, neck pain, back pain, abdominal pain, melena, hematochezia.  PCP none Oncology Dr. Beryle Beams  Past Medical History  Diagnosis Date  . Urinary tract infection   . Anxiety 2007    SHORT COURSE OF MEDS  . Anemia     TAKING FE  . Infection     UIT X 1  . Infection     TRICH X 1  . Infection     YEAST X1  . 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  . Leukemia    Past Surgical History  Procedure Laterality Date  . Cholecystectomy      age 28  . Choley    . Cesarean section  11/20/2011    Procedure: CESAREAN SECTION;  Surgeon: Delice Lesch, MD;  Location: [facility];  Service: Gynecology;  Laterality: N/A;  Primary Cesarean Section Delivery Baby Girl @ 81   Family History  Problem Relation Age of Onset  . Anesthesia problems Neg Hx   . Alcohol abuse Mother   . Lupus Brother    History  Substance Use Topics  . Smoking status: Never Smoker   . Smokeless tobacco: Never Used  . Alcohol Use: No   OB History   Grav Para Term Preterm Abortions TAB SAB Ect Mult Living   6 4 3 1 2  0 2 0 0 3     Review of  Systems  Constitutional: Positive for fatigue. Negative for fever and chills.  Respiratory: Negative for chest tightness and shortness of breath.   Cardiovascular: Negative for chest pain.  Gastrointestinal: Positive for nausea, vomiting and diarrhea. Negative for abdominal pain, constipation, blood in stool and anal bleeding.  Genitourinary: Positive for decreased urine volume. Negative for dysuria, hematuria and pelvic pain.  Musculoskeletal: Negative for back pain and neck pain.  Neurological: Positive for weakness.      Allergies  Review of patient's allergies indicates no known allergies.  Home Medications   Prior to Admission medications   Medication Sig Start Date End Date Taking? Authorizing Provider  zolpidem (AMBIEN) 5 MG tablet Take 1 tablet (5 mg total) by mouth at bedtime as needed for sleep. 02/17/12 02/16/13  Hoy Morn, MD   BP 106/68  Pulse 84  Temp(Src) 98.9 F (37.2 C) (Oral)  Resp 18  SpO2 100% Physical Exam  Nursing note and vitals reviewed. Constitutional: She is oriented to person, place, and time. She appears well-developed and well-nourished. No distress.  Patient appears lethargic  HENT:  Head: Normocephalic and atraumatic.  Mouth/Throat: No oropharyngeal exudate.  Dry mucus membranes  Eyes: Conjunctivae and EOM are normal. Pupils are equal, round, and reactive to light. Right eye exhibits no discharge. Left eye exhibits no discharge.  Neck: Normal range of motion. Neck supple. No tracheal deviation present.  Negative neck stiffness Negative nuchal rigidity Negative cervical lymphadenopathy  Cardiovascular: Normal rate, regular rhythm and normal heart sounds.  Exam reveals no friction rub.   No murmur heard. Cap refill < 3 seconds Negative swelling or pitting edema noted to the lower extremities bilaterally   Pulmonary/Chest: Effort normal and breath sounds normal. No respiratory distress. She has no wheezes. She has no rales.  Abdominal: Soft.  Bowel sounds are normal. She exhibits no distension. There is no tenderness. There is no rebound and no guarding.  Negative abdominal distension  BS normoactive in all 4 quadrants Abdomen soft upon palpation  Negative peritoneal signs Negative rigidity and guarding noted  Genitourinary: No vaginal discharge found.  Pelvic exam: negative swelling, erythema, inflammation, lesions, sores noted to the external genitalia. Negative swelling, erythema, inflammation,lesions, sores noted to the vaginal canal. Negative blood in the vaginal vault. Negative vaginal discharge noted. Negative CMT. Negative bilateral adnexal tenderness noted.  Exam chaperoned with Tech.   Musculoskeletal: Normal range of motion.  Full ROM to upper and lower extremities without difficulty noted, negative ataxia noted.  Lymphadenopathy:    She has no cervical adenopathy.  Neurological: She is alert and oriented to person, place, and time. No cranial nerve deficit. She exhibits normal muscle tone. Coordination normal.  Cranial nerves III-XII grossly intact Negative facial drooping Negative slurred speech  Negative aphasia  Skin: Skin is warm and dry. No rash noted. She is not diaphoretic. No erythema.  Psychiatric: She has a normal mood and affect. Her behavior is normal. Thought content normal.    ED Course  Procedures (including critical care time)  12:56 AM This provider spoke with Dr. Roswell Nickel, [facility] - discussed case, labs, imaging in great detail. Dr. Roswell Nickel to come down and assess patient.   Results for orders placed during the hospital encounter of 12/25/13  WET PREP, GENITAL      Result Value Ref Range   Yeast Wet Prep HPF POC NONE SEEN  NONE SEEN   Trich, Wet Prep NONE SEEN  NONE SEEN   Clue Cells Wet Prep HPF POC RARE (*) NONE SEEN   WBC, Wet Prep HPF POC RARE (*) NONE SEEN  CBC WITH DIFFERENTIAL      Result Value Ref Range   WBC 7.4  4.0 - 10.5 K/uL   RBC 3.87  3.87 - 5.11 MIL/uL    Hemoglobin 12.1  12.0 - 15.0 g/dL   HCT 35.5 (*) 36.0 - 46.0 %   MCV 91.7  78.0 - 100.0 fL   MCH 31.3  26.0 - 34.0 pg   MCHC 34.1  30.0 - 36.0 g/dL   RDW 12.5  11.5 - 15.5 %   Platelets 259  150 - 400 K/uL   Neutrophils Relative % 84 (*) 43 - 77 %   Neutro Abs 6.2  1.7 - 7.7 K/uL   Lymphocytes Relative 12  12 - 46 %   Lymphs Abs 0.9  0.7 - 4.0 K/uL   Monocytes Relative 4  3 - 12 %   Monocytes Absolute 0.3  0.1 - 1.0 K/uL   Eosinophils Relative 0  0 - 5 %   Eosinophils Absolute 0.0  0.0 - 0.7 K/uL   Basophils Relative 0  0 - 1 %  Basophils Absolute 0.0  0.0 - 0.1 K/uL  COMPREHENSIVE METABOLIC PANEL      Result Value Ref Range   Sodium 141  137 - 147 mEq/L   Potassium 3.8  3.7 - 5.3 mEq/L   Chloride 104  96 - 112 mEq/L   CO2 23  19 - 32 mEq/L   Glucose, Bld 119 (*) 70 - 99 mg/dL   BUN 11  6 - 23 mg/dL   Creatinine, Ser 0.73  0.50 - 1.10 mg/dL   Calcium 9.4  8.4 - 10.5 mg/dL   Total Protein 7.7  6.0 - 8.3 g/dL   Albumin 4.3  3.5 - 5.2 g/dL   AST 45 (*) 0 - 37 U/L   ALT 45 (*) 0 - 35 U/L   Alkaline Phosphatase 50  39 - 117 U/L   Total Bilirubin 0.4  0.3 - 1.2 mg/dL   GFR calc non Af Amer >90  >90 mL/min   GFR calc Af Amer >90  >90 mL/min   Anion gap 14  5 - 15  LIPASE, BLOOD      Result Value Ref Range   Lipase 16  11 - 59 U/L  URINALYSIS, ROUTINE W REFLEX MICROSCOPIC      Result Value Ref Range   Color, Urine YELLOW  YELLOW   APPearance CLOUDY (*) CLEAR   Specific Gravity, Urine 1.019  1.005 - 1.030   pH 7.0  5.0 - 8.0   Glucose, UA NEGATIVE  NEGATIVE mg/dL   Hgb urine dipstick MODERATE (*) NEGATIVE   Bilirubin Urine NEGATIVE  NEGATIVE   Ketones, ur >80 (*) NEGATIVE mg/dL   Protein, ur 100 (*) NEGATIVE mg/dL   Urobilinogen, UA 1.0  0.0 - 1.0 mg/dL   Nitrite NEGATIVE  NEGATIVE   Leukocytes, UA MODERATE (*) NEGATIVE  TROPONIN I      Result Value Ref Range   Troponin I <0.30  <0.30 ng/mL  URINE MICROSCOPIC-ADD ON      Result Value Ref Range   Squamous Epithelial /  LPF RARE  RARE   WBC, UA 7-10  <3 WBC/hpf   RBC / HPF 3-6  <3 RBC/hpf   Bacteria, UA FEW (*) RARE  URINE RAPID DRUG SCREEN (HOSP PERFORMED)      Result Value Ref Range   Opiates NONE DETECTED  NONE DETECTED   Cocaine NONE DETECTED  NONE DETECTED   Benzodiazepines NONE DETECTED  NONE DETECTED   Amphetamines NONE DETECTED  NONE DETECTED   Tetrahydrocannabinol POSITIVE (*) NONE DETECTED   Barbiturates NONE DETECTED  NONE DETECTED  POC URINE PREG, ED      Result Value Ref Range   Preg Test, Ur NEGATIVE  NEGATIVE  I-STAT CG4 LACTIC ACID, ED      Result Value Ref Range   Lactic Acid, Venous 1.50  0.5 - 2.2 mmol/L    Labs Review Labs Reviewed  WET PREP, GENITAL - Abnormal; Notable for the following:    Clue Cells Wet Prep HPF POC RARE (*)    WBC, Wet Prep HPF POC RARE (*)    All other components within normal limits  CBC WITH DIFFERENTIAL - Abnormal; Notable for the following:    HCT 35.5 (*)    Neutrophils Relative % 84 (*)    All other components within normal limits  COMPREHENSIVE METABOLIC PANEL - Abnormal; Notable for the following:    Glucose, Bld 119 (*)    AST 45 (*)    ALT 45 (*)  All other components within normal limits  URINALYSIS, ROUTINE W REFLEX MICROSCOPIC - Abnormal; Notable for the following:    APPearance CLOUDY (*)    Hgb urine dipstick MODERATE (*)    Ketones, ur >80 (*)    Protein, ur 100 (*)    Leukocytes, UA MODERATE (*)    All other components within normal limits  URINE MICROSCOPIC-ADD ON - Abnormal; Notable for the following:    Bacteria, UA FEW (*)    All other components within normal limits  URINE RAPID DRUG SCREEN (HOSP PERFORMED) - Abnormal; Notable for the following:    Tetrahydrocannabinol POSITIVE (*)    All other components within normal limits  URINE CULTURE  GC/CHLAMYDIA PROBE AMP  LIPASE, BLOOD  TROPONIN I  POC URINE PREG, ED  I-STAT CG4 LACTIC ACID, ED    Imaging Review US Abdomen Complete  12/25/2013   CLINICAL DATA:  Right  upper quadrant pain and elevated liver enzymes.  EXAM: ULTRASOUND ABDOMEN COMPLETE  COMPARISON:  CT of the chest 12/17/2011  FINDINGS: Gallbladder:  Surgically absent  Common bile duct:  Diameter: 2 mm  Liver:  In the subcapsular right hepatic lobe anteriorly there is a hyperechoic mass measuring 18 mm. There is prominent vessels leading to the mass. This correlates with hemangioma seen on chest CT 12/17/2011. No associated subcapsular hypoechoic fluid. There is no other focal liver abnormality. The liver echogenicity is within normal limits and there is antegrade flow in the imaged portal venous system.  IVC:  No abnormality visualized.  Pancreas:  Visualized portion unremarkable.  Spleen:  Size and appearance within normal limits.  Right Kidney:  Length: 11 cm. Echogenicity within normal limits. No mass or hydronephrosis visualized.  Left Kidney:  Length: 11 cm. Echogenicity within normal limits. No mass or hydronephrosis visualized. Prominent band of cortical tissue in the medullary region could reflect duplication of the urinary collecting system.  Abdominal aorta:  No aneurysm visualized.  Other findings:  None.  IMPRESSION: 1. No acute sonographic findings. The gallbladder is surgically absent. 2. 18 mm right hepatic hemangioma.   Electronically Signed   By: Jorje Guild M.D.   On: 12/25/2013 23:07   Dg Abd Acute W/chest  12/26/2013   CLINICAL DATA:  abdominal pain abdominal pain  EXAM: ACUTE ABDOMEN SERIES (ABDOMEN 2 VIEW & CHEST 1 VIEW)  COMPARISON:  Prior radiograph from 09/18/2012  FINDINGS: Right-sided Port-A-Cath in place. Cardiac and mediastinal silhouettes within normal limits.  Lungs are normally inflated. No focal infiltrate, pulmonary edema, or pleural effusion. No pneumothorax.  Visualized bowel gas pattern is within normal limits without evidence of obstruction or ileus. No abnormal bowel wall thickening. No free intraperitoneal air seen on lateral decubitus image. Surgical clips overlie the  right upper quadrant.  Tiny 2 mm calcific density overlying the right renal shadow may represent a small renal calculus or may lie within the fecal stream. a  No acute osseus abnormality.  IMPRESSION: 1. Nonobstructive bowel gas pattern with no radiographic evidence of acute intra-abdominal process. 2. 2 mm calcification overlying the region of the right kidney, which may represent a small nonobstructive calculus or may lie within the fecal stream. 3. No acute cardiopulmonary abnormality.   Electronically Signed   By: Jeannine Boga M.D.   On: 12/26/2013 00:21     EKG Interpretation None      MDM   Final diagnoses:  Intractable vomiting with nausea, vomiting of unspecified type  History of leukemia    Medications  ondansetron (  ZOFRAN) injection 4 mg (4 mg Intravenous Given 12/25/13 1936)  sodium chloride 0.9 % bolus 1,000 mL (0 mLs Intravenous Stopped 12/25/13 2000)  sodium chloride 0.9 % bolus 1,000 mL (0 mLs Intravenous Stopped 12/25/13 2215)  promethazine (PHENERGAN) injection 12.5 mg (12.5 mg Intravenous Given 12/25/13 2228)  ondansetron (ZOFRAN) injection 4 mg (4 mg Intravenous Given 12/26/13 0057)  sodium chloride 0.9 % bolus 500 mL (0 mLs Intravenous Stopped 12/26/13 0300)   Filed Vitals:   12/25/13 1900 12/25/13 2310  BP: 100/62 106/68  Pulse: 84 84  Temp: 98.4 F (36.9 C) 98.9 F (37.2 C)  TempSrc: Oral Oral  Resp: 18 18  SpO2: 100% 100%   EKG noted normal sinus rhythm with a heart rate of 75 bpm. Troponin negative elevation. CBC negative elevated white blood cell count-negative left shift or leukocytosis noted. CMP noted mildly elevated AST and ALT-AST 45, ALT 45. Negative elevated alkaline phosphatase or bilirubin. Kidneys function well. Lipase negative elevation. Lactic acid negative elevation. Urine pregnancy negative. Urinalysis noted moderate hemoglobin with leukocytes identified-white blood cells 7-10 identified in the urine. Urine culture pending. Abdominal ultrasound  negative for acute abdominal processes - gallbladder is surgically absent. 18 mm right hepatic hemangioma. Acute abdominal plain film nonobstructive bowel gas pattern with no evidence of acute intra-abdominal processes. 2 mm calcification overlying the region of the right kidney. No acute cardiopulmonary abnormalities identified. Wet prep negative for trich, clue cells and WBC.  Doubt acute abdominal processes. Doubt ectopic pregnancy. Doubt TOA. Patient has been in the ED well over 5 hours with IV fluids and antiemetics and patient continues to have nausea and vomiting - intractable emesis noted. This provider spoke with Dr. Roswell Nickel - patient to be admitted for intractable nausea and vomiting. Discussed plan for admission with patient who agreed to plan of care and understood. Patient stable for transfer.  [employer], PA-C 12/26/13 0310

## 2013-12-25 NOTE — ED Notes (Signed)
Bed: FR10 Expected date:  Expected time:  Means of arrival:  Comments: EMS-vomitting

## 2013-12-25 NOTE — ED Notes (Signed)
Pt was notified to undress from the waist down for the pelvic exam. Pt did not respond to writer's request.

## 2013-12-25 NOTE — ED Notes (Signed)
[place], PA shown CG4 Lactic results.

## 2013-12-25 NOTE — ED Notes (Signed)
PA at bedside.

## 2013-12-25 NOTE — ED Notes (Signed)
EKG given to EDP,[place],MD. For review. 

## 2013-12-26 ENCOUNTER — Emergency Department (HOSPITAL_COMMUNITY): Payer: Medicaid Other

## 2013-12-26 ENCOUNTER — Encounter (HOSPITAL_COMMUNITY): Payer: Self-pay | Admitting: Internal Medicine

## 2013-12-26 ENCOUNTER — Inpatient Hospital Stay (HOSPITAL_COMMUNITY): Payer: Medicaid Other

## 2013-12-26 DIAGNOSIS — R1115 Cyclical vomiting syndrome unrelated to migraine: Secondary | ICD-10-CM

## 2013-12-26 DIAGNOSIS — E860 Dehydration: Secondary | ICD-10-CM

## 2013-12-26 DIAGNOSIS — N83209 Unspecified ovarian cyst, unspecified side: Secondary | ICD-10-CM

## 2013-12-26 DIAGNOSIS — R112 Nausea with vomiting, unspecified: Secondary | ICD-10-CM | POA: Diagnosis present

## 2013-12-26 DIAGNOSIS — F3289 Other specified depressive episodes: Secondary | ICD-10-CM | POA: Diagnosis present

## 2013-12-26 DIAGNOSIS — IMO0002 Reserved for concepts with insufficient information to code with codable children: Secondary | ICD-10-CM | POA: Diagnosis not present

## 2013-12-26 DIAGNOSIS — C9101 Acute lymphoblastic leukemia, in remission: Secondary | ICD-10-CM | POA: Diagnosis present

## 2013-12-26 DIAGNOSIS — N390 Urinary tract infection, site not specified: Secondary | ICD-10-CM | POA: Diagnosis not present

## 2013-12-26 DIAGNOSIS — F411 Generalized anxiety disorder: Secondary | ICD-10-CM | POA: Diagnosis present

## 2013-12-26 DIAGNOSIS — F1210 Cannabis abuse, uncomplicated: Secondary | ICD-10-CM | POA: Diagnosis present

## 2013-12-26 DIAGNOSIS — F329 Major depressive disorder, single episode, unspecified: Secondary | ICD-10-CM | POA: Diagnosis present

## 2013-12-26 HISTORY — DX: Unspecified ovarian cyst, unspecified side: N83.209

## 2013-12-26 LAB — COMPREHENSIVE METABOLIC PANEL
ALT: 58 U/L — ABNORMAL HIGH (ref 0–35)
ANION GAP: 13 (ref 5–15)
AST: 50 U/L — ABNORMAL HIGH (ref 0–37)
Albumin: 3.9 g/dL (ref 3.5–5.2)
Alkaline Phosphatase: 48 U/L (ref 39–117)
BUN: 7 mg/dL (ref 6–23)
CALCIUM: 8.6 mg/dL (ref 8.4–10.5)
CO2: 23 meq/L (ref 19–32)
Chloride: 104 mEq/L (ref 96–112)
Creatinine, Ser: 0.59 mg/dL (ref 0.50–1.10)
GLUCOSE: 129 mg/dL — AB (ref 70–99)
Potassium: 3.6 mEq/L — ABNORMAL LOW (ref 3.7–5.3)
Sodium: 140 mEq/L (ref 137–147)
Total Bilirubin: 0.4 mg/dL (ref 0.3–1.2)
Total Protein: 7.4 g/dL (ref 6.0–8.3)

## 2013-12-26 LAB — CBC
HCT: 32.5 % — ABNORMAL LOW (ref 36.0–46.0)
Hemoglobin: 11.1 g/dL — ABNORMAL LOW (ref 12.0–15.0)
MCH: 31.4 pg (ref 26.0–34.0)
MCHC: 34.2 g/dL (ref 30.0–36.0)
MCV: 91.8 fL (ref 78.0–100.0)
PLATELETS: 237 10*3/uL (ref 150–400)
RBC: 3.54 MIL/uL — ABNORMAL LOW (ref 3.87–5.11)
RDW: 12.6 % (ref 11.5–15.5)
WBC: 9.6 10*3/uL (ref 4.0–10.5)

## 2013-12-26 LAB — RAPID URINE DRUG SCREEN, HOSP PERFORMED
Amphetamines: NOT DETECTED
Barbiturates: NOT DETECTED
Benzodiazepines: NOT DETECTED
Cocaine: NOT DETECTED
OPIATES: NOT DETECTED
Tetrahydrocannabinol: POSITIVE — AB

## 2013-12-26 LAB — MAGNESIUM: Magnesium: 1.8 mg/dL (ref 1.5–2.5)

## 2013-12-26 LAB — WET PREP, GENITAL
Trich, Wet Prep: NONE SEEN
Yeast Wet Prep HPF POC: NONE SEEN

## 2013-12-26 LAB — PHOSPHORUS: Phosphorus: 3.2 mg/dL (ref 2.3–4.6)

## 2013-12-26 LAB — TSH: TSH: 0.094 u[IU]/mL — AB (ref 0.350–4.500)

## 2013-12-26 MED ORDER — SODIUM CHLORIDE 0.9 % IV BOLUS (SEPSIS)
500.0000 mL | Freq: Once | INTRAVENOUS | Status: AC
  Administered 2013-12-26: 500 mL via INTRAVENOUS

## 2013-12-26 MED ORDER — METRONIDAZOLE 0.75 % VA GEL
1.0000 | Freq: Every day | VAGINAL | Status: DC
  Administered 2013-12-26 – 2013-12-28 (×3): 1 via VAGINAL
  Filled 2013-12-26: qty 70

## 2013-12-26 MED ORDER — DEXTROSE 5 % IV SOLN: 1.0000 g | Freq: Once | INTRAVENOUS | Status: DC

## 2013-12-26 MED ORDER — HYDROCODONE-ACETAMINOPHEN 5-325 MG PO TABS: 1.0000 | ORAL_TABLET | ORAL | Status: DC | PRN

## 2013-12-26 MED ORDER — ONDANSETRON HCL 4 MG/2ML IJ SOLN
4.0000 mg | Freq: Once | INTRAMUSCULAR | Status: AC
  Administered 2013-12-26: 4 mg via INTRAVENOUS
  Filled 2013-12-26: qty 2

## 2013-12-26 MED ORDER — ONDANSETRON HCL 4 MG PO TABS: 4.0000 mg | ORAL_TABLET | Freq: Four times a day (QID) | ORAL | Status: DC | PRN

## 2013-12-26 MED ORDER — ENOXAPARIN SODIUM 40 MG/0.4ML ~~LOC~~ SOLN
40.0000 mg | SUBCUTANEOUS | Status: DC
  Administered 2013-12-26 – 2013-12-28 (×3): 40 mg via SUBCUTANEOUS
  Filled 2013-12-26 (×5): qty 0.4

## 2013-12-26 MED ORDER — DEXTROSE 5 % IV SOLN
1.0000 g | Freq: Every day | INTRAVENOUS | Status: DC
  Administered 2013-12-26 – 2013-12-29 (×4): 1 g via INTRAVENOUS
  Filled 2013-12-26 (×4): qty 10

## 2013-12-26 MED ORDER — SODIUM CHLORIDE 0.9 % IV SOLN
INTRAVENOUS | Status: AC
Start: 2013-12-26 — End: 2013-12-26
  Administered 2013-12-26 (×2): via INTRAVENOUS

## 2013-12-26 MED ORDER — ONDANSETRON HCL 4 MG/2ML IJ SOLN
4.0000 mg | Freq: Four times a day (QID) | INTRAMUSCULAR | Status: DC | PRN
  Administered 2013-12-26: 4 mg via INTRAVENOUS
  Filled 2013-12-26: qty 2

## 2013-12-26 MED ORDER — ONDANSETRON HCL 4 MG/2ML IJ SOLN: 4.0000 mg | Freq: Once | INTRAMUSCULAR | Status: DC

## 2013-12-26 MED ORDER — BOOST / RESOURCE BREEZE PO LIQD
1.0000 | Freq: Three times a day (TID) | ORAL | Status: DC
  Administered 2013-12-26 – 2013-12-28 (×4): 1 via ORAL

## 2013-12-26 MED ORDER — METOCLOPRAMIDE HCL 5 MG/ML IJ SOLN
5.0000 mg | Freq: Four times a day (QID) | INTRAMUSCULAR | Status: DC
  Administered 2013-12-26 – 2013-12-29 (×13): 5 mg via INTRAVENOUS
  Filled 2013-12-26: qty 2
  Filled 2013-12-26: qty 1
  Filled 2013-12-26: qty 2
  Filled 2013-12-26 (×10): qty 1
  Filled 2013-12-26: qty 2
  Filled 2013-12-26: qty 1
  Filled 2013-12-26 (×2): qty 2
  Filled 2013-12-26: qty 1

## 2013-12-26 MED ORDER — ACETAMINOPHEN 325 MG PO TABS: 650.0000 mg | ORAL_TABLET | Freq: Four times a day (QID) | ORAL | Status: DC | PRN

## 2013-12-26 MED ORDER — ACETAMINOPHEN 650 MG RE SUPP: 650.0000 mg | Freq: Four times a day (QID) | RECTAL | Status: DC | PRN

## 2013-12-26 MED ORDER — PROMETHAZINE HCL 25 MG/ML IJ SOLN
12.5000 mg | Freq: Four times a day (QID) | INTRAMUSCULAR | Status: DC | PRN
  Administered 2013-12-26 – 2013-12-27 (×2): 12.5 mg via INTRAVENOUS
  Filled 2013-12-26 (×2): qty 1

## 2013-12-26 MED ORDER — SODIUM CHLORIDE 0.9 % IV SOLN
INTRAVENOUS | Status: AC
  Administered 2013-12-26 – 2013-12-27 (×2): via INTRAVENOUS

## 2013-12-26 NOTE — Progress Notes (Signed)
CARE MANAGEMENT NOTE 12/26/2013  Patient:  Carla Haas, Carla Haas   Account Number:  [id number]  Date Initiated:  12/26/2013  Documentation initiated by:  Olga Coaster  Subjective/Objective Assessment:   ADMITTED WITH NAUSEA/ VOMITING, UTI     Action/Plan:   CM FOLLOWING FOR DCP   Anticipated DC Date:  12/29/2013   Anticipated DC Plan:  [place]  CM consult         Status of service:  In process, will continue to follow Medicare Important Message given?   (If response is "NO", the following Medicare IM given date fields will be blank)  Per UR Regulation:  Reviewed for med. necessity/level of care/duration of stay  Comments:  7/3/2015Mindi Slicker RN,BSN,MHA [phone]

## 2013-12-26 NOTE — H&P (Signed)
PCP:  Health department   Chief Complaint:  Nausea and vomiting  HPI: Carla Haas is a 28 y.o. female   has a past medical history of Urinary tract infection; Anxiety (2007); Anemia; Infection; Infection; Infection; Leukemia, acute lymphoid; and Leukemia.   Presented with  1 week  hx of Nasuea and vomiting poorly controlled. Here somewhat better with phenergan. Patient is a poor historian and non-copertive with questioning. States she have had similar episodes in the past. States not able to keep anything down.  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  Hospitalist was called for admission for UTI and intractable Nausea   Review of Systems:    Pertinent positives include:  nausea, vomiting, diarrhea,  Constitutional:  No weight loss, night sweats, Fevers, chills, fatigue, weight loss  HEENT:  No headaches, Difficulty swallowing,Tooth/dental problems,Sore throat,  No sneezing, itching, ear ache, nasal congestion, post nasal drip,  Cardio-vascular:  No chest pain, Orthopnea, PND, anasarca, dizziness, palpitations.no Bilateral lower extremity swelling  GI:  No heartburn, indigestion, abdominal pain,  change in bowel habits, loss of appetite, melena, blood in stool, hematemesis Resp:  no shortness of breath at rest. No dyspnea on exertion, No excess mucus, no productive cough, No non-productive cough, No coughing up of blood.No change in color of mucus.No wheezing. Skin:  no rash or lesions. No jaundice GU:  no dysuria, change in color of urine, no urgency or frequency. No straining to urinate.  No flank pain.  Musculoskeletal:  No joint pain or no joint swelling. No decreased range of motion. No back pain.  Psych:  No change in mood or affect. No depression or anxiety. No memory loss.  Neuro: no localizing neurological complaints, no tingling, no weakness, no double vision, no gait abnormality, no slurred speech, no confusion  Otherwise ROS are negative  except for above, 10 systems were reviewed  Past Medical History: Past Medical History  Diagnosis Date  . Urinary tract infection   . Anxiety 2007    SHORT COURSE OF MEDS  . Anemia     TAKING FE  . Infection     UIT X 1  . Infection     TRICH X 1  . Infection     YEAST X1  . 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  . Leukemia    Past Surgical History  Procedure Laterality Date  . Cholecystectomy      age 89  . Choley    . Cesarean section  11/20/2011    Procedure: CESAREAN SECTION;  Surgeon: Delice Lesch, MD;  Location: [facility];  Service: Gynecology;  Laterality: N/A;  Primary Cesarean Section Delivery Baby Girl @ 2350     Medications: Prior to Admission medications   Medication Sig Start Date End Date Taking? Authorizing Provider  zolpidem (AMBIEN) 5 MG tablet Take 1 tablet (5 mg total) by mouth at bedtime as needed for sleep. 02/17/12 02/16/13  Hoy Morn, MD    Allergies:  No Known Allergies  Social History:  Ambulatory independently   Lives at home alone,        reports that she has never smoked. She has never used smokeless tobacco. She reports that she uses illicit drugs (Marijuana). She reports that she does not drink alcohol.    Family History: family history includes Alcohol abuse in her mother; Lupus in her brother. There is no history of Anesthesia problems.    Physical  Exam: Patient Vitals for the past 24 hrs:  BP Temp Temp src Pulse Resp SpO2  12/25/13 2310 106/68 mmHg 98.9 F (37.2 C) Oral 84 18 100 %  12/25/13 1900 100/62 mmHg 98.4 F (36.9 C) Oral 84 18 100 %    1. General:  in No Acute distress 2. Psychological: Alert and   Oriented 3. Head/ENT:   Dry Mucous Membranes                          Head Non traumatic, neck supple                          Normal   Dentition 4. SKIN:  decreased Skin turgor,  Skin clean Dry and intact no rash 5. Heart: Regular rate and rhythm no Murmur, Rub or gallop 6. Lungs:  no wheezes  occasional mild crackles   7. Abdomen: Soft, non-tender, Non distended 8. Lower extremities: no clubbing, cyanosis, or edema 9. Neurologically Grossly intact, moving all 4 extremities equally 10. MSK: Normal range of motion  body mass index is unknown because there is no weight on file.   Labs on Admission:   Recent Labs  12/25/13 1922  NA 141  K 3.8  CL 104  CO2 23  GLUCOSE 119*  BUN 11  CREATININE 0.73  CALCIUM 9.4    Recent Labs  12/25/13 1922  AST 45*  ALT 45*  ALKPHOS 50  BILITOT 0.4  PROT 7.7  ALBUMIN 4.3    Recent Labs  12/25/13 1922  LIPASE 16    Recent Labs  12/25/13 1922  WBC 7.4  NEUTROABS 6.2  HGB 12.1  HCT 35.5*  MCV 91.7  PLT 259    Recent Labs  12/25/13 1922  TROPONINI <0.30   No results found for this basename: TSH, T4TOTAL, FREET3, T3FREE, THYROIDAB,  in the last 72 hours No results found for this basename: VITAMINB12, FOLATE, FERRITIN, TIBC, IRON, RETICCTPCT,  in the last 72 hours No results found for this basename: HGBA1C    The CrCl is unknown because both a height and weight (above a minimum accepted value) are required for this calculation. ABG    Component Value Date/Time   TCO2 22 10/24/2009 1916     No results found for this basename: DDIMER     Other results:  I have pearsonaly reviewed this: ECG REPORT  Rate: 75  Rhythm: NSR ST&T Change: no ischemic chagnes   UA mild UTI  BNP (last 3 results) No results found for this basename: PROBNP,  in the last 8760 hours  There were no vitals filed for this visit.   Cultures:    Component Value Date/Time   SDES URINE, CATHETERIZED 09/18/2012 1630   SPECREQUEST Immunocompromised 09/18/2012 1630   CULT NO GROWTH 09/18/2012 1630   REPTSTATUS 09/19/2012 FINAL 09/18/2012 1630     Radiological Exams on Admission: US Abdomen Complete  12/25/2013   CLINICAL DATA:  Right upper quadrant pain and elevated liver enzymes.  EXAM: ULTRASOUND ABDOMEN COMPLETE  COMPARISON:  CT  of the chest 12/17/2011  FINDINGS: Gallbladder:  Surgically absent  Common bile duct:  Diameter: 2 mm  Liver:  In the subcapsular right hepatic lobe anteriorly there is a hyperechoic mass measuring 18 mm. There is prominent vessels leading to the mass. This correlates with hemangioma seen on chest CT 12/17/2011. No associated subcapsular hypoechoic fluid. There is no other focal liver abnormality.  The liver echogenicity is within normal limits and there is antegrade flow in the imaged portal venous system.  IVC:  No abnormality visualized.  Pancreas:  Visualized portion unremarkable.  Spleen:  Size and appearance within normal limits.  Right Kidney:  Length: 11 cm. Echogenicity within normal limits. No mass or hydronephrosis visualized.  Left Kidney:  Length: 11 cm. Echogenicity within normal limits. No mass or hydronephrosis visualized. Prominent band of cortical tissue in the medullary region could reflect duplication of the urinary collecting system.  Abdominal aorta:  No aneurysm visualized.  Other findings:  None.  IMPRESSION: 1. No acute sonographic findings. The gallbladder is surgically absent. 2. 18 mm right hepatic hemangioma.   Electronically Signed   By: Jorje Guild M.D.   On: 12/25/2013 23:07   Dg Abd Acute W/chest  12/26/2013   CLINICAL DATA:  abdominal pain abdominal pain  EXAM: ACUTE ABDOMEN SERIES (ABDOMEN 2 VIEW & CHEST 1 VIEW)  COMPARISON:  Prior radiograph from 09/18/2012  FINDINGS: Right-sided Port-A-Cath in place. Cardiac and mediastinal silhouettes within normal limits.  Lungs are normally inflated. No focal infiltrate, pulmonary edema, or pleural effusion. No pneumothorax.  Visualized bowel gas pattern is within normal limits without evidence of obstruction or ileus. No abnormal bowel wall thickening. No free intraperitoneal air seen on lateral decubitus image. Surgical clips overlie the right upper quadrant.  Tiny 2 mm calcific density overlying the right renal shadow may represent a  small renal calculus or may lie within the fecal stream. a  No acute osseus abnormality.  IMPRESSION: 1. Nonobstructive bowel gas pattern with no radiographic evidence of acute intra-abdominal process. 2. 2 mm calcification overlying the region of the right kidney, which may represent a small nonobstructive calculus or may lie within the fecal stream. 3. No acute cardiopulmonary abnormality.   Electronically Signed   By: Jeannine Boga M.D.   On: 12/26/2013 00:21    Chart has been reviewed  Assessment/Plan  28 year old female history of leukemia in the past presents with difficult to control nausea and vomiting and evidence of dehydration and mild UTI  Present on Admission:  . UTI (lower urinary tract infection) treat with Rocephin await results of urine culture  . Dehydration give IV fluids  . Intractable nausea and vomiting symptomatic control   Prophylaxis: Lovenox  CODE STATUS:  FULL CODE  Other plan as per orders.  I have spent a total of 55 min on this admission  Kollin Udell 12/26/2013, 2:16 AM  [facility]  Pager [phone]   If 7AM-7PM, please contact the day team taking care of the patient  [email or web address]  Password [email or web address]

## 2013-12-26 NOTE — Progress Notes (Signed)
INITIAL NUTRITION ASSESSMENT  DOCUMENTATION CODES Per approved criteria  -Not Applicable   INTERVENTION:  CL diet with advancement per MD Resource Breeze po TID, each supplement provides 250 kcal and 9 grams of protein as tolerated. RD to follow.  NUTRITION DIAGNOSIS: Inadequate oral intake related to altered GI function as evidenced by nausea and clear liquid diet..   Goal: Tolerate diet advancement with intake of meals and supplements to meet >90% estimated needs.  Monitor:  Intake, labs, weight trend.  Reason for Assessment: MST  28 y.o. female  Admitting Dx: <principal problem not specified>  ASSESSMENT: Patient admitted with Nausea and vomiting, UTI, Anemia, Anxiety.  Patient with hx of acute lymphoid Leukemia 2 years ago in remission.  Patient sleeping.  Hx obtained from family. Patient with good intake until 5-6 days ago. Vomiting of solid foods for the past week. Patient with a 2 lb weight loss in the last week.  Height: Ht Readings from Last 1 Encounters:  12/26/13 4\' 11"  (1.499 m)    Weight: Wt Readings from Last 1 Encounters:  12/26/13 100 lb 15.5 oz (45.8 kg)    Ideal Body Weight: 98 lbs  % Ideal Body Weight: 98  Wt Readings from Last 10 Encounters:  12/26/13 100 lb 15.5 oz (45.8 kg)  12/17/11 102 lb (46.267 kg)  11/19/11 113 lb (51.256 kg)  11/19/11 113 lb (51.256 kg)  10/07/11 101 lb 12.8 oz (46.176 kg)  08/22/11 96 lb (43.545 kg)  08/22/11 96 lb (43.545 kg)  04/06/11 102 lb (46.267 kg)  02/08/11 96 lb 14.4 oz (43.954 kg)    Usual Body Weight: 102 lbs  % Usual Body Weight: 98  BMI:  Body mass index is 20.38 kg/(m^2).  Estimated Nutritional Needs: Kcal: 1400-1600 Protein: 55-65 gm Fluid: >1.4L daily  Skin: intact  Diet Order: Clear Liquid  EDUCATION NEEDS: -No education needs identified at this time   Intake/Output Summary (Last 24 hours) at 12/26/13 1405 Last data filed at 12/26/13 1000  Gross per 24 hour  Intake   2550  ml  Output    500 ml  Net   2050 ml    Labs:   Recent Labs Lab 12/25/13 1922 12/26/13 0515  NA 141 140  K 3.8 3.6*  CL 104 104  CO2 23 23  BUN 11 7  CREATININE 0.73 0.59  CALCIUM 9.4 8.6  MG  --  1.8  PHOS  --  3.2  GLUCOSE 119* 129*    CBG (last 3)  No results found for this basename: GLUCAP,  in the last 72 hours  Scheduled Meds: . cefTRIAXone (ROCEPHIN) IVPB 1 gram/50 mL D5W  1 g Intravenous Q0600  . enoxaparin (LOVENOX) injection  40 mg Subcutaneous Q24H  . metoCLOPramide (REGLAN) injection  5 mg Intravenous 4 times per day  . metroNIDAZOLE  1 Applicatorful Vaginal QHS    Continuous Infusions: . sodium chloride 125 mL/hr at 12/26/13 1341    Past Medical History  Diagnosis Date  . Urinary tract infection   . Anxiety 2007    SHORT COURSE OF MEDS  . Anemia     TAKING FE  . Infection     UIT X 1  . Infection     TRICH X 1  . Infection     YEAST X1  . 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  . Leukemia     Past Surgical History  Procedure Laterality Date  . Cholecystectomy  age 71  . Choley    . Cesarean section  11/20/2011    Procedure: CESAREAN SECTION;  Surgeon: Delice Lesch, MD;  Location: [facility];  Service: Gynecology;  Laterality: N/A;  Primary Cesarean Section Delivery Baby Girl @ [place], [place], LDN Clinical Inpatient Dietitian Pager:  [phone] Weekend and after hours pager:  [phone]

## 2013-12-26 NOTE — Progress Notes (Signed)
Patient seen and examined this morning, agree with H&P.  - lying in bed under covers, does not wish to talk, states that she has nausea but does not elaborate further. - continue supportive treatment as per orders.  Costin M. Cruzita Lederer, MD [facility] [phone]

## 2013-12-26 NOTE — ED Notes (Signed)
Dr. Doutova at bedside.  

## 2013-12-26 NOTE — ED Notes (Signed)
Pt ambulatory to restroom

## 2013-12-27 LAB — T4, FREE: Free T4: 1.02 ng/dL (ref 0.80–1.80)

## 2013-12-27 LAB — ACTH STIMULATION, 3 TIME POINTS
CORTISOL BASE: 21 ug/dL
Cortisol, 30 Min: 28.6 ug/dL (ref >20.0)
Cortisol, 60 Min: 35 ug/dL (ref >20)

## 2013-12-27 LAB — URINE CULTURE

## 2013-12-27 LAB — GC/CHLAMYDIA PROBE AMP
CT Probe RNA: NEGATIVE
GC Probe RNA: NEGATIVE

## 2013-12-27 LAB — T3, FREE: T3 FREE: 2.1 pg/mL — AB (ref 2.3–4.2)

## 2013-12-27 MED ORDER — COSYNTROPIN 0.25 MG IJ SOLR
0.2500 mg | Freq: Once | INTRAMUSCULAR | Status: AC
  Administered 2013-12-27: 0.25 mg via INTRAVENOUS
  Filled 2013-12-27: qty 0.25

## 2013-12-27 MED ORDER — LORAZEPAM 0.5 MG PO TABS
0.5000 mg | ORAL_TABLET | Freq: Four times a day (QID) | ORAL | Status: DC | PRN
  Administered 2013-12-27 – 2013-12-28 (×3): 0.5 mg via ORAL
  Filled 2013-12-27 (×3): qty 1

## 2013-12-27 NOTE — Progress Notes (Signed)
PROGRESS NOTE  Carla Haas GTX:[id number] DOB: 12/17/1985 DOA: 12/25/2013 PCP: Default, Provider, MD  HPI: Carla Haas is a 28 y.o. female has a past medical history of Urinary tract infection; Anxiety (2007); Anemia; Infection; Infection; Infection; Leukemia, acute lymphoid; and Leukemia.  Presented with  1 week hx of Nasuea and vomiting poorly controlled. Here somewhat better with phenergan. Patient is a poor historian and non-copertive with questioning. States she have had similar episodes in the past. States not able to keep anything down.  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  Hospitalist was called for admission for UTI and intractable Nausea   Assessment/Plan: UTI - mild improvement in her symptoms with antibiotics, cultures without specific growth. Will transition to Ciprofloxacin when better able to take po.  Decreased TSH - with normal T4 and low T3, ?in the setting of acute illness vs central cause. WIll check for adrenal insufficiency.  Nausea/vomiting - improving today, advance diet.   Patient very non-interactive, chart review shows that she has had prior episodes where she is staying in fetal position in bed with covers above her head and not talking. She was seen by Dr. 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 The patient is completely aware and  alert as well as oriented. She is utilizing this posture to reduce her perception and cope with the discomfort." will consider consulting psychiatry again if it does not improve.  Diet: advance to regular as tolerated Fluids: NS DVT Prophylaxis: Lovenox  Code Status: Full Family Communication: d/w patient  Disposition Plan: home when ready   Consultants:  None   Procedures:  None    Antibiotics  Anti-infectives   Start     Dose/Rate  Route Frequency Ordered Stop   12/26/13 0445  cefTRIAXone (ROCEPHIN) 1 g in dextrose 5 % 50 mL IVPB     1 g 100 mL/hr over 30 Minutes Intravenous Daily 12/26/13 0411     12/26/13 0030  cefTRIAXone (ROCEPHIN) 1 g in dextrose 5 % 50 mL IVPB  Status:  Discontinued     1 g 100 mL/hr over 30 Minutes Intravenous  Once 12/26/13 0026 12/26/13 0035     Antibiotics Given (last 72 hours)   Date/Time Action Medication Dose Rate   12/26/13 0437 Given   cefTRIAXone (ROCEPHIN) 1 g in dextrose 5 % 50 mL IVPB 1 g 100 mL/hr   12/27/13 0518 Given   cefTRIAXone (ROCEPHIN) 1 g in dextrose 5 % 50 mL IVPB 1 g 100 mL/hr      HPI/Subjective: Talking more today, states that nausea is better, will try some breakfast  Objective: Filed Vitals:   12/26/13 1400 12/26/13 1835 12/26/13 2122 12/27/13 0452  BP: 97/52 117/65 131/71 111/58  Pulse: 57 45 94 50  Temp: 97.6 F (36.4 C) 98.3 F (36.8 C) 98.4 F (36.9 C) 98.8 F (37.1 C)  TempSrc: Oral Oral Oral Oral  Resp: 16 17 20 16   Height:      Weight:      SpO2: 100% 100% 97% 100%    Intake/Output Summary (Last 24 hours) at 12/27/13 1135 Last data filed at 12/27/13 0453  Gross per 24 hour  Intake 1343.33 ml  Output    900 ml  Net 443.33 ml   Filed Weights   12/26/13 0419  Weight: 45.8 kg (100 lb  15.5 oz)   Exam:  General:  Curled up in bed with blanket on her head  Cardiovascular: regular rate and rhythm, without MRG  Respiratory: good air movement, clear to auscultation throughout, no wheezing, ronchi or rales  Abdomen: soft, not tender to palpation, positive bowel sounds  MSK: no peripheral edema  Neuro: non focal  Data Reviewed: Basic Metabolic Panel:  Recent Labs Lab 12/25/13 1922 12/26/13 0515  NA 141 140  K 3.8 3.6*  CL 104 104  CO2 23 23  GLUCOSE 119* 129*  BUN 11 7  CREATININE 0.73 0.59  CALCIUM 9.4 8.6  MG  --  1.8  PHOS  --  3.2   Liver Function Tests:  Recent Labs Lab 12/25/13 1922 12/26/13 0515  AST 45*  50*  ALT 45* 58*  ALKPHOS 50 48  BILITOT 0.4 0.4  PROT 7.7 7.4  ALBUMIN 4.3 3.9    Recent Labs Lab 12/25/13 1922  LIPASE 16   CBC:  Recent Labs Lab 12/25/13 1922 12/26/13 0515  WBC 7.4 9.6  NEUTROABS 6.2  --   HGB 12.1 11.1*  HCT 35.5* 32.5*  MCV 91.7 91.8  PLT 259 237   Cardiac Enzymes:  Recent Labs Lab 12/25/13 1922  TROPONINI <0.30    Recent Results (from the past 240 hour(s))  URINE CULTURE     Status: None   Collection Time    12/25/13  9:41 PM      Result Value Ref Range Status   Specimen Description URINE, CATHETERIZED   Final   Special Requests NONE   Final   Culture  Setup Time     Final   Value: 12/26/2013 03:12     Performed at [place]     Final   Value: 20,OOO COLONIES/ML     Performed at [facility]   Culture     Final   Value: Multiple bacterial morphotypes present, none predominant. Suggest appropriate recollection if clinically indicated.     Performed at [facility]   Report Status 12/27/2013 FINAL   Final  WET PREP, GENITAL     Status: Abnormal   Collection Time    12/26/13 12:09 AM      Result Value Ref Range Status   Yeast Wet Prep HPF POC NONE SEEN  NONE SEEN Final   Trich, Wet Prep NONE SEEN  NONE SEEN Final   Clue Cells Wet Prep HPF POC RARE (*) NONE SEEN Final   WBC, Wet Prep HPF POC RARE (*) NONE SEEN Final  GC/CHLAMYDIA PROBE AMP     Status: None   Collection Time    12/26/13 12:09 AM      Result Value Ref Range Status   CT Probe RNA NEGATIVE  NEGATIVE Final   GC Probe RNA NEGATIVE  NEGATIVE Final   Comment: (NOTE)                                                                                               **Normal Reference Range: Negative**          Assay  performed using the Gen-Probe APTIMA COMBO2 (R) Assay.     Acceptable specimen types for this assay include APTIMA Swabs (Unisex,     endocervical, urethral, or vaginal), first void urine, and ThinPrep     liquid based  cytology samples.     Performed at [facility]     Studies: US Abdomen Complete  12/25/2013   CLINICAL DATA:  Right upper quadrant pain and elevated liver enzymes.  EXAM: ULTRASOUND ABDOMEN COMPLETE  COMPARISON:  CT of the chest 12/17/2011  FINDINGS: Gallbladder:  Surgically absent  Common bile duct:  Diameter: 2 mm  Liver:  In the subcapsular right hepatic lobe anteriorly there is a hyperechoic mass measuring 18 mm. There is prominent vessels leading to the mass. This correlates with hemangioma seen on chest CT 12/17/2011. No associated subcapsular hypoechoic fluid. There is no other focal liver abnormality. The liver echogenicity is within normal limits and there is antegrade flow in the imaged portal venous system.  IVC:  No abnormality visualized.  Pancreas:  Visualized portion unremarkable.  Spleen:  Size and appearance within normal limits.  Right Kidney:  Length: 11 cm. Echogenicity within normal limits. No mass or hydronephrosis visualized.  Left Kidney:  Length: 11 cm. Echogenicity within normal limits. No mass or hydronephrosis visualized. Prominent band of cortical tissue in the medullary region could reflect duplication of the urinary collecting system.  Abdominal aorta:  No aneurysm visualized.  Other findings:  None.  IMPRESSION: 1. No acute sonographic findings. The gallbladder is surgically absent. 2. 18 mm right hepatic hemangioma.   Electronically Signed   By: Jorje Guild M.D.   On: 12/25/2013 23:07   US Transvaginal Non-ob  12/26/2013   CLINICAL DATA:  Pelvic pain  EXAM: TRANSABDOMINAL AND TRANSVAGINAL ULTRASOUND OF PELVIS  TECHNIQUE: Both transabdominal and transvaginal ultrasound examinations of the pelvis were performed. Transabdominal technique was performed for global imaging of the pelvis including uterus, ovaries, adnexal regions, and pelvic cul-de-sac. It was necessary to proceed with endovaginal exam following the transabdominal exam to visualize the ovaries.   COMPARISON:  None  FINDINGS: Uterus  Measurements: 8.2 x 4.3 x 5.9 cm. No fibroids or other mass visualized.  Endometrium  Thickness: 2 mm.  No focal abnormality visualized.  Right ovary  Measurements: 2.8 x 1.7 by with 3.3 cm. A 1 cm cyst is noted within the right ovary.  Left ovary  Measurements: 3.5 x 1.3 x 1.7 cm. Normal appearance/no adnexal mass.  Other findings  No free fluid.  IMPRESSION: 1 cm right ovarian cyst.  No other focal abnormality is seen.   Electronically Signed   By: Inez Catalina M.D.   On: 12/26/2013 09:03   US Pelvis Complete  12/26/2013   CLINICAL DATA:  Pelvic pain  EXAM: TRANSABDOMINAL AND TRANSVAGINAL ULTRASOUND OF PELVIS  TECHNIQUE: Both transabdominal and transvaginal ultrasound examinations of the pelvis were performed. Transabdominal technique was performed for global imaging of the pelvis including uterus, ovaries, adnexal regions, and pelvic cul-de-sac. It was necessary to proceed with endovaginal exam following the transabdominal exam to visualize the ovaries.  COMPARISON:  None  FINDINGS: Uterus  Measurements: 8.2 x 4.3 x 5.9 cm. No fibroids or other mass visualized.  Endometrium  Thickness: 2 mm.  No focal abnormality visualized.  Right ovary  Measurements: 2.8 x 1.7 by with 3.3 cm. A 1 cm cyst is noted within the right ovary.  Left ovary  Measurements: 3.5 x 1.3 x 1.7 cm. Normal appearance/no adnexal mass.  Other  findings  No free fluid.  IMPRESSION: 1 cm right ovarian cyst.  No other focal abnormality is seen.   Electronically Signed   By: Inez Catalina M.D.   On: 12/26/2013 09:03   Dg Abd Acute W/chest  12/26/2013   CLINICAL DATA:  abdominal pain abdominal pain  EXAM: ACUTE ABDOMEN SERIES (ABDOMEN 2 VIEW & CHEST 1 VIEW)  COMPARISON:  Prior radiograph from 09/18/2012  FINDINGS: Right-sided Port-A-Cath in place. Cardiac and mediastinal silhouettes within normal limits.  Lungs are normally inflated. No focal infiltrate, pulmonary edema, or pleural effusion. No pneumothorax.   Visualized bowel gas pattern is within normal limits without evidence of obstruction or ileus. No abnormal bowel wall thickening. No free intraperitoneal air seen on lateral decubitus image. Surgical clips overlie the right upper quadrant.  Tiny 2 mm calcific density overlying the right renal shadow may represent a small renal calculus or may lie within the fecal stream. a  No acute osseus abnormality.  IMPRESSION: 1. Nonobstructive bowel gas pattern with no radiographic evidence of acute intra-abdominal process. 2. 2 mm calcification overlying the region of the right kidney, which may represent a small nonobstructive calculus or may lie within the fecal stream. 3. No acute cardiopulmonary abnormality.   Electronically Signed   By: Jeannine Boga M.D.   On: 12/26/2013 00:21    Scheduled Meds: . cefTRIAXone (ROCEPHIN) IVPB 1 gram/50 mL D5W  1 g Intravenous Q0600  . enoxaparin (LOVENOX) injection  40 mg Subcutaneous Q24H  . feeding supplement (RESOURCE BREEZE)  1 Container Oral TID BM  . metoCLOPramide (REGLAN) injection  5 mg Intravenous 4 times per day  . metroNIDAZOLE  1 Applicatorful Vaginal QHS   Continuous Infusions:   Active Problems:   UTI (lower urinary tract infection)   Dehydration   Intractable nausea and vomiting   Time spent: 25  This note has been created with Surveyor, quantity. Any transcriptional errors are unintentional.   Marzetta Board, MD [facility] Pager [phone]. If 7 PM - 7 AM, please contact night-coverage at [email or web address], password [facility] 12/27/2013, 11:35 AM  LOS: 2 days

## 2013-12-28 DIAGNOSIS — F1210 Cannabis abuse, uncomplicated: Secondary | ICD-10-CM

## 2013-12-28 DIAGNOSIS — F411 Generalized anxiety disorder: Secondary | ICD-10-CM

## 2013-12-28 MED ORDER — ESCITALOPRAM OXALATE 5 MG PO TABS
5.0000 mg | ORAL_TABLET | Freq: Every day | ORAL | Status: DC
  Administered 2013-12-28: 5 mg via ORAL
  Filled 2013-12-28 (×2): qty 1

## 2013-12-28 MED ORDER — SODIUM CHLORIDE 0.9 % IV SOLN
INTRAVENOUS | Status: AC
  Administered 2013-12-28 (×2): via INTRAVENOUS

## 2013-12-28 MED ORDER — LORAZEPAM 0.5 MG PO TABS
0.5000 mg | ORAL_TABLET | Freq: Two times a day (BID) | ORAL | Status: DC
  Administered 2013-12-28: 0.5 mg via ORAL
  Filled 2013-12-28: qty 1

## 2013-12-28 NOTE — Consult Note (Signed)
[facility] Face-to-Face Psychiatry Consult   Reason for Consult:  Depression, anxiety and non communicative with staff Referring Physician:  Dr. Isaias Sakai is an 28 y.o. female. Total Time spent with patient: 45 minutes  Assessment: AXIS I:  Generalized Anxiety Disorder, cannabis abuse AXIS II:  Deferred AXIS III:   Past Medical History  Diagnosis Date  . Urinary tract infection   . Anxiety 2007    SHORT COURSE OF MEDS  . Anemia     TAKING FE  . Infection     UIT X 1  . Infection     TRICH X 1  . Infection     YEAST X1  . 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  . Leukemia    AXIS IV:  other psychosocial or environmental problems, problems related to social environment and problems with primary support group AXIS V:  41-50 serious symptoms  Plan:  No evidence of imminent risk to self or others at present.   Patient does not meet criteria for psychiatric inpatient admission. Supportive therapy provided about ongoing stressors. Discussed crisis plan, support from social network, calling 911, coming to the Emergency Department, and calling Suicide Hotline. Provide Lexapro 5 mg daily and continue Ativan for anxiety Appreciate psychiatric consultation followup as clinically acquired Please contact [phone] if needs further assistance  Subjective:   Carla Haas is a 28 y.o. female patient admitted with depression and non communicative.  HPI:  Patient is seen, chart reviewed and case discussed with the staff RN. Patient reported she has been suffering with anxiety which is mostly generalized, isolated, withdrawn and a poorly communicative with staffmbers.  patient reported she is having trouble keeping her food and drinks secondary to nausea and vomiting. Staff RN reported patient is somewhat better today than yesterday. Patient has no previous history of acute psychiatric hospitalization or outpatient treatment. Patient is willing to receive medication  for depression and anxiety after a brief discussion about risks and benefits of the medication. Patient urine drug screen is positive for tetrahydrocannabinol.  Medical history: Carla Haas is a 28 y.o. female has a past medical history of Urinary tract infection; Anxiety (2007); Anemia; Infection; Infection; Infection; Leukemia, acute lymphoid; and Leukemia. Presented with 1 week hx of Nasuea and vomiting poorly controlled. Here somewhat better with phenergan. Patient is a poor historian and non-copertive with questioning. States she have had similar episodes in the past. States not able to keep anything down. 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 Hospitalist was called for admission for UTI and intractable Nausea   HPI Elements:   Location:  anxiety. Quality:  poor. Severity:  acute. Timing:  medical illness.  Past Psychiatric History: Past Medical History  Diagnosis Date  . Urinary tract infection   . Anxiety 2007    SHORT COURSE OF MEDS  . Anemia     TAKING FE  . Infection     UIT X 1  . Infection     TRICH X 1  . Infection     YEAST X1  . 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  . Leukemia     reports that she has never smoked. She has never used smokeless tobacco. She reports that she uses illicit drugs (Marijuana). She reports that she does not drink alcohol. Family History  Problem Relation Age of Onset  . Anesthesia problems Neg Hx   .  Alcohol abuse Mother   . Lupus Brother      Living Arrangements: Children   Abuse/Neglect [facility]) Physical Abuse: Denies Verbal Abuse: Denies Sexual Abuse: Denies Allergies:  No Known Allergies  ACT Assessment Complete:  No  Objective: Blood pressure 121/82, pulse 57, temperature 100 F (37.8 C), temperature source Oral, resp. rate 16, height $RemoveBe'4\' 11"'aOuxdEsYX$  (1.499 m), weight 45.8 kg (100 lb 15.5 oz), SpO2 100.00%.Body mass index is 20.38 kg/(m^2). Results for orders placed during the hospital encounter  of 12/25/13 (from the past 72 hour(s))  CBC WITH DIFFERENTIAL     Status: Abnormal   Collection Time    12/25/13  7:22 PM      Result Value Ref Range   WBC 7.4  4.0 - 10.5 K/uL   RBC 3.87  3.87 - 5.11 MIL/uL   Hemoglobin 12.1  12.0 - 15.0 g/dL   HCT 35.5 (*) 36.0 - 46.0 %   MCV 91.7  78.0 - 100.0 fL   MCH 31.3  26.0 - 34.0 pg   MCHC 34.1  30.0 - 36.0 g/dL   RDW 12.5  11.5 - 15.5 %   Platelets 259  150 - 400 K/uL   Neutrophils Relative % 84 (*) 43 - 77 %   Neutro Abs 6.2  1.7 - 7.7 K/uL   Lymphocytes Relative 12  12 - 46 %   Lymphs Abs 0.9  0.7 - 4.0 K/uL   Monocytes Relative 4  3 - 12 %   Monocytes Absolute 0.3  0.1 - 1.0 K/uL   Eosinophils Relative 0  0 - 5 %   Eosinophils Absolute 0.0  0.0 - 0.7 K/uL   Basophils Relative 0  0 - 1 %   Basophils Absolute 0.0  0.0 - 0.1 K/uL  COMPREHENSIVE METABOLIC PANEL     Status: Abnormal   Collection Time    12/25/13  7:22 PM      Result Value Ref Range   Sodium 141  137 - 147 mEq/L   Potassium 3.8  3.7 - 5.3 mEq/L   Chloride 104  96 - 112 mEq/L   CO2 23  19 - 32 mEq/L   Glucose, Bld 119 (*) 70 - 99 mg/dL   BUN 11  6 - 23 mg/dL   Creatinine, Ser 0.73  0.50 - 1.10 mg/dL   Calcium 9.4  8.4 - 10.5 mg/dL   Total Protein 7.7  6.0 - 8.3 g/dL   Albumin 4.3  3.5 - 5.2 g/dL   AST 45 (*) 0 - 37 U/L   ALT 45 (*) 0 - 35 U/L   Alkaline Phosphatase 50  39 - 117 U/L   Total Bilirubin 0.4  0.3 - 1.2 mg/dL   GFR calc non Af Amer >90  >90 mL/min   GFR calc Af Amer >90  >90 mL/min   Comment: (NOTE)     The eGFR has been calculated using the CKD EPI equation.     This calculation has not been validated in all clinical situations.     eGFR's persistently <90 mL/min signify possible Chronic Kidney     Disease.   Anion gap 14  5 - 15  LIPASE, BLOOD     Status: None   Collection Time    12/25/13  7:22 PM      Result Value Ref Range   Lipase 16  11 - 59 U/L  TROPONIN I     Status: None   Collection Time    12/25/13  7:22 PM      Result Value Ref  Range   Troponin I <0.30  <0.30 ng/mL   Comment:            Due to the release kinetics of cTnI,     a negative result within the first hours     of the onset of symptoms does not rule out     myocardial infarction with certainty.     If myocardial infarction is still suspected,     repeat the test at appropriate intervals.  URINALYSIS, ROUTINE W REFLEX MICROSCOPIC     Status: Abnormal   Collection Time    12/25/13  8:39 PM      Result Value Ref Range   Color, Urine YELLOW  YELLOW   APPearance CLOUDY (*) CLEAR   Specific Gravity, Urine 1.019  1.005 - 1.030   pH 7.0  5.0 - 8.0   Glucose, UA NEGATIVE  NEGATIVE mg/dL   Hgb urine dipstick MODERATE (*) NEGATIVE   Bilirubin Urine NEGATIVE  NEGATIVE   Ketones, ur >80 (*) NEGATIVE mg/dL   Protein, ur 100 (*) NEGATIVE mg/dL   Urobilinogen, UA 1.0  0.0 - 1.0 mg/dL   Nitrite NEGATIVE  NEGATIVE   Leukocytes, UA MODERATE (*) NEGATIVE  URINE MICROSCOPIC-ADD ON     Status: Abnormal   Collection Time    12/25/13  8:39 PM      Result Value Ref Range   Squamous Epithelial / LPF RARE  RARE   WBC, UA 7-10  <3 WBC/hpf   RBC / HPF 3-6  <3 RBC/hpf   Bacteria, UA FEW (*) RARE  POC URINE PREG, ED     Status: None   Collection Time    12/25/13  8:47 PM      Result Value Ref Range   Preg Test, Ur NEGATIVE  NEGATIVE   Comment:            THE SENSITIVITY OF THIS     METHODOLOGY IS >24 mIU/mL  I-STAT CG4 LACTIC ACID, ED     Status: None   Collection Time    12/25/13  9:11 PM      Result Value Ref Range   Lactic Acid, Venous 1.50  0.5 - 2.2 mmol/L  URINE CULTURE     Status: None   Collection Time    12/25/13  9:41 PM      Result Value Ref Range   Specimen Description URINE, CATHETERIZED     Special Requests NONE     Culture  Setup Time       Value: 12/26/2013 03:12     Performed at [facility] Count       Value: 20,OOO COLONIES/ML     Performed at [facility]   Culture       Value: Multiple bacterial morphotypes  present, none predominant. Suggest appropriate recollection if clinically indicated.     Performed at [facility]   Report Status 12/27/2013 FINAL    WET PREP, GENITAL     Status: Abnormal   Collection Time    12/26/13 12:09 AM      Result Value Ref Range   Yeast Wet Prep HPF POC NONE SEEN  NONE SEEN   Trich, Wet Prep NONE SEEN  NONE SEEN   Clue Cells Wet Prep HPF POC RARE (*) NONE SEEN   WBC, Wet Prep HPF POC RARE (*) NONE SEEN  GC/CHLAMYDIA PROBE AMP  Status: None   Collection Time    12/26/13 12:09 AM      Result Value Ref Range   CT Probe RNA NEGATIVE  NEGATIVE   GC Probe RNA NEGATIVE  NEGATIVE   Comment: (NOTE)                                                                                               **Normal Reference Range: Negative**          Assay performed using the Gen-Probe APTIMA COMBO2 (R) Assay.     Acceptable specimen types for this assay include APTIMA Swabs (Unisex,     endocervical, urethral, or vaginal), first void urine, and ThinPrep     liquid based cytology samples.     Performed at [place] ([place])     Status: Abnormal   Collection Time    12/26/13 12:29 AM      Result Value Ref Range   Opiates NONE DETECTED  NONE DETECTED   Cocaine NONE DETECTED  NONE DETECTED   Benzodiazepines NONE DETECTED  NONE DETECTED   Amphetamines NONE DETECTED  NONE DETECTED   Tetrahydrocannabinol POSITIVE (*) NONE DETECTED   Barbiturates NONE DETECTED  NONE DETECTED   Comment:            DRUG SCREEN FOR MEDICAL PURPOSES     ONLY.  IF CONFIRMATION IS NEEDED     FOR ANY PURPOSE, NOTIFY LAB     WITHIN 5 DAYS.                LOWEST DETECTABLE LIMITS     FOR URINE DRUG SCREEN     Drug Class       Cutoff (ng/mL)     Amphetamine      1000     Barbiturate      200     Benzodiazepine   626     Tricyclics       948     Opiates          300     Cocaine          300     THC              50  MAGNESIUM     Status: None    Collection Time    12/26/13  5:15 AM      Result Value Ref Range   Magnesium 1.8  1.5 - 2.5 mg/dL  PHOSPHORUS     Status: None   Collection Time    12/26/13  5:15 AM      Result Value Ref Range   Phosphorus 3.2  2.3 - 4.6 mg/dL  TSH     Status: Abnormal   Collection Time    12/26/13  5:15 AM      Result Value Ref Range   TSH 0.094 (*) 0.350 - 4.500 uIU/mL   Comment: Performed at [place] PANEL     Status: Abnormal   Collection Time    12/26/13  5:15 AM  Result Value Ref Range   Sodium 140  137 - 147 mEq/L   Potassium 3.6 (*) 3.7 - 5.3 mEq/L   Chloride 104  96 - 112 mEq/L   CO2 23  19 - 32 mEq/L   Glucose, Bld 129 (*) 70 - 99 mg/dL   BUN 7  6 - 23 mg/dL   Creatinine, Ser 0.59  0.50 - 1.10 mg/dL   Calcium 8.6  8.4 - 10.5 mg/dL   Total Protein 7.4  6.0 - 8.3 g/dL   Albumin 3.9  3.5 - 5.2 g/dL   AST 50 (*) 0 - 37 U/L   ALT 58 (*) 0 - 35 U/L   Alkaline Phosphatase 48  39 - 117 U/L   Total Bilirubin 0.4  0.3 - 1.2 mg/dL   GFR calc non Af Amer >90  >90 mL/min   GFR calc Af Amer >90  >90 mL/min   Comment: (NOTE)     The eGFR has been calculated using the CKD EPI equation.     This calculation has not been validated in all clinical situations.     eGFR's persistently <90 mL/min signify possible Chronic Kidney     Disease.   Anion gap 13  5 - 15  CBC     Status: Abnormal   Collection Time    12/26/13  5:15 AM      Result Value Ref Range   WBC 9.6  4.0 - 10.5 K/uL   RBC 3.54 (*) 3.87 - 5.11 MIL/uL   Hemoglobin 11.1 (*) 12.0 - 15.0 g/dL   HCT 32.5 (*) 36.0 - 46.0 %   MCV 91.8  78.0 - 100.0 fL   MCH 31.4  26.0 - 34.0 pg   MCHC 34.2  30.0 - 36.0 g/dL   RDW 12.6  11.5 - 15.5 %   Platelets 237  150 - 400 K/uL  T3, FREE     Status: Abnormal   Collection Time    12/26/13  3:30 PM      Result Value Ref Range   T3, Free 2.1 (*) 2.3 - 4.2 pg/mL   Comment: Performed at [facility]  T4, FREE     Status: None   Collection Time     12/26/13  3:30 PM      Result Value Ref Range   Free T4 1.02  0.80 - 1.80 ng/dL   Comment: Performed at [place], 3 TIME POINTS     Status: None   Collection Time    12/27/13 10:00 AM      Result Value Ref Range   Cortisol, Base 21.0     Comment: (NOTE)     AM:  4.3 - 22.4 ug/dL     PM:  3.1 - 16.7 ug/dL   Cortisol, 30 Min 28.6  >20.0 ug/dL   Cortisol, 60 Min 35.0  >20 ug/dL   Comment: Performed at [facility] are reviewed and are pertinent for .  Current Facility-Administered Medications  Medication Dose Route Frequency Provider Last Rate Last Dose  . 0.9 %  sodium chloride infusion   Intravenous Continuous Dianne Dun, NP 75 mL/hr at 12/28/13 0521    . acetaminophen (TYLENOL) tablet 650 mg  650 mg Oral Q6H PRN Toy Baker, MD       Or  . acetaminophen (TYLENOL) suppository 650 mg  650 mg Rectal Q6H PRN Toy Baker, MD      . cefTRIAXone (ROCEPHIN)  1 g in dextrose 5 % 50 mL IVPB  1 g Intravenous Q0600 Toy Baker, MD   1 g at 12/28/13 0514  . enoxaparin (LOVENOX) injection 40 mg  40 mg Subcutaneous Q24H Toy Baker, MD   40 mg at 12/28/13 0955  . feeding supplement (RESOURCE BREEZE) (RESOURCE BREEZE) liquid 1 Container  1 Container Oral TID BM Darrol Jump, RD   1 Container at 12/28/13 1000  . HYDROcodone-acetaminophen (NORCO/VICODIN) 5-325 MG per tablet 1-2 tablet  1-2 tablet Oral Q4H PRN Toy Baker, MD      . LORazepam (ATIVAN) tablet 0.5 mg  0.5 mg Oral Q6H PRN Caren Griffins, MD   0.5 mg at 12/28/13 4037  . metoCLOPramide (REGLAN) injection 5 mg  5 mg Intravenous 4 times per day Toy Baker, MD   5 mg at 12/28/13 1257  . metroNIDAZOLE (METROGEL) 0.75 % vaginal gel 1 Applicatorful  1 Applicatorful Vaginal QHS Caren Griffins, MD   1 Applicatorful at 54/36/06 2119  . ondansetron (ZOFRAN) tablet 4 mg  4 mg Oral Q6H PRN Toy Baker, MD       Or  . ondansetron (ZOFRAN)  injection 4 mg  4 mg Intravenous Q6H PRN Toy Baker, MD   4 mg at 12/26/13 2034  . promethazine (PHENERGAN) injection 12.5 mg  12.5 mg Intravenous Q6H PRN Dianne Dun, NP   12.5 mg at 12/27/13 1601    Psychiatric Specialty Exam: Physical Exam Full physical performed in Emergency Department. I have reviewed this assessment and concur with its findings.   Review of Systems  Gastrointestinal: Positive for nausea and vomiting.  Genitourinary: Positive for dysuria and frequency.  Psychiatric/Behavioral: Positive for substance abuse. The patient is nervous/anxious.     Blood pressure 121/82, pulse 57, temperature 100 F (37.8 C), temperature source Oral, resp. rate 16, height _0  (1.499 m), weight 45.8 kg (100 lb 15.5 oz), SpO2 100.00%.Body mass index is 20.38 kg/(m^2).  General Appearance: Disheveled and Guarded  Eye Contact::  Minimal  Speech:  Slow  Volume:  Decreased  Mood:  Anxious  Affect:  Flat  Thought Process:  Linear  Orientation:  Full (Time, Place, and Person)  Thought Content:  WDL  Suicidal Thoughts:  No  Homicidal Thoughts:  No  Memory:  Immediate;   Fair Recent;   Fair  Judgement:  Intact  Insight:  Fair  Psychomotor Activity:  Psychomotor Retardation  Concentration:  Fair  Recall:  Smiley Houseman of Knowledge:Fair  Language: Good  Akathisia:  NA  Handed:  Right  AIMS (if indicated):     Assets:  Communication Skills Desire for Improvement Financial Resources/Insurance Housing Intimacy Leisure Time Resilience Social Support Talents/Skills  Sleep:      Musculoskeletal: Strength & Muscle Tone: within normal limits Gait & Station: normal Patient leans: N/A  Treatment Plan Summary: Daily contact with patient to assess and evaluate symptoms and progress in treatment Medication management  Wynonna Fitzhenry,JANARDHAHA R. 12/28/2013 1:10 PM

## 2013-12-28 NOTE — Progress Notes (Signed)
PROGRESS NOTE  Carla Haas EXH:[id number] DOB: 12/09/1985 DOA: 12/25/2013 PCP: Default, Provider, MD  HPI: Carla Haas is a 28 y.o. female has a past medical history of Urinary tract infection; Anxiety (2007); Anemia; Infection; Infection; Infection; Leukemia, acute lymphoid; and Leukemia.  Presented with  1 week hx of Nasuea and vomiting poorly controlled. Here somewhat better with phenergan. Patient is a poor historian and non-copertive with questioning. States she have had similar episodes in the past. States not able to keep anything down.  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  Hospitalist was called for admission for UTI and intractable Nausea   Assessment/Plan: UTI - mild improvement in her symptoms with antibiotics, cultures without specific growth. Will transition to Ciprofloxacin when better able to take po.  Decreased TSH - with normal T4 and low T3, ?in the setting of acute illness vs central cause.  - cortisol stim test normal, she will need repeat thyroid tests in 2-3 weeks once acute issues resolve.  Nausea/vomiting - improving today, advance diet to regular  Anxiety - patient with increasing anxiety in the past day, still withdrawn and not interacting much with the medical staff, talked to me a little bit this morning while in fetal position and would not move, asking to take frequent showers to help with her anxiety. She has a history of leukemia and was lost to follow up, states that she is unable to make it there due to her car not working or not having help with her children.  - suspect that she might have underlying psychiatric illness, consulted psychiatry today. She was seen at one point in 2008 by Dr. Rhona Raider.  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    Diet: advance to regular as  tolerated Fluids: NS DVT Prophylaxis: Lovenox  Code Status: Full Family Communication: d/w patient  Disposition Plan: home when ready, likely 24 hours  Consultants:  None   Procedures:  None    Antibiotics  Anti-infectives   Start     Dose/Rate Route Frequency Ordered Stop   12/26/13 0445  cefTRIAXone (ROCEPHIN) 1 g in dextrose 5 % 50 mL IVPB     1 g 100 mL/hr over 30 Minutes Intravenous Daily 12/26/13 0411     12/26/13 0030  cefTRIAXone (ROCEPHIN) 1 g in dextrose 5 % 50 mL IVPB  Status:  Discontinued     1 g 100 mL/hr over 30 Minutes Intravenous  Once 12/26/13 0026 12/26/13 0035     Antibiotics Given (last 72 hours)   Date/Time Action Medication Dose Rate   12/26/13 0437 Given   cefTRIAXone (ROCEPHIN) 1 g in dextrose 5 % 50 mL IVPB 1 g 100 mL/hr   12/27/13 0518 Given   cefTRIAXone (ROCEPHIN) 1 g in dextrose 5 % 50 mL IVPB 1 g 100 mL/hr   12/28/13 0514 Given   cefTRIAXone (ROCEPHIN) 1 g in dextrose 5 % 50 mL IVPB 1 g 100 mL/hr      HPI/Subjective: - a bit more interactive today  Objective: Filed Vitals:   12/26/13 2122 12/27/13 0452 12/27/13 1351 12/27/13 2123  BP: 131/71 111/58 103/50 121/82  Pulse: 94 50 51 57  Temp: 98.4 F (36.9 C) 98.8 F (37.1 C) 98.7 F (37.1 C) 100 F (37.8 C)  TempSrc: Oral Oral Oral Oral  Resp: 20 16 16 16   Height:      Weight:      SpO2: 97% 100% 100% 100%    Intake/Output Summary (Last 24 hours) at 12/28/13 1003 Last data filed at 12/27/13 2123  Gross per 24 hour  Intake      0 ml  Output   1450 ml  Net  -1450 ml   Filed Weights   12/26/13 0419  Weight: 45.8 kg (100 lb 15.5 oz)   Exam:  General:  NAD  Cardiovascular: regular rate and rhythm, without MRG  Respiratory: good air movement, clear to auscultation throughout, no wheezing, ronchi or rales  Abdomen: soft, not tender to palpation, positive bowel sounds  MSK: no peripheral edema  Neuro: non focal  Data Reviewed: Basic Metabolic Panel:  Recent  Labs Lab 12/25/13 1922 12/26/13 0515  NA 141 140  K 3.8 3.6*  CL 104 104  CO2 23 23  GLUCOSE 119* 129*  BUN 11 7  CREATININE 0.73 0.59  CALCIUM 9.4 8.6  MG  --  1.8  PHOS  --  3.2   Liver Function Tests:  Recent Labs Lab 12/25/13 1922 12/26/13 0515  AST 45* 50*  ALT 45* 58*  ALKPHOS 50 48  BILITOT 0.4 0.4  PROT 7.7 7.4  ALBUMIN 4.3 3.9    Recent Labs Lab 12/25/13 1922  LIPASE 16   CBC:  Recent Labs Lab 12/25/13 1922 12/26/13 0515  WBC 7.4 9.6  NEUTROABS 6.2  --   HGB 12.1 11.1*  HCT 35.5* 32.5*  MCV 91.7 91.8  PLT 259 237   Cardiac Enzymes:  Recent Labs Lab 12/25/13 1922  TROPONINI <0.30    Recent Results (from the past 240 hour(s))  URINE CULTURE     Status: None   Collection Time    12/25/13  9:41 PM      Result Value Ref Range Status   Specimen Description URINE, CATHETERIZED   Final   Special Requests NONE   Final   Culture  Setup Time     Final   Value: 12/26/2013 03:12     Performed at [facility]     Final   Value: 20,OOO COLONIES/ML     Performed at [facility]   Culture     Final   Value: Multiple bacterial morphotypes present, none predominant. Suggest appropriate recollection if clinically indicated.     Performed at [facility]   Report Status 12/27/2013 FINAL   Final  WET PREP, GENITAL     Status: Abnormal   Collection Time    12/26/13 12:09 AM      Result Value Ref Range Status   Yeast Wet Prep HPF POC NONE SEEN  NONE SEEN Final   Trich, Wet Prep NONE SEEN  NONE SEEN Final   Clue Cells Wet Prep HPF POC RARE (*) NONE SEEN Final   WBC, Wet Prep HPF POC RARE (*) NONE SEEN Final  GC/CHLAMYDIA PROBE AMP     Status: None   Collection Time    12/26/13 12:09 AM      Result Value Ref Range Status   CT Probe RNA NEGATIVE  NEGATIVE Final   GC Probe RNA NEGATIVE  NEGATIVE Final   Comment: (NOTE)                                                                                                **  Normal Reference Range: Negative**          Assay performed using the Gen-Probe APTIMA COMBO2 (R) Assay.     Acceptable specimen types for this assay include APTIMA Swabs (Unisex,     endocervical, urethral, or vaginal), first void urine, and ThinPrep     liquid based cytology samples.     Performed at [facility]     Studies: No results found.  Scheduled Meds: . cefTRIAXone (ROCEPHIN) IVPB 1 gram/50 mL D5W  1 g Intravenous Q0600  . enoxaparin (LOVENOX) injection  40 mg Subcutaneous Q24H  . feeding supplement (RESOURCE BREEZE)  1 Container Oral TID BM  . metoCLOPramide (REGLAN) injection  5 mg Intravenous 4 times per day  . metroNIDAZOLE  1 Applicatorful Vaginal QHS   Continuous Infusions: . sodium chloride 75 mL/hr at 12/28/13 3094    Active Problems:   UTI (lower urinary tract infection)   Dehydration   Intractable nausea and vomiting   Time spent: 25  This note has been created with Surveyor, quantity. Any transcriptional errors are unintentional.   Marzetta Board, MD [facility] Pager [phone]. If 7 PM - 7 AM, please contact night-coverage at [email or web address], password [facility] 12/28/2013, 10:03 AM  LOS: 3 days

## 2013-12-29 MED ORDER — LORAZEPAM 0.5 MG PO TABS: 0.5000 mg | ORAL_TABLET | Freq: Two times a day (BID) | ORAL | Status: DC | PRN

## 2013-12-29 MED ORDER — ONDANSETRON HCL 4 MG PO TABS: 4.0000 mg | ORAL_TABLET | Freq: Four times a day (QID) | ORAL | Status: DC | PRN

## 2013-12-29 MED ORDER — METRONIDAZOLE 0.75 % VA GEL: 1.0000 | Freq: Every day | VAGINAL | Status: DC

## 2013-12-29 MED ORDER — ENSURE COMPLETE PO LIQD: 237.0000 mL | Freq: Two times a day (BID) | ORAL | Status: DC

## 2013-12-29 MED ORDER — HYDROCODONE-ACETAMINOPHEN 5-325 MG PO TABS: 1.0000 | ORAL_TABLET | ORAL | Status: DC | PRN

## 2013-12-29 MED ORDER — ESCITALOPRAM OXALATE 5 MG PO TABS: 5.0000 mg | ORAL_TABLET | Freq: Every day | ORAL | Status: DC

## 2013-12-29 NOTE — Discharge Instructions (Signed)

## 2013-12-29 NOTE — Discharge Summary (Signed)
Physician Discharge Summary  Carla Haas ZGY:[id number] DOB: Nov 07, 1985 DOA: 12/25/2013  PCP: Default, Provider, MD  Admit date: 12/25/2013 Discharge date: 12/29/2013  Time spent: 35 minutes  Recommendations for Outpatient Follow-up:  1. Follow up with [facility] ASAP 2. Follow up with PCP in 1-2 weeks    Discharge Diagnoses:  Active Problems:   UTI (lower urinary tract infection)   Dehydration   Intractable nausea and vomiting  Discharge Condition: stable  Diet recommendation: regular   Filed Weights   12/26/13 0419  Weight: 45.8 kg (100 lb 15.5 oz)   History of present illness:  Carla Haas is a 28 y.o. female has a past medical history of Urinary tract infection; Anxiety (2007); Anemia; Infection; Infection; Infection; Leukemia, acute lymphoid; and Leukemia. Presented with 1 week hx of Nasuea and vomiting poorly controlled. Here somewhat better with phenergan. Patient is a poor historian and non-copertive with questioning. States she have had similar episodes in the past. States not able to keep anything down. 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 Hospitalist was called for admission for UTI and intractable Nausea   Hospital Course:  UTI - mild improvement in her symptoms with antibiotics, cultures without specific growth. Received 4 days of Ceftriaxone in the ED, no need further antibiotics after discharge.  Decreased TSH - with normal T4 and low T3, ?in the setting of acute illness vs central cause.  - cortisol stim test normal, she will need repeat thyroid tests in 2-3 weeks once acute issues resolve.  Nausea/vomiting - resolved, tolerating regular diet  Anxiety - patient with increasing anxiety during her stay, withdrawn and not interacting much with the medical staff. Psychiatry was consulted and saw patient while here, started on Lexapro and Ativan. Advised outpatient follow up. 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 She wishes to follow up at [facility], states that transportation is no longer an issue.  Procedures:  None    Consultations:  None   Discharge Exam: Filed Vitals:   12/27/13 2123 12/28/13 1333 12/28/13 2100 12/29/13 0611  BP: 121/82 106/62 122/75 103/58  Pulse: 57 71 65 51  Temp: 100 F (37.8 C) 98.6 F (37 C) 98.6 F (37 C) 98.7 F (37.1 C)  TempSrc: Oral Oral Oral Oral  Resp: 16 16 16 16   Height:      Weight:      SpO2: 100% 100% 96% 98%   General: NAD Cardiovascular: RRR Respiratory: CTA biL  Discharge Instructions     Medication List         escitalopram 5 MG tablet  Commonly known as:  LEXAPRO  Take 1 tablet (5 mg total) by mouth at bedtime.     HYDROcodone-acetaminophen 5-325 MG per tablet  Commonly known as:  NORCO/VICODIN  Take 1-2 tablets by mouth every 4 (four) hours as needed for moderate pain.     LORazepam 0.5 MG tablet  Commonly known as:  ATIVAN  Take 1 tablet (0.5 mg total) by mouth 2 (two) times daily as needed for anxiety.     metroNIDAZOLE 0.75 % vaginal gel  Commonly known as:  METROGEL  Place 1 Applicatorful vaginally at bedtime.     ondansetron 4 MG tablet  Commonly known as:  ZOFRAN  Take 1 tablet (4 mg total) by mouth  every 6 (six) hours as needed for nausea.     zolpidem 5 MG tablet  Commonly known as:  AMBIEN  Take 1 tablet (5 mg total) by mouth at bedtime as needed for sleep.           Follow-up Information   Follow up with [facility]    . Schedule an appointment as soon as possible for a visit in 2 weeks.   Contact information:   [address] [place] [address] [phone]      Follow up with [facility]. Call today.      The results of significant diagnostics from this hospitalization (including imaging, microbiology, ancillary and  laboratory) are listed below for reference.    Significant Diagnostic Studies: US Abdomen Complete  12/25/2013   CLINICAL DATA:  Right upper quadrant pain and elevated liver enzymes.  EXAM: ULTRASOUND ABDOMEN COMPLETE  COMPARISON:  CT of the chest 12/17/2011  FINDINGS: Gallbladder:  Surgically absent  Common bile duct:  Diameter: 2 mm  Liver:  In the subcapsular right hepatic lobe anteriorly there is a hyperechoic mass measuring 18 mm. There is prominent vessels leading to the mass. This correlates with hemangioma seen on chest CT 12/17/2011. No associated subcapsular hypoechoic fluid. There is no other focal liver abnormality. The liver echogenicity is within normal limits and there is antegrade flow in the imaged portal venous system.  IVC:  No abnormality visualized.  Pancreas:  Visualized portion unremarkable.  Spleen:  Size and appearance within normal limits.  Right Kidney:  Length: 11 cm. Echogenicity within normal limits. No mass or hydronephrosis visualized.  Left Kidney:  Length: 11 cm. Echogenicity within normal limits. No mass or hydronephrosis visualized. Prominent band of cortical tissue in the medullary region could reflect duplication of the urinary collecting system.  Abdominal aorta:  No aneurysm visualized.  Other findings:  None.  IMPRESSION: 1. No acute sonographic findings. The gallbladder is surgically absent. 2. 18 mm right hepatic hemangioma.   Electronically Signed   By: Jorje Guild M.D.   On: 12/25/2013 23:07   US Transvaginal Non-ob  12/26/2013   CLINICAL DATA:  Pelvic pain  EXAM: TRANSABDOMINAL AND TRANSVAGINAL ULTRASOUND OF PELVIS  TECHNIQUE: Both transabdominal and transvaginal ultrasound examinations of the pelvis were performed. Transabdominal technique was performed for global imaging of the pelvis including uterus, ovaries, adnexal regions, and pelvic cul-de-sac. It was necessary to proceed with endovaginal exam following the transabdominal exam to visualize the ovaries.   COMPARISON:  None  FINDINGS: Uterus  Measurements: 8.2 x 4.3 x 5.9 cm. No fibroids or other mass visualized.  Endometrium  Thickness: 2 mm.  No focal abnormality visualized.  Right ovary  Measurements: 2.8 x 1.7 by with 3.3 cm. A 1 cm cyst is noted within the right ovary.  Left ovary  Measurements: 3.5 x 1.3 x 1.7 cm. Normal appearance/no adnexal mass.  Other findings  No free fluid.  IMPRESSION: 1 cm right ovarian cyst.  No other focal abnormality is seen.   Electronically Signed   By: Inez Catalina M.D.   On: 12/26/2013 09:03   US Pelvis Complete  12/26/2013   CLINICAL DATA:  Pelvic pain  EXAM: TRANSABDOMINAL AND TRANSVAGINAL ULTRASOUND OF PELVIS  TECHNIQUE: Both transabdominal and transvaginal ultrasound examinations of the pelvis were performed. Transabdominal technique was performed for global imaging of the pelvis including uterus, ovaries, adnexal regions, and pelvic cul-de-sac. It was necessary to proceed with endovaginal exam following the transabdominal  exam to visualize the ovaries.  COMPARISON:  None  FINDINGS: Uterus  Measurements: 8.2 x 4.3 x 5.9 cm. No fibroids or other mass visualized.  Endometrium  Thickness: 2 mm.  No focal abnormality visualized.  Right ovary  Measurements: 2.8 x 1.7 by with 3.3 cm. A 1 cm cyst is noted within the right ovary.  Left ovary  Measurements: 3.5 x 1.3 x 1.7 cm. Normal appearance/no adnexal mass.  Other findings  No free fluid.  IMPRESSION: 1 cm right ovarian cyst.  No other focal abnormality is seen.   Electronically Signed   By: Inez Catalina M.D.   On: 12/26/2013 09:03   Dg Abd Acute W/chest  12/26/2013   CLINICAL DATA:  abdominal pain abdominal pain  EXAM: ACUTE ABDOMEN SERIES (ABDOMEN 2 VIEW & CHEST 1 VIEW)  COMPARISON:  Prior radiograph from 09/18/2012  FINDINGS: Right-sided Port-A-Cath in place. Cardiac and mediastinal silhouettes within normal limits.  Lungs are normally inflated. No focal infiltrate, pulmonary edema, or pleural effusion. No pneumothorax.   Visualized bowel gas pattern is within normal limits without evidence of obstruction or ileus. No abnormal bowel wall thickening. No free intraperitoneal air seen on lateral decubitus image. Surgical clips overlie the right upper quadrant.  Tiny 2 mm calcific density overlying the right renal shadow may represent a small renal calculus or may lie within the fecal stream. a  No acute osseus abnormality.  IMPRESSION: 1. Nonobstructive bowel gas pattern with no radiographic evidence of acute intra-abdominal process. 2. 2 mm calcification overlying the region of the right kidney, which may represent a small nonobstructive calculus or may lie within the fecal stream. 3. No acute cardiopulmonary abnormality.   Electronically Signed   By: Jeannine Boga M.D.   On: 12/26/2013 00:21    Microbiology: Recent Results (from the past 240 hour(s))  URINE CULTURE     Status: None   Collection Time    12/25/13  9:41 PM      Result Value Ref Range Status   Specimen Description URINE, CATHETERIZED   Final   Special Requests NONE   Final   Culture  Setup Time     Final   Value: 12/26/2013 03:12     Performed at [place]     Final   Value: 20,OOO COLONIES/ML     Performed at [facility]   Culture     Final   Value: Multiple bacterial morphotypes present, none predominant. Suggest appropriate recollection if clinically indicated.     Performed at [facility]   Report Status 12/27/2013 FINAL   Final  WET PREP, GENITAL     Status: Abnormal   Collection Time    12/26/13 12:09 AM      Result Value Ref Range Status   Yeast Wet Prep HPF POC NONE SEEN  NONE SEEN Final   Trich, Wet Prep NONE SEEN  NONE SEEN Final   Clue Cells Wet Prep HPF POC RARE (*) NONE SEEN Final   WBC, Wet Prep HPF POC RARE (*) NONE SEEN Final  GC/CHLAMYDIA PROBE AMP     Status: None   Collection Time    12/26/13 12:09 AM      Result Value Ref Range Status   CT Probe RNA NEGATIVE  NEGATIVE  Final   GC Probe RNA NEGATIVE  NEGATIVE Final   Comment: (NOTE)                                                                                               **  Normal Reference Range: Negative**          Assay performed using the Gen-Probe APTIMA COMBO2 (R) Assay.     Acceptable specimen types for this assay include APTIMA Swabs (Unisex,     endocervical, urethral, or vaginal), first void urine, and ThinPrep     liquid based cytology samples.     Performed at [facility]: Basic Metabolic Panel:  Recent Labs Lab 12/25/13 1922 12/26/13 0515  NA 141 140  K 3.8 3.6*  CL 104 104  CO2 23 23  GLUCOSE 119* 129*  BUN 11 7  CREATININE 0.73 0.59  CALCIUM 9.4 8.6  MG  --  1.8  PHOS  --  3.2   Liver Function Tests:  Recent Labs Lab 12/25/13 1922 12/26/13 0515  AST 45* 50*  ALT 45* 58*  ALKPHOS 50 48  BILITOT 0.4 0.4  PROT 7.7 7.4  ALBUMIN 4.3 3.9    Recent Labs Lab 12/25/13 1922  LIPASE 16   CBC:  Recent Labs Lab 12/25/13 1922 12/26/13 0515  WBC 7.4 9.6  NEUTROABS 6.2  --   HGB 12.1 11.1*  HCT 35.5* 32.5*  MCV 91.7 91.8  PLT 259 237   Cardiac Enzymes:  Recent Labs Lab 12/25/13 1922  TROPONINI <0.30    Signed:  Marzetta Board  [facility] 12/29/2013, 3:38 PM

## 2013-12-29 NOTE — ED Provider Notes (Signed)
Medical screening examination/treatment/procedure(s) were performed by non-physician practitioner and as supervising physician I was immediately available for consultation/collaboration.   EKG Interpretation   Date/Time:  Thursday December 25 2013 19:49:28 EDT Ventricular Rate:  75 PR Interval:  153 QRS Duration: 100 QT Interval:  411 QTC Calculation: 459 R Axis:   19 Text Interpretation:  Sinus rhythm ED PHYSICIAN INTERPRETATION AVAILABLE  IN [facility] [place] Confirmed by TEST, Record ([id number]) on 12/27/2013 2:22:56  PM        Wandra Arthurs, MD 12/29/13 [phone]

## 2013-12-29 NOTE — Progress Notes (Signed)
Patient discharged home, all discharge medications and instructions reviewed and questions answered. Patient to be assisted to vehicle by wheelchair.  

## 2014-01-02 LAB — ACTH: C206 ACTH: 15 pg/mL (ref 6–50)

## 2014-02-13 ENCOUNTER — Inpatient Hospital Stay (HOSPITAL_COMMUNITY)
Admission: AD | Admit: 2014-02-13 | Discharge: 2014-02-13 | Disposition: A | Discharge status: Home / Self Care | Payer: Medicaid Other | Source: Ambulatory Visit | Attending: Obstetrics & Gynecology | Admitting: Obstetrics & Gynecology

## 2014-02-13 ENCOUNTER — Encounter (HOSPITAL_COMMUNITY): Payer: Self-pay | Admitting: *Deleted

## 2014-02-13 DIAGNOSIS — N926 Irregular menstruation, unspecified: Secondary | ICD-10-CM | POA: Insufficient documentation

## 2014-02-13 DIAGNOSIS — Z3202 Encounter for pregnancy test, result negative: Secondary | ICD-10-CM | POA: Diagnosis not present

## 2014-02-13 HISTORY — DX: Unspecified ovarian cyst, unspecified side: N83.209

## 2014-02-13 LAB — URINALYSIS, ROUTINE W REFLEX MICROSCOPIC
Bilirubin Urine: NEGATIVE
Glucose, UA: NEGATIVE mg/dL
Ketones, ur: NEGATIVE mg/dL
Leukocytes, UA: NEGATIVE
NITRITE: NEGATIVE
PROTEIN: NEGATIVE mg/dL
Specific Gravity, Urine: 1.02 (ref 1.005–1.030)
UROBILINOGEN UA: 1 mg/dL (ref 0.0–1.0)
pH: 6 (ref 5.0–8.0)

## 2014-02-13 LAB — URINE MICROSCOPIC-ADD ON

## 2014-02-13 LAB — POCT PREGNANCY, URINE: PREG TEST UR: NEGATIVE

## 2014-02-13 NOTE — MAU Note (Signed)
Pt started spotting on 02-09-14 and today still experiencing very light flow which is not normal for pt.  Usually much heavier.  Pt is unsure if she might be pregnant.  No vaginal itching or burning or abnormal discharge.

## 2014-02-13 NOTE — Discharge Instructions (Signed)

## 2014-02-13 NOTE — MAU Provider Note (Signed)
History     CSN: [id number]  Arrival date and time: 02/13/14 0904   None     Chief Complaint  Patient presents with  . Vaginal Bleeding  . Possible Pregnancy   HPI 28 y.o. [id number] here d/t concern about possible pregnancy. States she began spotting on 8/17, which is a few days earlier than she expects her period. She states she bleeding has gradually become heavier, but is not as heavy as her normal period. No pain or discharge.   Past Medical History  Diagnosis Date  . Urinary tract infection   . Anxiety 2007    SHORT COURSE OF MEDS  . Anemia     TAKING FE  . Infection     UIT X 1  . Infection     TRICH X 1  . Infection     YEAST X1  . 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  . Leukemia   . Ovarian cyst 12-26-2013    Right side    Past Surgical History  Procedure Laterality Date  . Cholecystectomy      age 14  . Choley    . Cesarean section  11/20/2011    Procedure: CESAREAN SECTION;  Surgeon: Delice Lesch, MD;  Location: [facility];  Service: Gynecology;  Laterality: N/A;  Primary Cesarean Section Delivery Baby Girl @ 61    Family History  Problem Relation Age of Onset  . Anesthesia problems Neg Hx   . Alcohol abuse Mother   . Lupus Brother     History  Substance Use Topics  . Smoking status: Never Smoker   . Smokeless tobacco: Never Used  . Alcohol Use: No    Allergies: No Known Allergies  Prescriptions prior to admission  Medication Sig Dispense Refill  . escitalopram (LEXAPRO) 5 MG tablet Take 1 tablet (5 mg total) by mouth at bedtime.  30 tablet  1  . HYDROcodone-acetaminophen (NORCO/VICODIN) 5-325 MG per tablet Take 1-2 tablets by mouth every 4 (four) hours as needed for moderate pain.  10 tablet  0  . LORazepam (ATIVAN) 0.5 MG tablet Take 1 tablet (0.5 mg total) by mouth 2 (two) times daily as needed for anxiety.  30 tablet  0  . metroNIDAZOLE (METROGEL) 0.75 % vaginal gel Place 1 Applicatorful vaginally at bedtime.  70 g  0   . ondansetron (ZOFRAN) 4 MG tablet Take 1 tablet (4 mg total) by mouth every 6 (six) hours as needed for nausea.  20 tablet  0  . zolpidem (AMBIEN) 5 MG tablet Take 1 tablet (5 mg total) by mouth at bedtime as needed for sleep.  7 tablet  0    ROS Physical Exam   Height 4' 11.5" (1.511 m), weight 98 lb 6.4 oz (44.634 kg).  Physical Exam  Nursing note and vitals reviewed. Constitutional: She is oriented to person, place, and time. She appears well-developed and well-nourished. No distress.  Cardiovascular: Normal rate.   Respiratory: Effort normal.  Neurological: She is alert and oriented to person, place, and time.  Skin: Skin is warm and dry.  Psychiatric: She has a normal mood and affect.    MAU Course  Procedures  Results for orders placed during the hospital encounter of 02/13/14 (from the past 24 hour(s))  URINALYSIS, ROUTINE W REFLEX MICROSCOPIC     Status: Abnormal   Collection Time    02/13/14  9:10 AM      Result Value Ref Range  Color, Urine YELLOW  YELLOW   APPearance CLEAR  CLEAR   Specific Gravity, Urine 1.020  1.005 - 1.030   pH 6.0  5.0 - 8.0   Glucose, UA NEGATIVE  NEGATIVE mg/dL   Hgb urine dipstick LARGE (*) NEGATIVE   Bilirubin Urine NEGATIVE  NEGATIVE   Ketones, ur NEGATIVE  NEGATIVE mg/dL   Protein, ur NEGATIVE  NEGATIVE mg/dL   Urobilinogen, UA 1.0  0.0 - 1.0 mg/dL   Nitrite NEGATIVE  NEGATIVE   Leukocytes, UA NEGATIVE  NEGATIVE  URINE MICROSCOPIC-ADD ON     Status: Abnormal   Collection Time    02/13/14  9:10 AM      Result Value Ref Range   Squamous Epithelial / LPF FEW (*) RARE   RBC / HPF 7-10  <3 RBC/hpf   Bacteria, UA FEW (*) RARE   Urine-Other MUCOUS PRESENT    POCT PREGNANCY, URINE     Status: None   Collection Time    02/13/14  9:20 AM      Result Value Ref Range   Preg Test, Ur NEGATIVE  NEGATIVE     Assessment and Plan   1. Menses, irregular   Discussed normal variations in menstrual cycle, may f/u outpatient w/ ongoing  irregularity. Repeat home pregnancy test if still concerned about possibility of pregnancy in 1 week. Return w/ excessive bleeding or pain, or if UPT +.     Medication List    Notice   You have not been prescribed any medications.          Follow-up Information   Follow up with [facility]. (As needed)    Specialty:  Obstetrics and Gynecology   Contact information:   [place] [address] [phone]        [facility] 02/13/2014, 9:28 AM

## 2014-02-16 NOTE — MAU Provider Note (Signed)
Attestation of Attending Supervision of Advanced Practitioner (PA/CNM/NP): Evaluation and management procedures were performed by the Advanced Practitioner under my supervision and collaboration.  I have reviewed the Advanced Practitioner's note and chart, and I agree with the management and plan.  Zenas Santa, MD, FACOG Attending Obstetrician & Gynecologist Faculty Practice, [facility]   

## 2014-04-05 ENCOUNTER — Emergency Department (HOSPITAL_COMMUNITY)
Admission: EM | Admit: 2014-04-05 | Discharge: 2014-04-06 | Disposition: A | Discharge status: Other Acute Inpatient Hospital | Payer: Medicare Other | Attending: Emergency Medicine | Admitting: Emergency Medicine

## 2014-04-05 ENCOUNTER — Encounter (HOSPITAL_COMMUNITY): Payer: Self-pay | Admitting: Emergency Medicine

## 2014-04-05 DIAGNOSIS — Y929 Unspecified place or not applicable: Secondary | ICD-10-CM | POA: Insufficient documentation

## 2014-04-05 DIAGNOSIS — Y939 Activity, unspecified: Secondary | ICD-10-CM | POA: Insufficient documentation

## 2014-04-05 DIAGNOSIS — R000 Tachycardia, unspecified: Secondary | ICD-10-CM | POA: Diagnosis not present

## 2014-04-05 DIAGNOSIS — S8011XA Contusion of right lower leg, initial encounter: Secondary | ICD-10-CM | POA: Insufficient documentation

## 2014-04-05 DIAGNOSIS — S40021A Contusion of right upper arm, initial encounter: Secondary | ICD-10-CM | POA: Insufficient documentation

## 2014-04-05 DIAGNOSIS — R630 Anorexia: Secondary | ICD-10-CM | POA: Insufficient documentation

## 2014-04-05 DIAGNOSIS — Z8619 Personal history of other infectious and parasitic diseases: Secondary | ICD-10-CM | POA: Diagnosis not present

## 2014-04-05 DIAGNOSIS — R21 Rash and other nonspecific skin eruption: Secondary | ICD-10-CM | POA: Diagnosis not present

## 2014-04-05 DIAGNOSIS — Z3202 Encounter for pregnancy test, result negative: Secondary | ICD-10-CM | POA: Diagnosis not present

## 2014-04-05 DIAGNOSIS — D696 Thrombocytopenia, unspecified: Secondary | ICD-10-CM | POA: Diagnosis not present

## 2014-04-05 DIAGNOSIS — F419 Anxiety disorder, unspecified: Secondary | ICD-10-CM | POA: Insufficient documentation

## 2014-04-05 DIAGNOSIS — W228XXA Striking against or struck by other objects, initial encounter: Secondary | ICD-10-CM | POA: Insufficient documentation

## 2014-04-05 DIAGNOSIS — Z87440 Personal history of urinary (tract) infections: Secondary | ICD-10-CM | POA: Diagnosis not present

## 2014-04-05 DIAGNOSIS — D649 Anemia, unspecified: Secondary | ICD-10-CM | POA: Insufficient documentation

## 2014-04-05 DIAGNOSIS — N946 Dysmenorrhea, unspecified: Secondary | ICD-10-CM | POA: Diagnosis present

## 2014-04-05 DIAGNOSIS — C9100 Acute lymphoblastic leukemia not having achieved remission: Secondary | ICD-10-CM | POA: Diagnosis not present

## 2014-04-05 LAB — COMPREHENSIVE METABOLIC PANEL
ALBUMIN: 3.8 g/dL (ref 3.5–5.2)
ALT: 10 U/L (ref 0–35)
AST: 22 U/L (ref 0–37)
Alkaline Phosphatase: 82 U/L (ref 39–117)
Anion gap: 14 (ref 5–15)
BUN: 8 mg/dL (ref 6–23)
CALCIUM: 8.5 mg/dL (ref 8.4–10.5)
CO2: 21 mEq/L (ref 19–32)
CREATININE: 0.73 mg/dL (ref 0.50–1.10)
Chloride: 105 mEq/L (ref 96–112)
GFR calc Af Amer: >90 mL/min (ref >90)
GFR calc non Af Amer: >90 mL/min (ref >90)
Glucose, Bld: 80 mg/dL (ref 70–99)
Potassium: 4.9 mEq/L (ref 3.7–5.3)
Sodium: 140 mEq/L (ref 137–147)
TOTAL PROTEIN: 7 g/dL (ref 6.0–8.3)
Total Bilirubin: 0.8 mg/dL (ref 0.3–1.2)

## 2014-04-05 LAB — URINALYSIS, ROUTINE W REFLEX MICROSCOPIC
GLUCOSE, UA: NEGATIVE mg/dL
HGB URINE DIPSTICK: NEGATIVE
Ketones, ur: >80 mg/dL — AB
Leukocytes, UA: NEGATIVE
Nitrite: NEGATIVE
PH: 6 (ref 5.0–8.0)
Protein, ur: NEGATIVE mg/dL
SPECIFIC GRAVITY, URINE: 1.03 (ref 1.005–1.030)
Urobilinogen, UA: 1 mg/dL (ref 0.0–1.0)

## 2014-04-05 LAB — CBC WITH DIFFERENTIAL/PLATELET
Basophils Absolute: 0 10*3/uL (ref 0.0–0.1)
Basophils Relative: 0 % (ref 0–1)
EOS PCT: 0 % (ref 0–5)
Eosinophils Absolute: 0 10*3/uL (ref 0.0–0.7)
HCT: 25.9 % — ABNORMAL LOW (ref 36.0–46.0)
Hemoglobin: 8.7 g/dL — ABNORMAL LOW (ref 12.0–15.0)
Lymphocytes Relative: 97 % — ABNORMAL HIGH (ref 12–46)
Lymphs Abs: 119.3 10*3/uL — ABNORMAL HIGH (ref 0.7–4.0)
MCH: 29.5 pg (ref 26.0–34.0)
MCHC: 33.6 g/dL (ref 30.0–36.0)
MCV: 87.8 fL (ref 78.0–100.0)
MONOS PCT: 0 % — AB (ref 3–12)
Monocytes Absolute: 0 10*3/uL — ABNORMAL LOW (ref 0.1–1.0)
Neutro Abs: 3.7 10*3/uL (ref 1.7–7.7)
Neutrophils Relative %: 3 % — ABNORMAL LOW (ref 43–77)
Platelets: 11 10*3/uL — CL (ref 150–400)
RBC: 2.95 MIL/uL — AB (ref 3.87–5.11)
RDW: 17.1 % — ABNORMAL HIGH (ref 11.5–15.5)
WBC: 123 10*3/uL — AB (ref 4.0–10.5)

## 2014-04-05 LAB — LACTATE DEHYDROGENASE: LDH: 819 U/L — AB (ref 94–250)

## 2014-04-05 LAB — PROTIME-INR
INR: 1.42 (ref 0.00–1.49)
Prothrombin Time: 17.5 seconds — ABNORMAL HIGH (ref 11.6–15.2)

## 2014-04-05 LAB — PREGNANCY, URINE: Preg Test, Ur: NEGATIVE

## 2014-04-05 LAB — APTT: APTT: 30 s (ref 24–37)

## 2014-04-05 MED ORDER — SODIUM CHLORIDE 0.9 % IV BOLUS (SEPSIS)
1000.0000 mL | Freq: Once | INTRAVENOUS | Status: AC
  Administered 2014-04-05: 1000 mL via INTRAVENOUS

## 2014-04-05 NOTE — ED Notes (Signed)
Patient instructed that a urine specimen was needed. 

## 2014-04-05 NOTE — ED Notes (Signed)
Pt dx with leukemia last year.  Has had 2-3 areas of discolored skin show up in last few days.  Has had decrease in appetite.  States menstrual cycle is extremely light.

## 2014-04-05 NOTE — ED Provider Notes (Signed)
CSN: [id number]     Arrival date & time 04/05/14  0840 History   First MD Initiated Contact with Patient 04/05/14 0857     Chief Complaint  Patient presents with  . Dysmenorrhea  . Skin Discoloration     (Consider location/radiation/quality/duration/timing/severity/associated sxs/prior Treatment) The history is provided by the patient.   patient presents with some discolored areas on her body. States that she noticed some on her arms and her legs. Is not painful. She states she did not hit them on anything. States she's worried because she has a history of leukemia. She states she has not followed up recently because she has been too busy with her children to be able go to [place]. No other bruising. No bleeding was brushing her teeth. No fatigue. She states she has had anxiety after she eats. She states decreased appetite because of it. No chest pain. Abdominal pain. No fevers. No chills. She thinks that she could have a cold. She is also complaining of some mild vaginal bleeding. She states it is lighter than normal menses. She states began around a week ago. She states she does not know when her last period was in states his been switching up some. She does not think she is pregnant.  Past Medical History  Diagnosis Date  . Urinary tract infection   . Anxiety 2007    SHORT COURSE OF MEDS  . Anemia     TAKING FE  . Infection     UIT X 1  . Infection     TRICH X 1  . Infection     YEAST X1  . 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  . Leukemia   . Ovarian cyst 12-26-2013    Right side   Past Surgical History  Procedure Laterality Date  . Cholecystectomy      age 27  . Choley    . Cesarean section  11/20/2011    Procedure: CESAREAN SECTION;  Surgeon: Delice Lesch, MD;  Location: [facility];  Service: Gynecology;  Laterality: N/A;  Primary Cesarean Section Delivery Baby Girl @ 2350  . Power port  2012   Family History  Problem Relation Age of Onset  .  Anesthesia problems Neg Hx   . Alcohol abuse Mother   . Lupus Brother    History  Substance Use Topics  . Smoking status: Never Smoker   . Smokeless tobacco: Never Used  . Alcohol Use: No   OB History   Grav Para Term Preterm Abortions TAB SAB Ect Mult Living   5 4 3 1 1  0 1 0 0 3     Review of Systems  Constitutional: Positive for appetite change. Negative for activity change.  HENT: Positive for congestion. Negative for sore throat.   Eyes: Negative for pain.  Respiratory: Negative for chest tightness and shortness of breath.   Cardiovascular: Negative for chest pain and leg swelling.  Gastrointestinal: Negative for nausea, vomiting, abdominal pain and diarrhea.  Genitourinary: Positive for vaginal bleeding. Negative for flank pain.  Musculoskeletal: Negative for back pain and neck stiffness.  Skin: Positive for rash.  Neurological: Negative for weakness, numbness and headaches.  Hematological: Bruises/bleeds easily.  Psychiatric/Behavioral: Negative for behavioral problems. The patient is nervous/anxious.       Allergies  Review of patient's allergies indicates no known allergies.  Home Medications   Prior to Admission medications   Not on File   BP 98/55  Pulse 95  Temp(Src) 98.3 F (36.8 C) (Oral)  Resp 17  SpO2 100%  LMP 03/31/2014 Physical Exam  Nursing note and vitals reviewed. Constitutional: She is oriented to person, place, and time. She appears well-developed and well-nourished.  HENT:  Head: Normocephalic and atraumatic.  Views punctate red spots on posterior soft palate.  Eyes: No scleral icterus.  Neck: Normal range of motion. Neck supple.  Cardiovascular: Regular rhythm and normal heart sounds.   No murmur heard. Mild tachycardia  Pulmonary/Chest: Effort normal and breath sounds normal. No respiratory distress. She has no wheezes. She has no rales.  Abdominal: Soft. Bowel sounds are normal. She exhibits no distension. There is no tenderness.  There is no rebound and no guarding.  Musculoskeletal: Normal range of motion.  Neurological: She is alert and oriented to person, place, and time. No cranial nerve deficit.  Skin: Skin is warm and dry. There is pallor.  Linear area of darkening versus ecchymosis on right upper arm right below deltoid also has slightly darkened spot posterior on her right lower leg. She has a couple areas of ecchymosis with mild swelling on her anterior lower leg. She states she hit this on something. Patient is somewhat pale. She states she has a history of anemia  Psychiatric: She has a normal mood and affect. Her speech is normal.    ED Course  Procedures (including critical care time) Labs Review Labs Reviewed  URINALYSIS, ROUTINE W REFLEX MICROSCOPIC - Abnormal; Notable for the following:    Bilirubin Urine SMALL (*)    Ketones, ur >80 (*)    All other components within normal limits  CBC WITH DIFFERENTIAL - Abnormal; Notable for the following:    WBC 123.0 (*)    RBC 2.95 (*)    Hemoglobin 8.7 (*)    HCT 25.9 (*)    RDW 17.1 (*)    Platelets 11 (*)    Neutrophils Relative % 3 (*)    Lymphocytes Relative 97 (*)    Monocytes Relative 0 (*)    Lymphs Abs 119.3 (*)    Monocytes Absolute 0.0 (*)    All other components within normal limits  PROTIME-INR - Abnormal; Notable for the following:    Prothrombin Time 17.5 (*)    All other components within normal limits  LACTATE DEHYDROGENASE - Abnormal; Notable for the following:    LDH 819 (*)    All other components within normal limits  PREGNANCY, URINE  APTT  COMPREHENSIVE METABOLIC PANEL  PATHOLOGIST SMEAR REVIEW    Imaging Review No results found.   EKG Interpretation None      MDM   Final diagnoses:  ALL (acute lymphoblastic leukemia)  Anemia, unspecified anemia type  Thrombocytopenia    Patient with some bruising. History available it was in remission. 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 WBC of 123,000, hemoglobin 8 platelets  11. I discussed with Dr Benay Spice, will transfer to [facility]. D/w Dr Florene Glen, who accepted the patient.     Jasper Riling. Alvino Chapel, MD 04/05/14 1224

## 2014-04-05 NOTE — ED Notes (Signed)
Admissions Nurse at [facility] called and gave room number [phone]. And call report to [phone] when ready to transport.

## 2014-04-06 LAB — PATHOLOGIST SMEAR REVIEW

## 2014-04-27 ENCOUNTER — Encounter (HOSPITAL_COMMUNITY): Payer: Self-pay | Admitting: Emergency Medicine

## 2014-05-16 ENCOUNTER — Inpatient Hospital Stay (HOSPITAL_COMMUNITY): Payer: Medicare Other

## 2014-05-16 ENCOUNTER — Inpatient Hospital Stay (HOSPITAL_COMMUNITY)
Admission: EM | Admit: 2014-05-16 | Discharge: 2014-05-22 | DRG: 871 | Disposition: A | Discharge status: Other Acute Inpatient Hospital | Payer: Medicare Other | Attending: Pulmonary Disease | Admitting: Pulmonary Disease

## 2014-05-16 ENCOUNTER — Encounter (HOSPITAL_COMMUNITY): Payer: Self-pay | Admitting: Emergency Medicine

## 2014-05-16 DIAGNOSIS — D696 Thrombocytopenia, unspecified: Secondary | ICD-10-CM | POA: Diagnosis not present

## 2014-05-16 DIAGNOSIS — F1290 Cannabis use, unspecified, uncomplicated: Secondary | ICD-10-CM | POA: Diagnosis not present

## 2014-05-16 DIAGNOSIS — G9340 Encephalopathy, unspecified: Secondary | ICD-10-CM | POA: Diagnosis not present

## 2014-05-16 DIAGNOSIS — C9200 Acute myeloblastic leukemia, not having achieved remission: Secondary | ICD-10-CM

## 2014-05-16 DIAGNOSIS — T451X5A Adverse effect of antineoplastic and immunosuppressive drugs, initial encounter: Secondary | ICD-10-CM | POA: Diagnosis present

## 2014-05-16 DIAGNOSIS — D6959 Other secondary thrombocytopenia: Secondary | ICD-10-CM

## 2014-05-16 DIAGNOSIS — E876 Hypokalemia: Secondary | ICD-10-CM | POA: Diagnosis present

## 2014-05-16 DIAGNOSIS — R5081 Fever presenting with conditions classified elsewhere: Secondary | ICD-10-CM

## 2014-05-16 DIAGNOSIS — B9689 Other specified bacterial agents as the cause of diseases classified elsewhere: Secondary | ICD-10-CM | POA: Diagnosis not present

## 2014-05-16 DIAGNOSIS — A0472 Enterocolitis due to Clostridium difficile, not specified as recurrent: Secondary | ICD-10-CM | POA: Diagnosis present

## 2014-05-16 DIAGNOSIS — A419 Sepsis, unspecified organism: Secondary | ICD-10-CM | POA: Diagnosis present

## 2014-05-16 DIAGNOSIS — Z515 Encounter for palliative care: Secondary | ICD-10-CM

## 2014-05-16 DIAGNOSIS — R401 Stupor: Secondary | ICD-10-CM

## 2014-05-16 DIAGNOSIS — R509 Fever, unspecified: Secondary | ICD-10-CM

## 2014-05-16 DIAGNOSIS — C9100 Acute lymphoblastic leukemia not having achieved remission: Secondary | ICD-10-CM | POA: Diagnosis not present

## 2014-05-16 DIAGNOSIS — K859 Acute pancreatitis without necrosis or infection, unspecified: Secondary | ICD-10-CM

## 2014-05-16 DIAGNOSIS — Z9221 Personal history of antineoplastic chemotherapy: Secondary | ICD-10-CM | POA: Diagnosis not present

## 2014-05-16 DIAGNOSIS — E872 Acidosis: Secondary | ICD-10-CM | POA: Diagnosis present

## 2014-05-16 DIAGNOSIS — J918 Pleural effusion in other conditions classified elsewhere: Secondary | ICD-10-CM

## 2014-05-16 DIAGNOSIS — E2740 Unspecified adrenocortical insufficiency: Secondary | ICD-10-CM | POA: Diagnosis present

## 2014-05-16 DIAGNOSIS — D709 Neutropenia, unspecified: Secondary | ICD-10-CM | POA: Diagnosis present

## 2014-05-16 DIAGNOSIS — J189 Pneumonia, unspecified organism: Secondary | ICD-10-CM | POA: Diagnosis not present

## 2014-05-16 DIAGNOSIS — D61810 Antineoplastic chemotherapy induced pancytopenia: Secondary | ICD-10-CM | POA: Diagnosis not present

## 2014-05-16 DIAGNOSIS — J9690 Respiratory failure, unspecified, unspecified whether with hypoxia or hypercapnia: Secondary | ICD-10-CM

## 2014-05-16 DIAGNOSIS — I9589 Other hypotension: Secondary | ICD-10-CM

## 2014-05-16 DIAGNOSIS — K37 Unspecified appendicitis: Secondary | ICD-10-CM

## 2014-05-16 DIAGNOSIS — J9601 Acute respiratory failure with hypoxia: Secondary | ICD-10-CM | POA: Diagnosis not present

## 2014-05-16 DIAGNOSIS — T50905A Adverse effect of unspecified drugs, medicaments and biological substances, initial encounter: Secondary | ICD-10-CM

## 2014-05-16 DIAGNOSIS — R6521 Severe sepsis with septic shock: Secondary | ICD-10-CM | POA: Diagnosis present

## 2014-05-16 DIAGNOSIS — N12 Tubulo-interstitial nephritis, not specified as acute or chronic: Secondary | ICD-10-CM

## 2014-05-16 DIAGNOSIS — R5383 Other fatigue: Secondary | ICD-10-CM

## 2014-05-16 DIAGNOSIS — J90 Pleural effusion, not elsewhere classified: Secondary | ICD-10-CM

## 2014-05-16 HISTORY — DX: Calculus of kidney: N20.0

## 2014-05-16 HISTORY — DX: Acute myeloblastic leukemia, not having achieved remission: C92.00

## 2014-05-16 HISTORY — DX: Pneumonia, unspecified organism: J18.9

## 2014-05-16 LAB — URINALYSIS, ROUTINE W REFLEX MICROSCOPIC
BILIRUBIN URINE: NEGATIVE
BILIRUBIN URINE: NEGATIVE
Glucose, UA: NEGATIVE mg/dL
Glucose, UA: NEGATIVE mg/dL
HGB URINE DIPSTICK: NEGATIVE
KETONES UR: NEGATIVE mg/dL
Ketones, ur: NEGATIVE mg/dL
LEUKOCYTES UA: NEGATIVE
Leukocytes, UA: NEGATIVE
NITRITE: NEGATIVE
Nitrite: NEGATIVE
PH: 6.5 (ref 5.0–8.0)
PH: 6 (ref 5.0–8.0)
Protein, ur: NEGATIVE mg/dL
Protein, ur: NEGATIVE mg/dL
Specific Gravity, Urine: 1.019 (ref 1.005–1.030)
Specific Gravity, Urine: 1.012 (ref 1.005–1.030)
Urobilinogen, UA: 1 mg/dL (ref 0.0–1.0)
Urobilinogen, UA: 0.2 mg/dL (ref 0.0–1.0)

## 2014-05-16 LAB — CBC WITH DIFFERENTIAL/PLATELET
Basophils Absolute: 0 10*3/uL (ref 0.0–0.1)
Basophils Relative: 0 % (ref 0–1)
Eosinophils Absolute: 0 10*3/uL (ref 0.0–0.7)
Eosinophils Relative: 0 % (ref 0–5)
HEMATOCRIT: 24.4 % — AB (ref 36.0–46.0)
HEMOGLOBIN: 8.4 g/dL — AB (ref 12.0–15.0)
LYMPHS PCT: 100 % — AB (ref 12–46)
Lymphs Abs: 0.5 10*3/uL — ABNORMAL LOW (ref 0.7–4.0)
MCH: 29.6 pg (ref 26.0–34.0)
MCHC: 34.4 g/dL (ref 30.0–36.0)
MCV: 85.9 fL (ref 78.0–100.0)
MONOS PCT: 0 % — AB (ref 3–12)
Monocytes Absolute: 0 10*3/uL — ABNORMAL LOW (ref 0.1–1.0)
NEUTROS ABS: 0 10*3/uL — AB (ref 1.7–7.7)
Neutrophils Relative %: 0 % — ABNORMAL LOW (ref 43–77)
Platelets: 25 10*3/uL — CL (ref 150–400)
RBC: 2.84 MIL/uL — AB (ref 3.87–5.11)
RDW: 12.8 % (ref 11.5–15.5)
WBC: 0.5 10*3/uL — CL (ref 4.0–10.5)

## 2014-05-16 LAB — COMPREHENSIVE METABOLIC PANEL
ALBUMIN: 3.7 g/dL (ref 3.5–5.2)
ALK PHOS: 108 U/L (ref 39–117)
ALT: 28 U/L (ref 0–35)
ALT: 20 U/L (ref 0–35)
ANION GAP: 15 (ref 5–15)
AST: 24 U/L (ref 0–37)
AST: 19 U/L (ref 0–37)
Albumin: 2.5 g/dL — ABNORMAL LOW (ref 3.5–5.2)
Alkaline Phosphatase: 76 U/L (ref 39–117)
Anion gap: 15 (ref 5–15)
BILIRUBIN TOTAL: 0.8 mg/dL (ref 0.3–1.2)
BUN: 15 mg/dL (ref 6–23)
BUN: 18 mg/dL (ref 6–23)
CALCIUM: 7.5 mg/dL — AB (ref 8.4–10.5)
CHLORIDE: 100 meq/L (ref 96–112)
CO2: 23 mEq/L (ref 19–32)
CO2: 17 mEq/L — ABNORMAL LOW (ref 19–32)
Calcium: 9.5 mg/dL (ref 8.4–10.5)
Chloride: 108 mEq/L (ref 96–112)
Creatinine, Ser: 0.64 mg/dL (ref 0.50–1.10)
Creatinine, Ser: 0.73 mg/dL (ref 0.50–1.10)
GFR calc non Af Amer: >90 mL/min (ref >90)
GLUCOSE: 93 mg/dL (ref 70–99)
GLUCOSE: 87 mg/dL (ref 70–99)
POTASSIUM: 4.5 meq/L (ref 3.7–5.3)
Potassium: 3.9 mEq/L (ref 3.7–5.3)
SODIUM: 140 meq/L (ref 137–147)
Sodium: 138 mEq/L (ref 137–147)
Total Bilirubin: 0.5 mg/dL (ref 0.3–1.2)
Total Protein: 7.2 g/dL (ref 6.0–8.3)
Total Protein: 5.1 g/dL — ABNORMAL LOW (ref 6.0–8.3)

## 2014-05-16 LAB — CBC
HCT: 18.5 % — ABNORMAL LOW (ref 36.0–46.0)
Hemoglobin: 6.6 g/dL — CL (ref 12.0–15.0)
MCH: 30.1 pg (ref 26.0–34.0)
MCHC: 35.7 g/dL (ref 30.0–36.0)
MCV: 84.5 fL (ref 78.0–100.0)
PLATELETS: 16 10*3/uL — AB (ref 150–400)
RBC: 2.19 MIL/uL — ABNORMAL LOW (ref 3.87–5.11)
RDW: 13.2 % (ref 11.5–15.5)
WBC: 0.4 10*3/uL — CL (ref 4.0–10.5)

## 2014-05-16 LAB — TROPONIN I: TROPONIN I: 0.44 ng/mL — AB (ref <0.30)

## 2014-05-16 LAB — FIBRINOGEN: Fibrinogen: 367 mg/dL (ref 204–475)

## 2014-05-16 LAB — APTT: aPTT: 41 seconds — ABNORMAL HIGH (ref 24–37)

## 2014-05-16 LAB — PROTIME-INR
INR: 1.85 — AB (ref 0.00–1.49)
Prothrombin Time: 21.5 seconds — ABNORMAL HIGH (ref 11.6–15.2)

## 2014-05-16 LAB — URINE MICROSCOPIC-ADD ON

## 2014-05-16 LAB — I-STAT CG4 LACTIC ACID, ED: Lactic Acid, Venous: 1.02 mmol/L (ref 0.5–2.2)

## 2014-05-16 LAB — LACTIC ACID, PLASMA: Lactic Acid, Venous: 3.5 mmol/L — ABNORMAL HIGH (ref 0.5–2.2)

## 2014-05-16 LAB — LIPASE, BLOOD: Lipase: 9 U/L — ABNORMAL LOW (ref 11–59)

## 2014-05-16 LAB — PREPARE RBC (CROSSMATCH)

## 2014-05-16 MED ORDER — SODIUM CHLORIDE 0.9 % IV SOLN
500.0000 mg | Freq: Three times a day (TID) | INTRAVENOUS | Status: DC
  Administered 2014-05-17 – 2014-05-18 (×5): 500 mg via INTRAVENOUS
  Filled 2014-05-16 (×7): qty 500

## 2014-05-16 MED ORDER — ONDANSETRON HCL 4 MG/2ML IJ SOLN: 4.0000 mg | Freq: Three times a day (TID) | INTRAMUSCULAR | Status: DC | PRN

## 2014-05-16 MED ORDER — ONDANSETRON HCL 4 MG/2ML IJ SOLN
4.0000 mg | Freq: Once | INTRAMUSCULAR | Status: DC
  Filled 2014-05-16: qty 2

## 2014-05-16 MED ORDER — DEXTROSE 5 % IV SOLN
1.0000 g | Freq: Three times a day (TID) | INTRAVENOUS | Status: DC
  Administered 2014-05-17: 1 g via INTRAVENOUS
  Filled 2014-05-16 (×4): qty 1

## 2014-05-16 MED ORDER — SODIUM CHLORIDE 0.9 % IV SOLN
1000.0000 mL | INTRAVENOUS | Status: DC
  Administered 2014-05-16: 1000 mL via INTRAVENOUS

## 2014-05-16 MED ORDER — SODIUM CHLORIDE 0.9 % IV SOLN
1000.0000 mL | Freq: Once | INTRAVENOUS | Status: AC
  Administered 2014-05-16: 1000 mL via INTRAVENOUS

## 2014-05-16 MED ORDER — HYDROCORTISONE NA SUCCINATE PF 100 MG IJ SOLR
50.0000 mg | Freq: Four times a day (QID) | INTRAMUSCULAR | Status: DC
  Administered 2014-05-16 – 2014-05-18 (×7): 50 mg via INTRAVENOUS
  Filled 2014-05-16 (×7): qty 2

## 2014-05-16 MED ORDER — SODIUM CHLORIDE 0.9 % IV BOLUS (SEPSIS)
1000.0000 mL | Freq: Once | INTRAVENOUS | Status: AC
  Administered 2014-05-16: 1000 mL via INTRAVENOUS

## 2014-05-16 MED ORDER — ACETAMINOPHEN 325 MG PO TABS
650.0000 mg | ORAL_TABLET | Freq: Once | ORAL | Status: AC
  Administered 2014-05-16: 650 mg via ORAL
  Filled 2014-05-16: qty 2

## 2014-05-16 MED ORDER — FENTANYL CITRATE 0.05 MG/ML IJ SOLN
25.0000 ug | INTRAMUSCULAR | Status: DC | PRN
  Administered 2014-05-16 – 2014-05-21 (×7): 25 ug via INTRAVENOUS
  Filled 2014-05-16 (×7): qty 2

## 2014-05-16 MED ORDER — SODIUM CHLORIDE 0.9 % IV BOLUS (SEPSIS)
500.0000 mL | INTRAVENOUS | Status: AC
  Administered 2014-05-16: 500 mL via INTRAVENOUS

## 2014-05-16 MED ORDER — SODIUM CHLORIDE 0.9 % IV SOLN
Freq: Once | INTRAVENOUS | Status: AC
  Administered 2014-05-17: 1000 mL via INTRAVENOUS

## 2014-05-16 MED ORDER — VANCOMYCIN HCL IN DEXTROSE 1-5 GM/200ML-% IV SOLN
1000.0000 mg | Freq: Once | INTRAVENOUS | Status: AC
  Administered 2014-05-16: 1000 mg via INTRAVENOUS
  Filled 2014-05-16: qty 200

## 2014-05-16 MED ORDER — DEXTROSE 5 % IV SOLN: 2.0000 g | Freq: Once | INTRAVENOUS | Status: DC

## 2014-05-16 MED ORDER — CEFEPIME HCL 2 G IJ SOLR
2.0000 g | Freq: Once | INTRAMUSCULAR | Status: AC
  Administered 2014-05-16: 2 g via INTRAVENOUS
  Filled 2014-05-16: qty 2

## 2014-05-16 MED ORDER — SODIUM CHLORIDE 0.9 % IV SOLN
INTRAVENOUS | Status: DC
  Administered 2014-05-16 – 2014-05-18 (×4): via INTRAVENOUS
  Administered 2014-05-19: 1000 mL via INTRAVENOUS
  Administered 2014-05-19 – 2014-05-21 (×4): via INTRAVENOUS

## 2014-05-16 MED ORDER — VASOPRESSIN 20 UNIT/ML IV SOLN
0.0300 [IU]/min | INTRAVENOUS | Status: DC
  Administered 2014-05-17: 0.03 [IU]/min via INTRAVENOUS
  Filled 2014-05-16: qty 2

## 2014-05-16 MED ORDER — DIPHENHYDRAMINE HCL 50 MG/ML IJ SOLN
25.0000 mg | Freq: Four times a day (QID) | INTRAMUSCULAR | Status: DC | PRN
  Administered 2014-05-18 – 2014-05-21 (×2): 25 mg via INTRAVENOUS
  Filled 2014-05-16 (×2): qty 1

## 2014-05-16 MED ORDER — NOREPINEPHRINE BITARTRATE 1 MG/ML IV SOLN
5.0000 ug/min | INTRAVENOUS | Status: DC
  Administered 2014-05-17: 7 ug/min via INTRAVENOUS
  Administered 2014-05-17: 9 ug/min via INTRAVENOUS
  Administered 2014-05-17: 20 ug/min via INTRAVENOUS
  Administered 2014-05-18 – 2014-05-19 (×2): 5 ug/min via INTRAVENOUS
  Administered 2014-05-19: 5.04 ug/min via INTRAVENOUS
  Filled 2014-05-16 (×8): qty 4

## 2014-05-16 MED ORDER — SODIUM CHLORIDE 0.9 % IV SOLN: INTRAVENOUS | Status: DC

## 2014-05-16 MED ORDER — DEXTROSE 5 % IV SOLN
2.0000 ug/min | INTRAVENOUS | Status: DC
  Administered 2014-05-16: 2 ug/min via INTRAVENOUS
  Filled 2014-05-16: qty 4

## 2014-05-16 NOTE — Progress Notes (Addendum)
ANTIBIOTIC CONSULT NOTE - INITIAL  Pharmacy Consult for Vancomycin/Cefepime Indication: HCAP  No Known Allergies  Patient Measurements:   Adjusted Body Weight: n/a  Vital Signs: Temp: 99.3 F (37.4 C) (11/21 1728) Temp Source: Oral (11/21 1728) BP: 70/36 mmHg (11/21 1728) Pulse Rate: 129 (11/21 1728) Intake/Output from previous day:   Intake/Output from this shift: Total I/O In: 2000 [I.V.:2000] Out: 250 [Stool:250]  Labs:  Recent Labs  05/16/14 1307  WBC 0.5*  HGB 8.4*  PLT 25*  CREATININE 0.64   CrCl cannot be calculated (Unknown ideal weight.). No results for input(s): VANCOTROUGH, VANCOPEAK, VANCORANDOM, GENTTROUGH, GENTPEAK, GENTRANDOM, TOBRATROUGH, TOBRAPEAK, TOBRARND, AMIKACINPEAK, AMIKACINTROU, AMIKACIN in the last 72 hours.   Microbiology: No results found for this or any previous visit (from the past 720 hour(s)).  Medical History: Past Medical History  Diagnosis Date  . Urinary tract infection   . Anxiety 2007    SHORT COURSE OF MEDS  . Anemia     TAKING FE  . Infection     UIT X 1  . Infection     TRICH X 1  . Infection     YEAST X1  . 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  . Leukemia   . Ovarian cyst 12-26-2013    Right side    Medications:  Infusions:  . sodium chloride 1,000 mL (05/16/14 1413)  . sodium chloride    . norepinephrine (LEVOPHED) Adult infusion    . norepinephrine (LEVOPHED) Adult infusion    . sodium chloride     Followed by  . sodium chloride    . vancomycin    . vasopressin (PITRESSIN) infusion - *FOR SHOCK*     Anti-infectives    Start     Dose/Rate Route Frequency Ordered Stop   05/16/14 1830  ceFEPIme (MAXIPIME) 2 g in dextrose 5 % 50 mL IVPB  Status:  Discontinued     2 g100 mL/hr over 30 Minutes Intravenous  Once 05/16/14 1816 05/16/14 1836   05/16/14 1830  vancomycin (VANCOCIN) IVPB 1000 mg/200 mL premix     1,000 mg200 mL/hr over 60 Minutes Intravenous  Once 05/16/14 1816     05/16/14  1415  ceFEPIme (MAXIPIME) 2 g in dextrose 5 % 50 mL IVPB     2 g100 mL/hr over 30 Minutes Intravenous  Once 05/16/14 1403 05/16/14 1441     Assessment: 28yoF with AML presenting with fever, abdominal pain.  Last chemo treatment 2 wks ago.  Hypotension/AMS while in ED did not respond to fluids and required Levophed.  Received 2g cefepime in ED at 1400 and vancomycin 1000 mg x 1 at 2100  11/21 >> cefepime >> 11/21 >> vancomycin >>   Tmax 101.5 WBC 0.5; ANC undetectable relative to lymphocytes.  Renal function intact; CrCl > 90 based on previous weight from Aug 2015  11/21 blood: sent  11/21 urine: sent 11/21 sputum: not collected 11/21 CXR shows RLL infiltrate   Goal of Therapy:  Vancomycin trough level 15-20 mcg/ml  Appropriate renal dosing of antibiotics Eradication of infection  Plan:  Vancomycin 500 mg IV q8h starting at 0500 tomorrow Cefepime 1g IV q8h starting at 2200 tonight Measure antibiotic drug levels at steady state Follow up culture results  Reuel Boom, PharmD Pager: [phone] 05/16/2014, 6:38 PM

## 2014-05-16 NOTE — Progress Notes (Signed)
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 Patient presented to the emergency department with complaints of fever, and is leukopenic.  Spoke with Dr. Florene Glen, at [facility], would like to have the patient transferred.  Spoke with Dr. Vanita Panda and he will arrange for transport.  Of note, patient always has soft blood pressure.     Time spent: 45 minutes (examining patient and speaking with oncologist)  Cristal Ford D.O. [facility] Pager [phone]  If 7PM-7AM, please contact night-coverage [email or web address] Password [email or web address] 05/16/2014, 2:54 PM

## 2014-05-16 NOTE — ED Notes (Signed)
Pt is aware that a urine sample is needed.  

## 2014-05-16 NOTE — H&P (Addendum)
PULMONARY / CRITICAL CARE MEDICINE   Name: Carla Haas MRN: [id number] DOB: 1986/01/24    ADMISSION DATE:  05/16/2014 CONSULTATION DATE:  05/16/2014  REFERRING MD :  EDP  CHIEF COMPLAINT:  Hypotension and weakness  INITIAL PRESENTATION: 28 year old with diagnosis of AML who originally presented to the ED with fever and abdominal, last chemo treatment is 2 wks ago.  In the ED was noted to have hypotension and AMS that resolved immediately once BP improved and patient became perfectly awake and appropriate.  Headache seemed to improve with improvement of BP.  CXR performed and was positive for RLL infiltrate.  WBC is 0.5 and platelet are 25.  BP failed to improve with fluid and thus levo was started and PCCM was called to admit.  STUDIES:  11/21 CXR with RLL infiltrate.  SIGNIFICANT EVENTS: 11/21 to ED with PNA and septic shock  PAST MEDICAL HISTORY :   has a past medical history of Urinary tract infection; Anxiety (2007); Anemia; Infection; Infection; Infection; Leukemia, acute lymphoid; Leukemia; and Ovarian cyst (12-26-2013).  has past surgical history that includes Cholecystectomy; choley; Cesarean section (11/20/2011); and Power port (2012). Prior to Admission medications   Medication Sig Start Date End Date Taking? Authorizing Provider  acyclovir (ZOVIRAX) 400 MG tablet Take 1 tablet (400 mg total) by mouth 2 times daily while neutropenic. Your physician will instruct you when to stop taking. 05/07/14  Yes Historical Provider, MD  docusate sodium (STOOL SOFTENER) 100 MG capsule Take 100 mg by mouth daily.  04/30/14  Yes Historical Provider, MD  fluconazole (DIFLUCAN) 200 MG tablet Take 1 tablet (200 mg total) by mouth daily while neutropenic. Your physician will instruct you when to stop taking. 05/07/14  Yes Historical Provider, MD  ibuprofen (ADVIL,MOTRIN) 200 MG tablet Take 400 mg by mouth every 6 (six) hours as needed for moderate pain.   Yes Historical Provider, MD   levofloxacin (LEVAQUIN) 500 MG tablet Take 1 tablet (500 mg total) by mouth daily while neutropenic. Your physician will instruct you when to stop taking. 05/07/14  Yes Historical Provider, MD  magnesium oxide (MAGOX 400) 400 (241.3 MG) MG tablet Take 400 mg by mouth daily.  05/07/14  Yes Historical Provider, MD  pantoprazole (PROTONIX) 40 MG tablet Take 40 mg by mouth daily.  04/30/14  Yes Historical Provider, MD  prochlorperazine (COMPAZINE) 10 MG tablet Take 10 mg by mouth every 6 (six) hours as needed for nausea or vomiting.  05/07/14  Yes Historical Provider, MD   No Known Allergies  FAMILY HISTORY:  indicated that her mother is deceased. She indicated that her brother is alive.  SOCIAL HISTORY:  reports that she has never smoked. She has never used smokeless tobacco. She reports that she uses illicit drugs (Marijuana). She reports that she does not drink alcohol.  REVIEW OF SYSTEMS:  Positive only for headache, dizziness and fever.  Otherwise 12 point ROS is all negative.  SUBJECTIVE: Abdominal pain, fever and dizziness that now resolved.  VITAL SIGNS: Temp:  [99.3 F (37.4 C)-101.5 F (38.6 C)] 99.3 F (37.4 C) (11/21 1728) Pulse Rate:  [70-136] 129 (11/21 1728) Resp:  [16-23] 22 (11/21 1728) BP: (70-98)/(29-63) 70/36 mmHg (11/21 1728) SpO2:  [100 %] 100 % (11/21 1728) HEMODYNAMICS:   VENTILATOR SETTINGS:   INTAKE / OUTPUT:  Intake/Output Summary (Last 24 hours) at 05/16/14 1732 Last data filed at 05/16/14 1541  Gross per 24 hour  Intake   2000 ml  Output  0 ml  Net   2000 ml    PHYSICAL EXAMINATION: General:  Well appearing 28 year old female, NAD. Neuro:  Alert and interactive, moving all ext to command. HEENT:  [place]/AT, PERRL, EOM-I, neck supple and MMM. Cardiovascular:  Tachy, regular, Nl S1/S2, -M/R/G. Lungs:  Mild RLL crackles, otherwise clear. Abdomen:  Soft, NT, ND and +BS. Musculoskeletal:  -edema and -tenderness. Skin:   Intact.  LABS:  CBC  Recent Labs Lab 05/16/14 1307  WBC 0.5*  HGB 8.4*  HCT 24.4*  PLT 25*   Coag's No results for input(s): APTT, INR in the last 168 hours. BMET  Recent Labs Lab 05/16/14 1307  NA 138  K 4.5  CL 100  CO2 23  BUN 15  CREATININE 0.64  GLUCOSE 93   Electrolytes  Recent Labs Lab 05/16/14 1307  CALCIUM 9.5   Sepsis Markers  Recent Labs Lab 05/16/14 1318  LATICACIDVEN 1.02   ABG No results for input(s): PHART, PCO2ART, PO2ART in the last 168 hours. Liver Enzymes  Recent Labs Lab 05/16/14 1307  AST 24  ALT 28  ALKPHOS 108  BILITOT 0.8  ALBUMIN 3.7   Cardiac Enzymes No results for input(s): TROPONINI, PROBNP in the last 168 hours. Glucose No results for input(s): GLUCAP in the last 168 hours.  Imaging No results found.   ASSESSMENT / PLAN:  PULMONARY OETT N/A A: PNA, mild hypoxia. P:   - Supplemental O2. - See ID section. - Reverse isolation.  CARDIOVASCULAR CVL R port, date unknown A: Septic shock due to PNA. P:  - See ID section. - Would like to place TLC for CVP but platelet count of 25. - Hydrate aggressively. - Levophed for pressure support.  RENAL A:  No active issues. P:   - Hydrate. - BMET in AM. - Replace electrolytes as indicated.  GASTROINTESTINAL A:  No active issues. P:   - Monitor.  HEMATOLOGIC A:  AML, s/p chemo 2 wks ago.  Pancytopenic at this point. P:  - Neutropenic precautions. - See ID section. - May need H/O involvement in AM.  INFECTIOUS A:  R PNA with septic shock.  Lactic acid is 1.02 however. P:   BCx2 11/21>>> UC  11/21>>> Sputum  11/21>>> Abx: Vanc/zosyn, start date  11/21>>> Abd/pelvic CT to r/o appendicitis vs pyelo.  ENDOCRINE A:  ?steroid use, patient unsure.   P:   - Cortisol level. - Stress dose steroids.  NEUROLOGIC A:  Denies any headache or neck pain, sleepy but very easily arousable.   P:   - Minimize sedation. - Manage pain. - Monitor  closely.   FAMILY  - Updates: patient updated bedside.  - Inter-disciplinary family meet or Palliative Care meeting due by:    TODAY'S SUMMARY: 28 year old with RLL PNA, immune compromised, treat as HCAP, send cultures, pressor support for septic shock.  Once improved may need to go back to [facility] but will monitor for now.  Also c/o back pain radiating to the front, abdominal CT to r/o appendicitis vs pyelo.  My CC time 35 min.   Rush Farmer, M.D. [facility] Pulmonary/Critical Care Medicine. Pager: [phone]. After hours pager: [phone].  05/16/2014, 5:32 PM

## 2014-05-16 NOTE — ED Notes (Signed)
Pt c/o fever and headache x1 day. Pt has hx of leukemia.  Reported last Chemo treatment x2 week.  Received platelets yesterday at [facility].  Reports pain 6/10.  Fever at this time 100.4.

## 2014-05-16 NOTE — ED Notes (Addendum)
Lab reports WBC 0.5 and platelets 25,000. RN notified.

## 2014-05-16 NOTE — ED Notes (Signed)
Pt states that her ears are ringing.

## 2014-05-16 NOTE — ED Provider Notes (Signed)
ED Sepsis - Repeat Assessment   Performed at:    May 16, 2014, 5:13 PM   Last Vitals:    Blood pressure 72/32, pulse 130, temperature 99.5 F (37.5 C), temperature source Oral, resp. rate 22, last menstrual period 04/27/2014, SpO2 100 %.  Heart:      Tachycardic regular rhythm  Lungs:     CTA RA sat 100% normal  Capillary Refill:   3-4 secs  Peripheral Pulse (include location): Absent radial, weak femoral present   Skin (include color):   Normal for race  CRITICAL CARE Performed by: Babette Relic Total critical care time: 56min Critical care time was exclusive of separately billable procedures and treating other patients. Critical care was necessary to treat or prevent imminent or life-threatening deterioration. Critical care was time spent personally by me on the following activities: development of treatment plan with patient and/or surrogate as well as nursing, discussions with consultants, evaluation of patient's response to treatment, examination of patient, obtaining history from patient or surrogate, ordering and performing treatments and interventions, ordering and review of laboratory studies, ordering and review of radiographic studies, pulse oximetry and re-evaluation of patient's condition.  D/w [facility] cancelled transfer due to instability and PCCM available at [facility], PCCM paged, Pt agrees to access port and ordered Levophed. Pt with emesis and diarrhea starting now in ED. PCCM will see in ED for ICU admit. [place], MD 05/21/14 [phone]

## 2014-05-16 NOTE — ED Notes (Signed)
Hospitalist bedside 

## 2014-05-16 NOTE — ED Notes (Signed)
Pt blood pressure too low to be able to obtain labs. Port being accessed and meds given to make blood pressure go up

## 2014-05-16 NOTE — Progress Notes (Signed)
[place] Progress Note Patient Name: Carla Haas DOB: 01-May-1986 MRN: [id number]   Date of Service  05/16/2014  HPI/Events of Note  hgb down to 6.6  eICU Interventions  trx x 2 units May need plts too      Intervention Category Intermediate Interventions: Bleeding - evaluation and treatment with blood products  Christinia Gully 05/16/2014, 10:21 PM

## 2014-05-16 NOTE — ED Provider Notes (Addendum)
CSN: [id number]     Arrival date & time 05/16/14  1240 History   First MD Initiated Contact with Patient 05/16/14 1247     Chief Complaint  Patient presents with  . Fever  . Headache  . Leukemia      HPI  Patient with a history of leukemia now presents with weakness, nausea, anorexia. Symptoms began over the past days, have progressed since yesterday. Yesterday the patient also received platelets. Today the patient also notes a new fever, with nausea, but no cough, diarrhea, abdominal or chest pain. Last chemotherapy was 2 weeks ago. Since onset of symptoms, no clear alleviating or exacerbating factors. Patient gets her oncology services at [facility].   Past Medical History  Diagnosis Date  . Urinary tract infection   . Anxiety 2007    SHORT COURSE OF MEDS  . Anemia     TAKING FE  . Infection     UIT X 1  . Infection     TRICH X 1  . Infection     YEAST X1  . 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  . Leukemia   . Ovarian cyst 12-26-2013    Right side   Past Surgical History  Procedure Laterality Date  . Cholecystectomy      age 51  . Choley    . Cesarean section  11/20/2011    Procedure: CESAREAN SECTION;  Surgeon: Delice Lesch, MD;  Location: [facility];  Service: Gynecology;  Laterality: N/A;  Primary Cesarean Section Delivery Baby Girl @ 2350  . Power port  2012   Family History  Problem Relation Age of Onset  . Anesthesia problems Neg Hx   . Alcohol abuse Mother   . Lupus Brother    History  Substance Use Topics  . Smoking status: Never Smoker   . Smokeless tobacco: Never Used  . Alcohol Use: No   OB History    Gravida Para Term Preterm AB TAB SAB Ectopic Multiple Living   5 4 3 1 1  0 1 0 0 3     Review of Systems  Constitutional: Positive for fever, diaphoresis, activity change, appetite change and fatigue.  HENT:       Per HPI, otherwise negative  Respiratory:       Per HPI, otherwise negative  Cardiovascular:     Per HPI, otherwise negative  Gastrointestinal: Positive for nausea. Negative for vomiting and abdominal pain.  Endocrine:       Negative aside from HPI  Genitourinary:       Neg aside from HPI   Musculoskeletal:       Per HPI, otherwise negative  Skin: Negative for rash.  Neurological: Positive for weakness. Negative for syncope.      Allergies  Review of patient's allergies indicates no known allergies.  Home Medications   Prior to Admission medications   Not on File   BP 81/46 mmHg  Pulse 131  Temp(Src) 100.4 F (38 C) (Oral)  Resp 17  SpO2 100%  LMP 04/27/2014 Physical Exam  Constitutional: She is oriented to person, place, and time. She appears well-developed and well-nourished. She appears ill.  HENT:  Head: Normocephalic and atraumatic.  Eyes: Conjunctivae and EOM are normal.  Cardiovascular: Regular rhythm.  Tachycardia present.   Pulmonary/Chest: Effort normal and breath sounds normal. No stridor. No respiratory distress.    Abdominal: She exhibits no distension. There is no tenderness.  Musculoskeletal: She exhibits no  edema.  Neurological: She is alert and oriented to person, place, and time. No cranial nerve deficit.  Skin: Skin is warm and dry.  Psychiatric: She has a normal mood and affect.  Nursing note and vitals reviewed.   ED Course  Procedures (including critical care time) Labs Review Labs Reviewed  CBC WITH DIFFERENTIAL - Abnormal; Notable for the following:    WBC 0.5 (*)    RBC 2.84 (*)    Hemoglobin 8.4 (*)    HCT 24.4 (*)    Platelets 25 (*)    Neutrophils Relative % 0 (*)    Lymphocytes Relative 100 (*)    Monocytes Relative 0 (*)    Neutro Abs 0.0 (*)    Lymphs Abs 0.5 (*)    Monocytes Absolute 0.0 (*)    All other components within normal limits  LIPASE, BLOOD - Abnormal; Notable for the following:    Lipase 9 (*)    All other components within normal limits  COMPREHENSIVE METABOLIC PANEL  URINALYSIS, ROUTINE W REFLEX  MICROSCOPIC  I-STAT CG4 LACTIC ACID, ED     HR 130's, stach, abnormal  BP 79/ 45 initially.  IVF running   2:22 PM blood pressure improved 98/65. Labs notable for neutropenia.   2:50 PM Following 2 L of IV fluid resuscitation the patient has increased fever, decreased blood pressure, now with systolic less than 80 again.   Update: Blood pressure slightly improved.  The patient received oncology care at [place] colleagues have discussed her care with her Onc MD, who accepts her for transfer Florene Glen) [place] female with leukemia presents with fever, malaise.  Patient is awake, oriented 3, but generally ill-appearing. Patient has neutropenia, fever.  No clear source.  On admission x-ray was pending.  Patient received empiric cefepime. Following 2 L of fluid patient remained hypotensive.  Fever persisted. Given concern for critical illness, need for ongoing oncologic management, patient transferred to [facility] for further evaluation and management.  CRITICAL CARE Performed by: Carmin Muskrat Total critical care time: 35 Critical care time was exclusive of separately billable procedures and treating other patients. Critical care was necessary to treat or prevent imminent or life-threatening deterioration. Critical care was time spent personally by me on the following activities: development of treatment plan with patient and/or surrogate as well as nursing, discussions with consultants, evaluation of patient's response to treatment, examination of patient, obtaining history from patient or surrogate, ordering and performing treatments and interventions, ordering and review of laboratory studies, ordering and review of radiographic studies, pulse oximetry and re-evaluation of patient's condition.    Carmin Muskrat, MD 05/16/14 1452

## 2014-05-16 NOTE — ED Notes (Signed)
Patient transported to X-ray 

## 2014-05-16 NOTE — ED Notes (Signed)
Pt on bedside commode at this time with diarrhea.

## 2014-05-16 NOTE — ED Notes (Addendum)
Pt on beside commode with diarrhea.

## 2014-05-16 NOTE — ED Notes (Signed)
Phone call complete with Dawn, transfer nurse at [facility]. Reported pt was going to [place] but has not been assigned a room at this time. Will have receiving nurse call to get report once pt is assigned room.

## 2014-05-16 NOTE — ED Notes (Signed)
MD at bedside. 

## 2014-05-17 ENCOUNTER — Encounter (HOSPITAL_COMMUNITY): Payer: Self-pay | Admitting: Pulmonary Disease

## 2014-05-17 ENCOUNTER — Inpatient Hospital Stay (HOSPITAL_COMMUNITY): Payer: Medicare Other

## 2014-05-17 DIAGNOSIS — A419 Sepsis, unspecified organism: Secondary | ICD-10-CM | POA: Insufficient documentation

## 2014-05-17 DIAGNOSIS — C9100 Acute lymphoblastic leukemia not having achieved remission: Secondary | ICD-10-CM | POA: Diagnosis present

## 2014-05-17 DIAGNOSIS — T451X5A Adverse effect of antineoplastic and immunosuppressive drugs, initial encounter: Secondary | ICD-10-CM | POA: Diagnosis present

## 2014-05-17 DIAGNOSIS — J189 Pneumonia, unspecified organism: Secondary | ICD-10-CM

## 2014-05-17 DIAGNOSIS — R6521 Severe sepsis with septic shock: Secondary | ICD-10-CM

## 2014-05-17 DIAGNOSIS — C9200 Acute myeloblastic leukemia, not having achieved remission: Secondary | ICD-10-CM

## 2014-05-17 DIAGNOSIS — D61810 Antineoplastic chemotherapy induced pancytopenia: Secondary | ICD-10-CM | POA: Diagnosis present

## 2014-05-17 LAB — CBC WITH DIFFERENTIAL/PLATELET
BASOS ABS: 0 10*3/uL (ref 0.0–0.1)
Basophils Relative: 0 % (ref 0–1)
Eosinophils Absolute: 0 10*3/uL (ref 0.0–0.7)
Eosinophils Relative: 0 % (ref 0–5)
HEMATOCRIT: 26.3 % — AB (ref 36.0–46.0)
HEMOGLOBIN: 9.7 g/dL — AB (ref 12.0–15.0)
LYMPHS PCT: 99 % — AB (ref 12–46)
Lymphs Abs: 0.7 10*3/uL (ref 0.7–4.0)
MCH: 29.5 pg (ref 26.0–34.0)
MCHC: 36.9 g/dL — ABNORMAL HIGH (ref 30.0–36.0)
MCV: 79.9 fL (ref 78.0–100.0)
MONOS PCT: 0 % — AB (ref 3–12)
Monocytes Absolute: 0 10*3/uL — ABNORMAL LOW (ref 0.1–1.0)
Neutro Abs: 0 10*3/uL — ABNORMAL LOW (ref 1.7–7.7)
Neutrophils Relative %: 1 % — ABNORMAL LOW (ref 43–77)
Platelets: 12 10*3/uL — CL (ref 150–400)
RBC: 3.29 MIL/uL — AB (ref 3.87–5.11)
RDW: 13.4 % (ref 11.5–15.5)
WBC: 0.7 10*3/uL — CL (ref 4.0–10.5)

## 2014-05-17 LAB — CBC
HEMATOCRIT: 19.6 % — AB (ref 36.0–46.0)
HEMOGLOBIN: 6.9 g/dL — AB (ref 12.0–15.0)
MCH: 29.5 pg (ref 26.0–34.0)
MCHC: 35.2 g/dL (ref 30.0–36.0)
MCV: 83.8 fL (ref 78.0–100.0)
Platelets: 20 10*3/uL — CL (ref 150–400)
RBC: 2.34 MIL/uL — ABNORMAL LOW (ref 3.87–5.11)
RDW: 13.2 % (ref 11.5–15.5)
WBC: 0.6 10*3/uL — CL (ref 4.0–10.5)

## 2014-05-17 LAB — BASIC METABOLIC PANEL
ANION GAP: 12 (ref 5–15)
BUN: 8 mg/dL (ref 6–23)
CO2: 17 mEq/L — ABNORMAL LOW (ref 19–32)
CREATININE: 0.47 mg/dL — AB (ref 0.50–1.10)
Calcium: 7.5 mg/dL — ABNORMAL LOW (ref 8.4–10.5)
Chloride: 105 mEq/L (ref 96–112)
GFR calc Af Amer: >90 mL/min (ref >90)
GFR calc non Af Amer: >90 mL/min (ref >90)
Glucose, Bld: 158 mg/dL — ABNORMAL HIGH (ref 70–99)
Potassium: 3.5 mEq/L — ABNORMAL LOW (ref 3.7–5.3)
Sodium: 134 mEq/L — ABNORMAL LOW (ref 137–147)

## 2014-05-17 LAB — EXPECTORATED SPUTUM ASSESSMENT W REFEX TO RESP CULTURE

## 2014-05-17 LAB — LACTIC ACID, PLASMA: LACTIC ACID, VENOUS: 3.1 mmol/L — AB (ref 0.5–2.2)

## 2014-05-17 LAB — MRSA PCR SCREENING: MRSA by PCR: NEGATIVE

## 2014-05-17 LAB — EXPECTORATED SPUTUM ASSESSMENT W GRAM STAIN, RFLX TO RESP C

## 2014-05-17 LAB — CLOSTRIDIUM DIFFICILE BY PCR: CDIFFPCR: POSITIVE — AB

## 2014-05-17 LAB — CORTISOL: CORTISOL PLASMA: 148.3 ug/dL

## 2014-05-17 MED ORDER — DEXTROSE 5 % IV SOLN
2.0000 g | Freq: Three times a day (TID) | INTRAVENOUS | Status: DC
  Administered 2014-05-17 – 2014-05-22 (×16): 2 g via INTRAVENOUS
  Filled 2014-05-17 (×17): qty 2

## 2014-05-17 MED ORDER — METRONIDAZOLE 500 MG PO TABS
500.0000 mg | ORAL_TABLET | Freq: Three times a day (TID) | ORAL | Status: DC
  Administered 2014-05-17 – 2014-05-21 (×12): 500 mg via ORAL
  Filled 2014-05-17 (×12): qty 1

## 2014-05-17 MED ORDER — LABETALOL HCL 5 MG/ML IV SOLN: 10.0000 mg | INTRAVENOUS | Status: DC | PRN

## 2014-05-17 MED ORDER — ACETAMINOPHEN 325 MG PO TABS
650.0000 mg | ORAL_TABLET | Freq: Four times a day (QID) | ORAL | Status: DC | PRN
  Administered 2014-05-17 – 2014-05-22 (×17): 650 mg via ORAL
  Filled 2014-05-17 (×16): qty 2

## 2014-05-17 MED ORDER — ACETAMINOPHEN 325 MG PO TABS
ORAL_TABLET | ORAL | Status: AC
  Administered 2014-05-17: 650 mg
  Filled 2014-05-17: qty 2

## 2014-05-17 MED ORDER — ONDANSETRON HCL 4 MG/2ML IJ SOLN
4.0000 mg | Freq: Four times a day (QID) | INTRAMUSCULAR | Status: DC | PRN
  Administered 2014-05-18: 4 mg via INTRAVENOUS

## 2014-05-17 MED ORDER — METRONIDAZOLE 50 MG/ML ORAL SUSPENSION
500.0000 mg | Freq: Three times a day (TID) | ORAL | Status: DC
Start: 2014-05-17 — End: 2014-05-17
  Filled 2014-05-17: qty 10

## 2014-05-17 MED ORDER — LABETALOL HCL 5 MG/ML IV SOLN
INTRAVENOUS | Status: AC
  Administered 2014-05-17: 10 mg
  Filled 2014-05-17: qty 4

## 2014-05-17 NOTE — Progress Notes (Signed)
[place] Progress Note Patient Name: DEMARA LOVER DOB: 09-27-1985 MRN: [id number]   Date of Service  05/17/2014  HPI/Events of Note  c diff positive  eICU Interventions  Oral flagyl started.       Intervention Category Intermediate Interventions: Infection - evaluation and management  Mauri [place], P 05/17/2014, 8:44 PM

## 2014-05-17 NOTE — Progress Notes (Signed)
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  She is followed at [facility] by heme/onc.  STUDIES:  11/21 CT abd/pelvis >> RLL consolidation, patch LLL ASD, mod hiatal hernia, b/l non obstructive renal stones  SIGNIFICANT EVENTS: 11/20 PLT transfusion at [facility] 11/21 to ED with PNA and septic shock, transfuse 2 units PRBC for Hb 6.6  SUBJECTIVE: She was on pressors last night >> BP went up, and pressors d/c'ed >> she was given labetalol >> BP went down and back on levophed.  She still has cough with green sputum.  Chest/abd pain better.  Feels chilled.  Denies nausea.  Feels sleepy.  VITAL SIGNS: Temp:  [97.7 F (36.5 C)-104 F (40 C)] 101.8 F (38.8 C) (11/22 0800) Pulse Rate:  [70-136] 99 (11/22 0800) Resp:  [5-37] 30 (11/22 0800) BP: (54-195)/(17-147) 96/69 mmHg (11/22 0800) SpO2:  [94 %-100 %] 100 % (11/22 0800) Weight:  [102 lb 8.2 oz (46.5 kg)] 102 lb 8.2 oz (46.5 kg) (11/21 2318) INTAKE / OUTPUT:  Intake/Output Summary (Last 24 hours) at 05/17/14 0836 Last data filed at 05/17/14 0600  Gross per 24 hour  Intake 6522.38 ml  Output   1300 ml  Net 5222.38 ml    PHYSICAL EXAMINATION: General: no distress Neuro: sleepy, wakes up easily, normal strength HEENT: no sinus tenderness Cardiovascular: regular, tachycardic, no murmur Lungs: faint basilar rales Abdomen: soft, non tender Musculoskeletal: no edema Skin: no rashes  LABS:  CBC  Recent Labs Lab 05/16/14 1307 05/16/14 2100 05/16/14 2110  WBC 0.5* 0.6* 0.4*  HGB 8.4* 6.9* 6.6*  HCT 24.4* 19.6* 18.5*  PLT 25* 20* 16*    Coag's  Recent Labs Lab 05/16/14 1956  APTT 41*  INR 1.85*   BMET  Recent Labs Lab 05/16/14 1307 05/16/14 1956  NA 138 140  K 4.5 3.9  CL 100 108  CO2 23 17*  BUN 15 18  CREATININE 0.64 0.73  GLUCOSE 93 87   Electrolytes  Recent Labs Lab 05/16/14 1307 05/16/14 1956  CALCIUM 9.5 7.5*   Sepsis Markers  Recent Labs Lab 05/16/14 1318 05/16/14 1956 05/16/14 2115  LATICACIDVEN 1.02 3.5* 3.1*   Liver Enzymes  Recent Labs Lab 05/16/14 1307 05/16/14 1956  AST 24 19  ALT 28 20  ALKPHOS 108 76  BILITOT 0.8 0.5  ALBUMIN 3.7 2.5*   Cardiac Enzymes  Recent Labs Lab 05/16/14 1956  TROPONINI 0.44*   Imaging Ct Abdomen Pelvis Wo Contrast  05/16/2014   CLINICAL DATA:  28 year old female with AML, abdominal pain and fever.  EXAM: CT ABDOMEN AND PELVIS WITHOUT CONTRAST  TECHNIQUE: Multidetector CT imaging of the abdomen and pelvis was performed following the standard protocol without IV contrast.  COMPARISON:  05/16/2014 chest radiograph  FINDINGS: Posterior right lower lobe consolidation is compatible with pneumonia.  Patchy airspace opacities within the left lower lobe are identified also compatible with pneumonia.  A moderate hiatal hernia is noted.  The liver, spleen, pancreas, and adrenal glands are unremarkable.  Nonobstructing bilateral renal calculi are identified including 4 mm and 2 mm right lower pole calculi and 2 punctate left lower pole calculi. There  is no evidence of hydronephrosis.  The patient is status post cholecystectomy.  Please note that parenchymal abnormalities may be missed without intravenous contrast.  A tiny amount of ascites within the upper abdomen noted.  There is no evidence of biliary dilatation, abdominal aortic aneurysm or enlarged lymph nodes.  The bowel and bladder are unremarkable. There is no evidence of bowel obstruction or pneumoperitoneum.  No acute or suspicious bony abnormalities are identified.  IMPRESSION: Bilateral lower  lobe pneumonia.  Tiny amount of upper abdominal ascites.  Nonobstructing bilateral renal calculi.  Moderate hiatal hernia.   Electronically Signed   By: Hassan Rowan M.D.   On: 05/16/2014 20:40   Dg Chest 2 View  05/16/2014   CLINICAL DATA:  Fever today.  History of leukemia.  EXAM: CHEST  2 VIEW  COMPARISON:  12/25/2013 and 09/18/2012 as well as 12/17/2011.  FINDINGS: Right IJ central venous catheter is unchanged with tip in the region of the cavoatrial junction. Lungs are adequately inflated with mild focal opacification over the posterior right base suggesting early pneumonia. No evidence of effusion. Cardiomediastinal silhouette and remainder of the exam is unchanged.  IMPRESSION: Opacification over the posterior right base suggesting early pneumonia.   Electronically Signed   By: Marin Olp M.D.   On: 05/16/2014 15:21     ASSESSMENT / PLAN:  PULMONARY A:  Acute hypoxic respiratory failure 2nd to PNA. P:   F/u CXR Oxygen to keep SpO2 > 92%  CARDIOVASCULAR Rt chest port A:  Septic shock. P:  Pressors to keep MAP > 65, SBP > 90 Continue IV fluids  RENAL A:   Mild non anion gap acidosis. P:   F/u BMET  GASTROINTESTINAL A:   Abdominal pain. P:   Advance diet as tolerated  HEMATOLOGIC A:   Chemo induced pancytopenia in setting of acute lymphoblastic leukemia. P:  F/u CBC SCD's for DVT prophylaxis  INFECTIOUS A: Septic shock from neutropenic fever and PNA. P:   Day 2 vancomycin, cefepime >> started 11/21  Blood cx 11/21 >> Urine cx 11/21 >>  Respiratory viral panel 11/22 >>   ENDOCRINE A:  Relative adrenal insufficiency. P:   Solu-cortef started 11/21 F/u cortisol level  NEUROLOGIC A:   Acute encephalopathy 2nd to septic shock. P:   Monitor  FAMILY  - Updates:  - Inter-disciplinary family meet or Palliative Care meeting due by 05/23/14:    TODAY'S SUMMARY:  Continue current Abx.  Wean pressors as tolerated.  Will need to consider transfer to [facility] when she is stable for transfer (apparently felt to be too unstable for transfer on 11/21).  CC time 40 minutes.  Chesley Mires, MD [facility] Pulmonary/Critical Care 05/17/2014, 8:36 AM Pager:  [phone] After 3pm call: [phone]

## 2014-05-17 NOTE — Plan of Care (Signed)
Problem: Phase I Progression Outcomes Goal: Initial discharge plan identified Outcome: Completed/Met Date Met:  05/17/14 Goal: Other Phase I Outcomes/Goals Outcome: Completed/Met Date Met:  05/17/14

## 2014-05-17 NOTE — Progress Notes (Signed)
PULMONARY / CRITICAL CARE MEDICINE   Name: Carla Haas MRN: [id number] DOB: 1985/07/05    ADMISSION DATE:  05/16/2014  REFERRING MD :  EDP  CHIEF COMPLAINT:  Hypotension and weakness  INITIAL PRESENTATION:  28 yo female presented with fever, abdominal pain, headache, hypotension from PNA in setting of AML s/p chemo 2 weeks prior to admission with chemo induced pancytopenia.  She is followed at [facility] by heme/onc.  STUDIES:  11/21 CT abd/pelvis >> RLL consolidation, patch LLL ASD, mod hiatal hernia, b/l non obstructive renal stones  SIGNIFICANT EVENTS: 11/20 PLT transfusion at [facility] 11/21 to ED with PNA and septic shock, transfuse 2 units PRBC for Hb 6.6  SUBJECTIVE: She was on pressors last night >> BP went up, and pressors d/c'ed >> she was given labetalol >> BP went down and back on levophed.  She still has cough with green sputum.  Chest/abd pain better.  Feels chilled.  Denies nausea.  Feels sleepy.  VITAL SIGNS: Temp:  [97.7 F (36.5 C)-104 F (40 C)] 101.3 F (38.5 C) (11/22 0630) Pulse Rate:  [70-136] 99 (11/22 0630) Resp:  [5-37] 24 (11/22 0630) BP: (54-195)/(17-147) 102/71 mmHg (11/22 0630) SpO2:  [94 %-100 %] 98 % (11/22 0630) Weight:  [102 lb 8.2 oz (46.5 kg)] 102 lb 8.2 oz (46.5 kg) (11/21 2318) INTAKE / OUTPUT:  Intake/Output Summary (Last 24 hours) at 05/17/14 0747 Last data filed at 05/17/14 0600  Gross per 24 hour  Intake 6522.38 ml  Output   1300 ml  Net 5222.38 ml    PHYSICAL EXAMINATION: General: no distress Neuro: sleepy, wakes up easily, normal strength HEENT: no sinus tenderness Cardiovascular: regular, tachycardic, no murmur Lungs: faint basilar rales Abdomen: soft, non tender Musculoskeletal: no edema Skin: no rashes  LABS:  CBC  Recent Labs Lab 05/16/14 1307 05/16/14 2100 05/16/14 2110  WBC 0.5* 0.6* 0.4*  HGB 8.4* 6.9* 6.6*  HCT 24.4* 19.6* 18.5*  PLT 25* 20* 16*   Coag's  Recent Labs Lab  05/16/14 1956  APTT 41*  INR 1.85*   BMET  Recent Labs Lab 05/16/14 1307 05/16/14 1956  NA 138 140  K 4.5 3.9  CL 100 108  CO2 23 17*  BUN 15 18  CREATININE 0.64 0.73  GLUCOSE 93 87   Electrolytes  Recent Labs Lab 05/16/14 1307 05/16/14 1956  CALCIUM 9.5 7.5*   Sepsis Markers  Recent Labs Lab 05/16/14 1318 05/16/14 1956 05/16/14 2115  LATICACIDVEN 1.02 3.5* 3.1*   Liver Enzymes  Recent Labs Lab 05/16/14 1307 05/16/14 1956  AST 24 19  ALT 28 20  ALKPHOS 108 76  BILITOT 0.8 0.5  ALBUMIN 3.7 2.5*   Cardiac Enzymes  Recent Labs Lab 05/16/14 1956  TROPONINI 0.44*   Imaging Ct Abdomen Pelvis Wo Contrast  05/16/2014   CLINICAL DATA:  28 year old female with AML, abdominal pain and fever.  EXAM: CT ABDOMEN AND PELVIS WITHOUT CONTRAST  TECHNIQUE: Multidetector CT imaging of the abdomen and pelvis was performed following the standard protocol without IV contrast.  COMPARISON:  05/16/2014 chest radiograph  FINDINGS: Posterior right lower lobe consolidation is compatible with pneumonia.  Patchy airspace opacities within the left lower lobe are identified also compatible with pneumonia.  A moderate hiatal hernia is noted.  The liver, spleen, pancreas, and adrenal glands are unremarkable.  Nonobstructing bilateral renal calculi are identified including 4 mm and 2 mm right lower pole calculi and 2 punctate left lower pole calculi. There is no  evidence of hydronephrosis.  The patient is status post cholecystectomy.  Please note that parenchymal abnormalities may be missed without intravenous contrast.  A tiny amount of ascites within the upper abdomen noted.  There is no evidence of biliary dilatation, abdominal aortic aneurysm or enlarged lymph nodes.  The bowel and bladder are unremarkable. There is no evidence of bowel obstruction or pneumoperitoneum.  No acute or suspicious bony abnormalities are identified.  IMPRESSION: Bilateral lower lobe pneumonia.  Tiny amount of  upper abdominal ascites.  Nonobstructing bilateral renal calculi.  Moderate hiatal hernia.   Electronically Signed   By: Hassan Rowan M.D.   On: 05/16/2014 20:40   Dg Chest 2 View  05/16/2014   CLINICAL DATA:  Fever today.  History of leukemia.  EXAM: CHEST  2 VIEW  COMPARISON:  12/25/2013 and 09/18/2012 as well as 12/17/2011.  FINDINGS: Right IJ central venous catheter is unchanged with tip in the region of the cavoatrial junction. Lungs are adequately inflated with mild focal opacification over the posterior right base suggesting early pneumonia. No evidence of effusion. Cardiomediastinal silhouette and remainder of the exam is unchanged.  IMPRESSION: Opacification over the posterior right base suggesting early pneumonia.   Electronically Signed   By: Marin Olp M.D.   On: 05/16/2014 15:21     ASSESSMENT / PLAN:  PULMONARY A:  Acute hypoxic respiratory failure 2nd to PNA. P:   F/u CXR Oxygen to keep SpO2 > 92%  CARDIOVASCULAR Rt chest port A:  Septic shock. P:  Pressors to keep MAP > 65, SBP > 90 Continue IV fluids  RENAL A:   Mild non anion gap acidosis. P:   F/u BMET  GASTROINTESTINAL A:   Abdominal pain. P:   Advance diet as tolerated  HEMATOLOGIC A:   Chemo induced pancytopenia in setting of AML. P:  F/u CBC SCD's for DVT prophylaxis  INFECTIOUS A: Septic shock from neutropenic fever and PNA. P:   Day 2 vancomycin, cefepime >> started 11/21  Blood cx 11/21 >> Urine cx 11/21 >>  Respiratory viral panel 11/22 >>   ENDOCRINE A:  Relative adrenal insufficiency. P:   Solu-cortef started 11/21 F/u cortisol level  NEUROLOGIC A:   Acute encephalopathy 2nd to septic shock. P:   Monitor  FAMILY  - Updates:  - Inter-disciplinary family meet or Palliative Care meeting due by 05/23/14:    TODAY'S SUMMARY:  Continue current Abx.  Wean pressors as tolerated.  Will need to consider transfer to [facility] when she is stable for transfer (apparently felt to  be too unstable for transfer on 11/21).  CC time 40 minutes.  Chesley Mires, MD [facility] Pulmonary/Critical Care 05/17/2014, 8:26 AM Pager:  [phone] After 3pm call: [phone]

## 2014-05-17 NOTE — Progress Notes (Signed)
10mg  Labatolol mg given

## 2014-05-17 NOTE — Progress Notes (Signed)
Levophed reduced to 5 mcq, Neo stopped

## 2014-05-17 NOTE — Progress Notes (Signed)
Levophed stopped

## 2014-05-17 NOTE — Progress Notes (Signed)
CRITICAL VALUE ALERT  Critical value received:  Platelets 12   Date of notification:  05/17/14          Time of notification:  5379  Critical value read back: Yes  Nurse who received alert:  Chrys Racer, RN  MD notified (1st page):  Dr. Halford Chessman  Time of first page:  1015  MD notified (2nd page): N/A  Time of second page: N/A  Responding MD:  Dr. Halford Chessman  Time MD responded:  [phone]

## 2014-05-17 NOTE — Progress Notes (Signed)
650 mg Tylenol given

## 2014-05-17 NOTE — Progress Notes (Signed)
Consulted with Dr. Jimmy Footman, Warren Lacy regarding pt's temp of 102.38 and blood transfusion ordered by Dr. Melvyn Novas. Advised to proceed.

## 2014-05-17 NOTE — Progress Notes (Signed)
Levophed reduced to 20 mcq

## 2014-05-17 NOTE — Progress Notes (Signed)
Levophed reduced to 74mcq

## 2014-05-17 NOTE — Progress Notes (Signed)
ANTIBIOTIC CONSULT NOTE - FOLLOW UP  Pharmacy Consult for Vancomycin and Cefepime Indication: Sepsis, PNA, febrile neutropenia  No Known Allergies  Patient Measurements: Height: 4\' 11"  (149.9 cm) Weight: 102 lb 8.2 oz (46.5 kg) IBW/kg (Calculated) : 43.2  Vital Signs: Temp: 100.8 F (38.2 C) (11/22 1100) Temp Source: Core (Comment) (11/22 0615) BP: 87/57 mmHg (11/22 1100) Pulse Rate: 93 (11/22 1100) Intake/Output from previous day: 11/21 0701 - 11/22 0700 In: 6567.4 [I.V.:5887.4; Blood:330; IV [id number]:250] Out: 1300 [Urine:1050; Stool:250] Intake/Output from this shift: Total I/O In: 645.1 [I.V.:595.1; IV Piggyback:50] Out: 625 [Urine:625]  Labs:  Recent Labs  05/16/14 1307 05/16/14 1956 05/16/14 2100 05/16/14 2110 05/17/14 0900  WBC 0.5*  --  0.6* 0.4* 0.7*  HGB 8.4*  --  6.9* 6.6* 9.7*  PLT 25*  --  20* 16* 12*  CREATININE 0.64 0.73  --   --  0.47*   Estimated Creatinine Clearance: 71.4 mL/min (by C-G formula based on Cr of 0.47). No results for input(s): VANCOTROUGH, VANCOPEAK, VANCORANDOM, GENTTROUGH, GENTPEAK, GENTRANDOM, TOBRATROUGH, TOBRAPEAK, TOBRARND, AMIKACINPEAK, AMIKACINTROU, AMIKACIN in the last 72 hours.   Assessment: 28yoF with AML presenting with fever, abdominal pain. Last chemo treatment 2 wks ago. Hypotension/AMS while in ED did not respond to fluids and required Levophed. Received 2g cefepime in ED at 1400; ordered vancomycin 1000 mg x 1 for 1830.   11/21 >> cefepime >> 11/21 >> vancomycin >>   Tmax 101.7 WBC 0.7; ANC = 0  Renal: SCr low, CrCl 71 CG, >100 N - overestimate d/t low weight Lactate: 3.5 > 3.1  11/21 blood: sent  11/21 urine: sent 11/21 sputum: not collected 11/21 CXR shows RLL infiltrate 11/22 RSV panel: sent 11/22 Cdiff: sent  Drug level info/dosing changes 11/22 increase cefepime 2g q8h for febrile neutropenia  Goal of Therapy:  Vancomycin trough level 15-20 mcg/ml  Cefepime dose for indication/renal  function  Plan:   Increase cefepime to 2g IV q8h  Continue vancomycin 500mg  IV q8h - plan to check trough tomorrow at steady state Follow up renal function & cultures, clinical course  Peggyann Juba, PharmD, BCPS Pager: [phone] 05/17/2014,11:46 AM

## 2014-05-18 LAB — RESPIRATORY VIRUS PANEL
Adenovirus: NOT DETECTED
INFLUENZA A H1: NOT DETECTED
Influenza A H3: NOT DETECTED
Influenza A: NOT DETECTED
Influenza B: NOT DETECTED
Metapneumovirus: NOT DETECTED
PARAINFLUENZA 2 A: NOT DETECTED
Parainfluenza 1: NOT DETECTED
Parainfluenza 3: NOT DETECTED
RESPIRATORY SYNCYTIAL VIRUS B: NOT DETECTED
Respiratory Syncytial Virus A: NOT DETECTED
Rhinovirus: DETECTED — AB

## 2014-05-18 LAB — CBC WITH DIFFERENTIAL/PLATELET
Basophils Absolute: 0 10*3/uL (ref 0.0–0.1)
Basophils Relative: 0 % (ref 0–1)
EOS PCT: 0 % (ref 0–5)
Eosinophils Absolute: 0 10*3/uL (ref 0.0–0.7)
HEMATOCRIT: 22.8 % — AB (ref 36.0–46.0)
Hemoglobin: 8.3 g/dL — ABNORMAL LOW (ref 12.0–15.0)
Lymphocytes Relative: 96 % — ABNORMAL HIGH (ref 12–46)
Lymphs Abs: 0.3 10*3/uL — ABNORMAL LOW (ref 0.7–4.0)
MCH: 29 pg (ref 26.0–34.0)
MCHC: 36.4 g/dL — ABNORMAL HIGH (ref 30.0–36.0)
MCV: 79.7 fL (ref 78.0–100.0)
MONOS PCT: 0 % — AB (ref 3–12)
Monocytes Absolute: 0 10*3/uL — ABNORMAL LOW (ref 0.1–1.0)
NEUTROS ABS: 0 10*3/uL — AB (ref 1.7–7.7)
Neutrophils Relative %: 4 % — ABNORMAL LOW (ref 43–77)
Platelets: 6 10*3/uL — CL (ref 150–400)
RBC: 2.86 MIL/uL — AB (ref 3.87–5.11)
RDW: 13.7 % (ref 11.5–15.5)
WBC: 0.3 10*3/uL — AB (ref 4.0–10.5)

## 2014-05-18 LAB — BASIC METABOLIC PANEL
Anion gap: 11 (ref 5–15)
BUN: 6 mg/dL (ref 6–23)
CHLORIDE: 108 meq/L (ref 96–112)
CO2: 19 meq/L (ref 19–32)
CREATININE: 0.41 mg/dL — AB (ref 0.50–1.10)
Calcium: 7.6 mg/dL — ABNORMAL LOW (ref 8.4–10.5)
GFR calc Af Amer: >90 mL/min (ref >90)
GFR calc non Af Amer: >90 mL/min (ref >90)
GLUCOSE: 129 mg/dL — AB (ref 70–99)
Potassium: 2.7 mEq/L — CL (ref 3.7–5.3)
Sodium: 138 mEq/L (ref 137–147)

## 2014-05-18 LAB — TYPE AND SCREEN
ABO/RH(D): A POS
ANTIBODY SCREEN: NEGATIVE
Unit division: 0
Unit division: 0

## 2014-05-18 LAB — URINE CULTURE
CULTURE: NO GROWTH
Colony Count: NO GROWTH

## 2014-05-18 LAB — PROTIME-INR
INR: 2.08 — ABNORMAL HIGH (ref 0.00–1.49)
Prothrombin Time: 23.6 seconds — ABNORMAL HIGH (ref 11.6–15.2)

## 2014-05-18 LAB — VANCOMYCIN, TROUGH: Vancomycin Tr: 7.2 ug/mL — ABNORMAL LOW (ref 10.0–20.0)

## 2014-05-18 MED ORDER — POTASSIUM CHLORIDE CRYS ER 20 MEQ PO TBCR
40.0000 meq | EXTENDED_RELEASE_TABLET | Freq: Once | ORAL | Status: AC
  Administered 2014-05-18: 40 meq via ORAL
  Filled 2014-05-18: qty 2

## 2014-05-18 MED ORDER — SODIUM CHLORIDE 0.9 % IV SOLN: Freq: Once | INTRAVENOUS | Status: DC

## 2014-05-18 MED ORDER — FLUCONAZOLE 100 MG PO TABS
200.0000 mg | ORAL_TABLET | Freq: Every day | ORAL | Status: DC
  Administered 2014-05-18 – 2014-05-22 (×5): 200 mg via ORAL
  Filled 2014-05-18 (×5): qty 2

## 2014-05-18 MED ORDER — VANCOMYCIN 50 MG/ML ORAL SOLUTION
125.0000 mg | Freq: Four times a day (QID) | ORAL | Status: DC
  Administered 2014-05-18 – 2014-05-21 (×12): 125 mg via ORAL
  Filled 2014-05-18 (×16): qty 2.5

## 2014-05-18 MED ORDER — VANCOMYCIN HCL IN DEXTROSE 1-5 GM/200ML-% IV SOLN
1000.0000 mg | Freq: Three times a day (TID) | INTRAVENOUS | Status: DC
  Administered 2014-05-18 – 2014-05-20 (×5): 1000 mg via INTRAVENOUS
  Filled 2014-05-18 (×5): qty 200

## 2014-05-18 MED ORDER — ACYCLOVIR 200 MG PO CAPS
400.0000 mg | ORAL_CAPSULE | Freq: Every day | ORAL | Status: DC
  Administered 2014-05-18 – 2014-05-22 (×5): 400 mg via ORAL
  Filled 2014-05-18 (×5): qty 2

## 2014-05-18 MED ORDER — POTASSIUM CHLORIDE 10 MEQ/50ML IV SOLN
10.0000 meq | INTRAVENOUS | Status: AC
  Administered 2014-05-18 (×6): 10 meq via INTRAVENOUS
  Filled 2014-05-18 (×6): qty 50

## 2014-05-18 NOTE — Plan of Care (Signed)
Problem: Phase I Progression Outcomes Goal: OOB as tolerated unless otherwise ordered Outcome: Completed/Met Date Met:  05/18/14     

## 2014-05-18 NOTE — Progress Notes (Signed)
ANTIBIOTIC CONSULT NOTE - FOLLOW UP  Pharmacy Consult for Vancomycin and Cefepime Indication: Sepsis, PNA, febrile neutropenia  No Known Allergies  Patient Measurements: Height: 4\' 11"  (149.9 cm) Weight: 102 lb 8.2 oz (46.5 kg) IBW/kg (Calculated) : 43.2   Assessment: 28yoF with AML presenting with fever, abdominal pain. Last chemo treatment 2 wks ago. Hypotension/AMS while in ED did not respond to fluids and required Levophed. Received 2g cefepime in ED at 1400; ordered vancomycin 1000 mg x 1 for 1830.  Started on levaquin 11/21-not resumed. Also on fluconazole and acyclovir PTA while neutropenic.  11/21 >> cefepime >> 11/21 >> vancomycin >>  11/22 >> Flagyl/Vanc po >> 11/23 >> Acyclovir as PTA >> 11/23 >> Fluconazole as PTA >>  Tmax 101.8 WBC 0.3; ANC = 0  Renal: SCr low, CrCl 71 CG, >100 N - overestimate d/t low weight Lactate: 3.5 > 3.1  11/21 blood: ngtd 11/21 urine: ng-final 11/22 sputum: pending, aceptable 11/21 CXR shows RLL infiltrate 11/22 RSV panel: pending 11/22 Cdiff: positive 11/21 PCR: neg for MRSA  Drug level info/dosing changes 11/22 increase cefepime 2g q8h for febrile neutropenia 11/23 Vanc trough @ 2000 = 7.2 mcg/mL on 500mg  q8h, incr to 1g q8h  Goal of Therapy:  Vancomycin trough level 15-20 mcg/ml  Cefepime dose for indication/renal function  Plan:   Continue cefepime to 2g IV q8h  Increase vancomycin to 1g IV q8h       Follow up renal function & cultures, clinical course  Thank you for the consult.  Currie Paris, PharmD, BCPS Pager: [phone] Pharmacy: [phone] 05/18/2014 9:29 PM

## 2014-05-18 NOTE — Progress Notes (Signed)
CRITICAL VALUE ALERT  Critical value received:  C. Diff positive   Date of notification:  05/17/14  Time of notification:  2020  Critical value read back:yes   Nurse who received alert: Kerryn Tennant RN  MD notified (1st page):  Yes   Time of first page:  2030  MD notified (2nd page):  Time of second page:  Responding MD:  Mauri Brooklyn, MD   Time MD responded:  2040

## 2014-05-18 NOTE — Progress Notes (Addendum)
CRITICAL VALUE ALERT  Critical value received: Platelets 6 and Potassium 2.7  Date of notification:  05/18/14  Time of notification:  0625  Critical value read back: yes   Nurse who received alert:  Hollister Wessler RN   MD notified (1st page):  Yes   Time of first page:  [phone]  MD notified (2nd page):  Time of second page:  Responding MD:  Lake Bells MD   Time MD responded:  [phone]

## 2014-05-18 NOTE — Plan of Care (Signed)
Problem: Phase I Progression Outcomes Goal: Pain controlled with appropriate interventions Outcome: Progressing Goal: Hemodynamically stable Outcome: Progressing     

## 2014-05-18 NOTE — Progress Notes (Signed)
[place] Progress Note Patient Name: Carla Haas DOB: July 10, 1985 MRN: [id number]   Date of Service  05/18/2014  HPI/Events of Note  Thrombocytopenia to 6K, not bleeding  eICU Interventions  Transfuse 1 U pRBC     Intervention Category Intermediate Interventions: Thrombocytopenia - evaluation and management  MCQUAID, DOUGLAS 05/18/2014, 6:36 AM

## 2014-05-18 NOTE — Progress Notes (Signed)
[place] Progress Note Patient Name: Carla Haas DOB: 08/05/1985 MRN: [id number]   Date of Service  05/18/2014  HPI/Events of Note    eICU Interventions  Hypokalemia, repleted      Intervention Category Minor Interventions: Electrolytes abnormality - evaluation and management  Terrika Zuver 05/18/2014, 6:47 AM

## 2014-05-18 NOTE — Progress Notes (Signed)
PULMONARY / CRITICAL CARE MEDICINE   Name: Carla Haas MRN: [id number] DOB: 04-09-1986    ADMISSION DATE:  05/16/2014  REFERRING MD :  EDP  CHIEF COMPLAINT:  Hypotension and weakness  INITIAL PRESENTATION:  28 yo female presented with fever, abdominal pain, headache, hypotension. 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  She is followed at [facility] by heme/onc.  STUDIES:  11/21 CT abd/pelvis >> RLL consolidation, patch LLL ASD, mod hiatal hernia, b/l non obstructive renal stones  SIGNIFICANT EVENTS: 11/20 PLT transfusion at [facility] 11/21 to ED with PNA and septic shock, transfuse 2 units PRBC for Hb 6.6 11/22 C diff positive  SUBJECTIVE: Denied cough this am but p[rior notes indicate green sputum C diff positive Feels weak, tired  VITAL SIGNS: Temp:  [98.8 F (37.1 C)-101.8 F (38.8 C)] 99.7 F (37.6 C) (11/23 1025) Pulse Rate:  [76-116] 105 (11/23 1025) Resp:  [13-36] 30 (11/23 1025) BP: (70-122)/(46-90) 89/54 mmHg (11/23 1025) SpO2:  [93 %-100 %] 100 % (11/23 1025) INTAKE / OUTPUT:  Intake/Output Summary (Last 24 hours) at 05/18/14 1031 Last data filed at 05/18/14 1019  Gross per 24 hour  Intake 3785.2 ml  Output   2050 ml  Net 1735.2 ml    PHYSICAL EXAMINATION: General: no distress Neuro: sleepy, wakes up easily, normal strength HEENT: OP clear,  Cardiovascular: regular, no murmur Lungs: faint basilar rales, no wheezes Abdomen: soft, non tender Musculoskeletal: no edema Skin: no rashes  LABS:  CBC  Recent Labs Lab 05/16/14 2110 05/17/14 0900 05/18/14 0515  WBC 0.4* 0.7* 0.3*  HGB 6.6* 9.7* 8.3*  HCT 18.5* 26.3* 22.8*  PLT 16* 12* 6*   Coag's  Recent Labs Lab 05/16/14 1956 05/18/14 0515  APTT 41*  --   INR 1.85* 2.08*   BMET  Recent Labs Lab 05/16/14 1956 05/17/14 0900 05/18/14 0515  NA 140 134* 138  K 3.9 3.5* 2.7*  CL 108 105  108  CO2 17* 17* 19  BUN 18 8 6   CREATININE 0.73 0.47* 0.41*  GLUCOSE 87 158* 129*   Electrolytes  Recent Labs Lab 05/16/14 1956 05/17/14 0900 05/18/14 0515  CALCIUM 7.5* 7.5* 7.6*   Sepsis Markers  Recent Labs Lab 05/16/14 1318 05/16/14 1956 05/16/14 2115  LATICACIDVEN 1.02 3.5* 3.1*   Liver Enzymes  Recent Labs Lab 05/16/14 1307 05/16/14 1956  AST 24 19  ALT 28 20  ALKPHOS 108 76  BILITOT 0.8 0.5  ALBUMIN 3.7 2.5*   Cardiac Enzymes  Recent Labs Lab 05/16/14 1956  TROPONINI 0.44*   Imaging Dg Chest Port 1 View  05/17/2014   CLINICAL DATA:  Weakness, fever, nausea, vomiting, diarrhea, pneumonia, history leukemia portable exam 0849 hr compared to 05/16/2014  EXAM: PORTABLE CHEST - 1 VIEW  COMPARISON:  None.  FINDINGS: RIGHT jugular dual-lumen power port with tip projecting over SVC.  Normal heart size, mediastinal contours, and pulmonary vascularity.  Persistent RIGHT lower lobe infiltrate consistent with pneumonia.  Remaining lungs clear.  No pleural effusion or pneumothorax.  IMPRESSION: Persistent RIGHT lower lobe pneumonia.   Electronically Signed   By: Lavonia Dana M.D.   On: 05/17/2014 09:59     ASSESSMENT / PLAN:  PULMONARY A:  Acute hypoxic respiratory failure  RLL HCAP P:   F/u CXR Oxygen to keep SpO2 > 92% Abx as below  CARDIOVASCULAR Rt chest port  A:  Septic shock.  P:  Pressors to keep MAP > 65, SBP > 90 Continue IV fluids hydrocort empirically ordered, note random cortisol 148 Consider CVC placement to manage volume administration if she remains on pressors.   RENAL A:   Mild non-anion gap acidosis. P:   F/u BMET, UOP  GASTROINTESTINAL A:   Abdominal pain. C diff colitis P:   Advance diet as tolerated Rx C diff as beliow  HEMATOLOGIC A:   Chemo induced pancytopenia Acute lymphoblastic leukemia, followed at [facility] P:  F/u CBC SCD's for DVT prophylaxis  INFECTIOUS A: Septic shock  Neutropenic fever RLL HCAP C  diff colitis P:   Vanco 11/21 >>  Cefepime 11/21 >>  Flagyl PO 11/22 >>  Vanco PO 11/23 >>  Fluconazole 11/23 (prophylaxis) >>  acyclovir 11/23 (prophylaxis) >>   Blood cx 11/21 >> Urine cx 11/21 >> negative Respiratory viral panel 11/22 >>  C diff 11/22 >> positive  Add Po vanco to Po flagyl given her neutropenia  ENDOCRINE A:  Relative adrenal insufficiency. P:   Will d/c Solu-cortef as random cortisol 148  NEUROLOGIC A:   Acute encephalopathy 2nd to septic shock, improved P:   Monitor  FAMILY  - Updates: updated patient alone 11/23  - Inter-disciplinary family meet or Palliative Care meeting due by 05/23/14:    TODAY'S SUMMARY:  Continue HCAP Abx, add PO vanco to Po flagyl on 11/23.  Wean pressors as tolerated.  Will need follow up with vs transfer to [facility] once stable.   CC time 35 minutes.  Baltazar Apo, MD, PhD 05/18/2014, 11:02 AM [place] [facility] [phone] or if no answer [phone]

## 2014-05-18 NOTE — Plan of Care (Signed)
Problem: Phase I Progression Outcomes Goal: Pain controlled with appropriate interventions Outcome: Completed/Met Date Met:  05/18/14     

## 2014-05-19 ENCOUNTER — Inpatient Hospital Stay (HOSPITAL_COMMUNITY): Payer: Medicare Other

## 2014-05-19 LAB — PREPARE PLATELET PHERESIS: UNIT DIVISION: 0

## 2014-05-19 LAB — BASIC METABOLIC PANEL
Anion gap: 11 (ref 5–15)
Anion gap: 11 (ref 5–15)
BUN: 6 mg/dL (ref 6–23)
BUN: 5 mg/dL — ABNORMAL LOW (ref 6–23)
CO2: 20 meq/L (ref 19–32)
CO2: 20 mEq/L (ref 19–32)
Calcium: 7.8 mg/dL — ABNORMAL LOW (ref 8.4–10.5)
Calcium: 7.8 mg/dL — ABNORMAL LOW (ref 8.4–10.5)
Chloride: 112 mEq/L (ref 96–112)
Chloride: 109 mEq/L (ref 96–112)
Creatinine, Ser: 0.44 mg/dL — ABNORMAL LOW (ref 0.50–1.10)
Creatinine, Ser: 0.53 mg/dL (ref 0.50–1.10)
GFR calc Af Amer: >90 mL/min (ref >90)
GLUCOSE: 96 mg/dL (ref 70–99)
GLUCOSE: 120 mg/dL — AB (ref 70–99)
POTASSIUM: 3 meq/L — AB (ref 3.7–5.3)
POTASSIUM: 2.9 meq/L — AB (ref 3.7–5.3)
Sodium: 143 mEq/L (ref 137–147)
Sodium: 140 mEq/L (ref 137–147)

## 2014-05-19 LAB — CBC WITH DIFFERENTIAL/PLATELET
BASOS PCT: 0 % (ref 0–1)
Basophils Absolute: 0 10*3/uL (ref 0.0–0.1)
EOS PCT: 0 % (ref 0–5)
Eosinophils Absolute: 0 10*3/uL (ref 0.0–0.7)
HEMATOCRIT: 21.2 % — AB (ref 36.0–46.0)
HEMOGLOBIN: 7.7 g/dL — AB (ref 12.0–15.0)
Lymphocytes Relative: 86 % — ABNORMAL HIGH (ref 12–46)
Lymphs Abs: 0.2 10*3/uL — ABNORMAL LOW (ref 0.7–4.0)
MCH: 28.9 pg (ref 26.0–34.0)
MCHC: 36.3 g/dL — ABNORMAL HIGH (ref 30.0–36.0)
MCV: 79.7 fL (ref 78.0–100.0)
MONOS PCT: 9 % (ref 3–12)
Monocytes Absolute: 0 10*3/uL — ABNORMAL LOW (ref 0.1–1.0)
Neutro Abs: 0 10*3/uL — ABNORMAL LOW (ref 1.7–7.7)
Neutrophils Relative %: 5 % — ABNORMAL LOW (ref 43–77)
PLATELETS: 32 10*3/uL — AB (ref 150–400)
RBC: 2.66 MIL/uL — AB (ref 3.87–5.11)
RDW: 13.8 % (ref 11.5–15.5)
WBC: 0.2 10*3/uL — AB (ref 4.0–10.5)

## 2014-05-19 LAB — CULTURE, RESPIRATORY W GRAM STAIN: Culture: NORMAL

## 2014-05-19 LAB — CULTURE, RESPIRATORY: GRAM STAIN: NONE SEEN

## 2014-05-19 MED ORDER — POTASSIUM CHLORIDE CRYS ER 20 MEQ PO TBCR
30.0000 meq | EXTENDED_RELEASE_TABLET | ORAL | Status: AC
  Administered 2014-05-19 (×2): 30 meq via ORAL
  Filled 2014-05-19 (×4): qty 1

## 2014-05-19 MED ORDER — ZOLPIDEM TARTRATE 5 MG PO TABS
5.0000 mg | ORAL_TABLET | Freq: Every day | ORAL | Status: DC
  Administered 2014-05-19: 5 mg via ORAL
  Filled 2014-05-19: qty 1

## 2014-05-19 NOTE — Progress Notes (Addendum)
PULMONARY / CRITICAL CARE MEDICINE   Name: Carla Haas MRN: [id number] DOB: Oct 03, 1985    ADMISSION DATE:  05/16/2014  REFERRING MD :  EDP  CHIEF COMPLAINT:  Hypotension and weakness  INITIAL PRESENTATION:  28 yo female presented with fever, abdominal pain, headache, hypotension. 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  She is followed at [facility] by heme/onc.  STUDIES:  11/21 CT abd/pelvis >> RLL consolidation, patch LLL ASD, mod hiatal hernia, b/l non obstructive renal stones  SIGNIFICANT EVENTS: 11/20 PLT transfusion at [facility] 11/21 to ED with PNA and septic shock, transfuse 2 units PRBC for Hb 6.6 11/22 C diff positive 11/24 +rhinovirus  SUBJECTIVE: A bit better, still feels weak  VITAL SIGNS: Temp:  [99.3 F (37.4 C)-103.6 F (39.8 C)] 103.1 F (39.5 C) (11/24 0830) Pulse Rate:  [96-144] 119 (11/24 0830) Resp:  [0-43] 39 (11/24 0830) BP: (83-120)/(49-86) 96/62 mmHg (11/24 0830) SpO2:  [95 %-100 %] 100 % (11/24 0830) Weight:  [85 lb 1.6 oz (38.6 kg)] 85 lb 1.6 oz (38.6 kg) (11/24 0400) INTAKE / OUTPUT:  Intake/Output Summary (Last 24 hours) at 05/19/14 0910 Last data filed at 05/19/14 0800  Gross per 24 hour  Intake 3686.17 ml  Output    965 ml  Net 2721.17 ml    PHYSICAL EXAMINATION: General: no distress at rest. Reports cough with clear sputum Neuro:Awake and follows commands HEENT: OP clear,  Cardiovascular: regular, no murmur Lungs:no wheezes Abdomen: soft, non tender Musculoskeletal: no edema Skin: no rashes  LABS:  CBC  Recent Labs Lab 05/17/14 0900 05/18/14 0515 05/19/14 0313  WBC 0.7* 0.3* 0.2*  HGB 9.7* 8.3* 7.7*  HCT 26.3* 22.8* 21.2*  PLT 12* 6* 32*   Coag's  Recent Labs Lab 05/16/14 1956 05/18/14 0515  APTT 41*  --   INR 1.85* 2.08*   BMET  Recent Labs Lab 05/18/14 0515 05/19/14 0313 05/19/14 0500  NA 138 143 140   K 2.7* 3.0* 2.9*  CL 108 112 109  CO2 19 20 20   BUN 6 6 5*  CREATININE 0.41* 0.44* 0.53  GLUCOSE 129* 96 120*   Electrolytes  Recent Labs Lab 05/18/14 0515 05/19/14 0313 05/19/14 0500  CALCIUM 7.6* 7.8* 7.8*   Sepsis Markers  Recent Labs Lab 05/16/14 1318 05/16/14 1956 05/16/14 2115  LATICACIDVEN 1.02 3.5* 3.1*   Liver Enzymes  Recent Labs Lab 05/16/14 1307 05/16/14 1956  AST 24 19  ALT 28 20  ALKPHOS 108 76  BILITOT 0.8 0.5  ALBUMIN 3.7 2.5*   Cardiac Enzymes  Recent Labs Lab 05/16/14 1956  TROPONINI 0.44*   Imaging No results found.   ASSESSMENT / PLAN:  PULMONARY A:  Acute hypoxic respiratory failure  RLL HCAP +rhinovirus P:   F/u CXR on 11/25 Oxygen to keep SpO2 > 92% Abx as below  CARDIOVASCULAR Rt chest port  A:  Septic shock.  P:  Pressors to keep SBP > 90 Continue IV fluids hydrocort empirically ordered, note random cortisol 148 Defer CVC placement, reconsider to manage volume administration if she remains on pressors.   RENAL A:   Mild non-anion gap acidosis. Hypokalemia P:   F/u BMET, UOP Replace K+  GASTROINTESTINAL A:   Abdominal pain. C diff colitis P:   Advance diet as tolerated Rx C diff  HEMATOLOGIC A:   Chemo induced pancytopenia Acute lymphoblastic leukemia, followed at [facility] P:  F/u CBC SCD's  for DVT prophylaxis  INFECTIOUS A: Septic shock  Neutropenic fever RLL HCAP C diff colitis P:   Vanco 11/21 >>  Cefepime 11/21 >>  Flagyl PO 11/22 >>  Vanco PO 11/23 >>  Fluconazole 11/23 (prophylaxis) >>  acyclovir 11/23 (prophylaxis) >>   Blood cx 11/21 >> Urine cx 11/21 >> negative Respiratory viral panel 11/22 >> ++rhinovirus C diff 11/22 >> positive  Day 4 empiric abx for neutropenic fever / sepsis. Will narrow when blood cx's final   ENDOCRINE A:  Relative adrenal insufficiency. P:   Stopped  Solu-cortef as random cortisol 148  NEUROLOGIC A:   Acute encephalopathy 2nd to septic  shock, resolved 11/24 P:   Monitor  FAMILY  - Updates: updated patient alone 11/24  - Inter-disciplinary family meet or Palliative Care meeting due by 05/23/14:    TODAY'S SUMMARY:  Continue HCAP Abx, PO vanco to Po flagyl.  Wean pressors as tolerated.  Will need follow up with vs transfer to [facility] once stable. 11/24 fever 103 despite abx. Clear sputum from cough. + rhinovirus.  [name] Minor ACNP Maryanna Shape PCCM Pager [phone] till 3 pm If no answer page [phone] 05/19/2014, 9:11 AM   Attending Note:  I have examined patient, reviewed labs, studies, notes. I have discussed the case with S Minor and agree with the data and plans as amended above. Her pressor needs are improving. Will continue broad spectrum abx pending all cx data. Note rhinovirus positive. Continue supportive care. My independent CC time 35 minutes  Baltazar Apo, MD, PhD 05/19/2014, 11:07 AM [place] [facility] [phone] or if no answer [phone]

## 2014-05-19 NOTE — Progress Notes (Signed)
RN called  1. Temp still 103F On max anti microbial Rx Patient istting in chair REc  Rx symptoms  2. Insomnia Ambien tonight  Dr. Brand Males, M.D., [facility].C.P Pulmonary and Critical Care Medicine Staff Physician [place] [facility] Pager: [phone], If no answer or between  15:00h - 7:00h: call [phone]  05/19/2014 4:40 PM

## 2014-05-19 NOTE — Progress Notes (Signed)
[facility] ADULT ICU REPLACEMENT PROTOCOL FOR AM LAB REPLACEMENT ONLY  The patient does apply for the [facility] Adult ICU Electrolyte Replacment Protocol based on the criteria listed below:   1. Is GFR >/= 40 ml/min? Yes.    Patient's GFR today is >90 2. Is urine output >/= 0.5 ml/kg/hr for the last 6 hours? Yes.   Patient's UOP is 1.29 ml/kg/hr 3. Is BUN < 60 mg/dL? Yes.    Patient's BUN today is 6 4. Abnormal electrolyte K 3.0 5. Ordered repletion with: per protocol 6. If a panic level lab has been reported, has the CCM MD in charge been notified? Yes.  .   Physician:  E Deterding  Christeen Douglas 05/19/2014 4:26 AM

## 2014-05-20 ENCOUNTER — Inpatient Hospital Stay (HOSPITAL_COMMUNITY): Payer: Medicare Other

## 2014-05-20 LAB — CBC WITH DIFFERENTIAL/PLATELET
BASOS ABS: 0 10*3/uL (ref 0.0–0.1)
Basophils Relative: 0 % (ref 0–1)
EOS ABS: 0 10*3/uL (ref 0.0–0.7)
Eosinophils Relative: 0 % (ref 0–5)
HCT: 20.6 % — ABNORMAL LOW (ref 36.0–46.0)
Hemoglobin: 7.5 g/dL — ABNORMAL LOW (ref 12.0–15.0)
LYMPHS PCT: 39 % (ref 12–46)
Lymphs Abs: 0.2 10*3/uL — ABNORMAL LOW (ref 0.7–4.0)
MCH: 28.7 pg (ref 26.0–34.0)
MCHC: 36.4 g/dL — AB (ref 30.0–36.0)
MCV: 78.9 fL (ref 78.0–100.0)
MONOS PCT: 5 % (ref 3–12)
Monocytes Absolute: 0 10*3/uL — ABNORMAL LOW (ref 0.1–1.0)
NEUTROS ABS: 0.4 10*3/uL — AB (ref 1.7–7.7)
Neutrophils Relative %: 56 % (ref 43–77)
PLATELETS: 14 10*3/uL — AB (ref 150–400)
RBC: 2.61 MIL/uL — ABNORMAL LOW (ref 3.87–5.11)
RDW: 13.8 % (ref 11.5–15.5)
WBC: 0.6 10*3/uL — CL (ref 4.0–10.5)

## 2014-05-20 LAB — HEPATIC FUNCTION PANEL
ALT: 14 U/L (ref 0–35)
AST: 13 U/L (ref 0–37)
Albumin: 1.9 g/dL — ABNORMAL LOW (ref 3.5–5.2)
Alkaline Phosphatase: 45 U/L (ref 39–117)
BILIRUBIN DIRECT: 0.3 mg/dL (ref 0.0–0.3)
BILIRUBIN TOTAL: 0.8 mg/dL (ref 0.3–1.2)
Indirect Bilirubin: 0.5 mg/dL (ref 0.3–0.9)
Total Protein: 4.8 g/dL — ABNORMAL LOW (ref 6.0–8.3)

## 2014-05-20 LAB — BASIC METABOLIC PANEL
ANION GAP: 13 (ref 5–15)
Anion gap: 12 (ref 5–15)
BUN: 3 mg/dL — ABNORMAL LOW (ref 6–23)
BUN: 3 mg/dL — AB (ref 6–23)
CHLORIDE: 106 meq/L (ref 96–112)
CO2: 20 mEq/L (ref 19–32)
CO2: 19 mEq/L (ref 19–32)
Calcium: 7.6 mg/dL — ABNORMAL LOW (ref 8.4–10.5)
Calcium: 7.5 mg/dL — ABNORMAL LOW (ref 8.4–10.5)
Chloride: 104 mEq/L (ref 96–112)
Creatinine, Ser: 0.47 mg/dL — ABNORMAL LOW (ref 0.50–1.10)
Creatinine, Ser: 0.48 mg/dL — ABNORMAL LOW (ref 0.50–1.10)
GFR calc Af Amer: >90 mL/min (ref >90)
GFR calc non Af Amer: >90 mL/min (ref >90)
Glucose, Bld: 106 mg/dL — ABNORMAL HIGH (ref 70–99)
Glucose, Bld: 88 mg/dL (ref 70–99)
Potassium: 2.9 mEq/L — CL (ref 3.7–5.3)
Potassium: 3 mEq/L — ABNORMAL LOW (ref 3.7–5.3)
SODIUM: 136 meq/L — AB (ref 137–147)
Sodium: 138 mEq/L (ref 137–147)

## 2014-05-20 LAB — CBC
HCT: 21.9 % — ABNORMAL LOW (ref 36.0–46.0)
Hemoglobin: 8.1 g/dL — ABNORMAL LOW (ref 12.0–15.0)
MCH: 29.2 pg (ref 26.0–34.0)
MCHC: 37 g/dL — ABNORMAL HIGH (ref 30.0–36.0)
MCV: 79.1 fL (ref 78.0–100.0)
PLATELETS: 21 10*3/uL — AB (ref 150–400)
RBC: 2.77 MIL/uL — AB (ref 3.87–5.11)
RDW: 13.8 % (ref 11.5–15.5)
WBC: 0.2 10*3/uL — CL (ref 4.0–10.5)

## 2014-05-20 LAB — LACTIC ACID, PLASMA: LACTIC ACID, VENOUS: 1.2 mmol/L (ref 0.5–2.2)

## 2014-05-20 LAB — PROCALCITONIN: Procalcitonin: 14.08 ng/mL

## 2014-05-20 LAB — TROPONIN I: TROPONIN I: 1.05 ng/mL — AB (ref <0.30)

## 2014-05-20 LAB — VANCOMYCIN, TROUGH: Vancomycin Tr: 12.4 ug/mL (ref 10.0–20.0)

## 2014-05-20 MED ORDER — POTASSIUM CHLORIDE CRYS ER 20 MEQ PO TBCR
40.0000 meq | EXTENDED_RELEASE_TABLET | Freq: Once | ORAL | Status: AC
  Administered 2014-05-20: 40 meq via ORAL
  Filled 2014-05-20: qty 2

## 2014-05-20 MED ORDER — SODIUM CHLORIDE 0.9 % IV BOLUS (SEPSIS)
1000.0000 mL | Freq: Once | INTRAVENOUS | Status: AC
  Administered 2014-05-20: 1000 mL via INTRAVENOUS

## 2014-05-20 MED ORDER — SODIUM CHLORIDE 0.9 % IV SOLN
400.0000 mg | Freq: Once | INTRAVENOUS | Status: AC
  Administered 2014-05-20: 400 mg via INTRAVENOUS
  Filled 2014-05-20: qty 4

## 2014-05-20 MED ORDER — POTASSIUM CHLORIDE CRYS ER 20 MEQ PO TBCR
20.0000 meq | EXTENDED_RELEASE_TABLET | Freq: Once | ORAL | Status: AC
  Administered 2014-05-20: 20 meq via ORAL
  Filled 2014-05-20: qty 1

## 2014-05-20 MED ORDER — SODIUM CHLORIDE 0.9 % IV BOLUS (SEPSIS)
500.0000 mL | Freq: Once | INTRAVENOUS | Status: AC
Start: 2014-05-20 — End: 2014-05-20
  Administered 2014-05-20: 500 mL via INTRAVENOUS

## 2014-05-20 MED ORDER — SODIUM CHLORIDE 0.9 % IV SOLN: Freq: Once | INTRAVENOUS | Status: DC

## 2014-05-20 NOTE — Progress Notes (Signed)
PULMONARY / CRITICAL CARE MEDICINE   Name: Carla Haas MRN: [id number] DOB: 1985-09-18    ADMISSION DATE:  05/16/2014  REFERRING MD :  EDP  CHIEF COMPLAINT:  Hypotension and weakness  INITIAL PRESENTATION:  28 yo female presented with fever, abdominal pain, headache, hypotension. 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  She is followed at [facility] by heme/onc.  STUDIES:  11/21 CT abd/pelvis >> RLL consolidation, patch LLL ASD, mod hiatal hernia, b/l non obstructive renal stones  SIGNIFICANT EVENTS: 11/20 PLT transfusion at [facility] 11/21 to ED with PNA and septic shock, transfuse 2 units PRBC for Hb 6.6 11/22 C diff positive 11/24 +rhinovirus  SUBJECTIVE: Pressors restarted 11/24 pm, weaning to off this am Got OOB to chair  VITAL SIGNS: Temp:  [101.1 F (38.4 C)-103.8 F (39.9 C)] 102 F (38.9 C) (11/25 0930) Pulse Rate:  [104-133] 119 (11/25 0930) Resp:  [0-45] 27 (11/25 0930) BP: (82-109)/(32-69) 95/66 mmHg (11/25 0930) SpO2:  [94 %-100 %] 100 % (11/25 0930) INTAKE / OUTPUT:  Intake/Output Summary (Last 24 hours) at 05/20/14 1010 Last data filed at 05/20/14 0900  Gross per 24 hour  Intake 3201.36 ml  Output   2425 ml  Net 776.36 ml    PHYSICAL EXAMINATION: General: no distress at rest. Reports burning with urination  Neuro:Awake and follows commands, drowsy(ambein during the night) HEENT: OP clear,  Cardiovascular: regular, no murmur Lungs:no wheezes Abdomen: soft, non tender Musculoskeletal: no edema Skin: no rashes  LABS:  CBC  Recent Labs Lab 05/18/14 0515 05/19/14 0313 05/20/14 0540  WBC 0.3* 0.2* 0.2*  HGB 8.3* 7.7* 8.1*  HCT 22.8* 21.2* 21.9*  PLT 6* 32* 21*   Coag's  Recent Labs Lab 05/16/14 1956 05/18/14 0515  APTT 41*  --   INR 1.85* 2.08*   BMET  Recent Labs Lab 05/19/14 0313 05/19/14 0500 05/20/14 0540  NA 143 140 136*   K 3.0* 2.9* 2.9*  CL 112 109 104  CO2 20 20 20   BUN 6 5* 3*  CREATININE 0.44* 0.53 0.47*  GLUCOSE 96 120* 106*   Electrolytes  Recent Labs Lab 05/19/14 0313 05/19/14 0500 05/20/14 0540  CALCIUM 7.8* 7.8* 7.6*   Sepsis Markers  Recent Labs Lab 05/16/14 1318 05/16/14 1956 05/16/14 2115  LATICACIDVEN 1.02 3.5* 3.1*   Liver Enzymes  Recent Labs Lab 05/16/14 1307 05/16/14 1956  AST 24 19  ALT 28 20  ALKPHOS 108 76  BILITOT 0.8 0.5  ALBUMIN 3.7 2.5*   Cardiac Enzymes  Recent Labs Lab 05/16/14 1956  TROPONINI 0.44*   Imaging Dg Chest Port 1 View  05/19/2014   CLINICAL DATA:  History of leukemia and respiratory failure. Weakness.  EXAM: PORTABLE CHEST - 1 VIEW  COMPARISON:  05/17/2014  FINDINGS: Right Port-A-Cath remains in place, unchanged. Mild cardiomegaly. Consolidation in the right lower lobe has increased since prior study. Suspect small right pleural effusion. No confluent opacity on the left. No effusions.  IMPRESSION: Worsening right lower lobe consolidation and small right effusion.   Electronically Signed   By: Rolm Baptise M.D.   On: 05/19/2014 10:16     ASSESSMENT / PLAN:  PULMONARY A:  Acute hypoxic respiratory failure  RLL HCAP +rhinovirus P:   F/u CXR on 11/25 Oxygen to keep SpO2 > 92% Abx as below  CARDIOVASCULAR Rt chest port  A:  Septic shock.  P:  Pressors to keep  SBP > 90 Continue IV fluids hydrocort empirically ordered, note random cortisol 148 Defer CVC placement  RENAL A:   Mild non-anion gap acidosis. Hypokalemia P:   F/u BMET, UOP Replace K+  GASTROINTESTINAL A:   Abdominal pain. C diff colitis P:   Advance diet as tolerated Rx C diff  HEMATOLOGIC A:   Chemo induced pancytopenia Acute lymphoblastic leukemia, followed at [facility] P:  F/u CBC SCD's for DVT prophylaxis  INFECTIOUS A: Septic shock  Neutropenic fever RLL HCAP C diff colitis P:   Vanco 11/21 >> 11/25 Cefepime 11/21 >>  Flagyl PO 11/22  >>  Vanco PO 11/23 >>  Fluconazole 11/23 (prophylaxis) >>  acyclovir 11/23 (prophylaxis) >>   Blood cx 11/21 >>NGTD>> Urine cx 11/21 >> negative Respiratory viral panel 11/22 >> ++rhinovirus C diff 11/22 >> positive 11/22 sputum>>nl flora 11/25 uc>> Day 5 empiric abx for neutropenic fever / sepsis.  Will dc IV vanco 11/25 to see if persistent fever may be drug induced.  ENDOCRINE A:  Relative adrenal insufficiency. P:   Stopped  Solu-cortef as random cortisol 148  NEUROLOGIC A:   Acute encephalopathy 2nd to septic shock, resolved 11/24 P:   Monitor  FAMILY  - Updates: updated patient alone 11/24  - Inter-disciplinary family meet or Palliative Care meeting due by 05/23/14:    TODAY'S SUMMARY:  Continue HCAP Abx, PO vanco to Po flagyl.  Wean pressors as tolerated.  Required pressors briefly after receiving ambrein via e link MD. Still with fevers 103 despite abx coverage. ? If temp from abx?  [name] Minor ACNP Maryanna Shape PCCM Pager [phone] till 3 pm If no answer page [phone] 05/20/2014, 10:10 AM   Attending Note I have examined patient, reviewed labs, studies, notes. I have discussed case with S Minor and agree with the plans as amended above. She is off pressors this am, has continued to have fever. Will trial d/c IV vanco to see if fever impacted. Suspect out of ICU 11/26.   Baltazar Apo, MD, PhD 05/20/2014, 1:17 PM  Pulmonary and Critical Care [phone] or if no answer [phone]

## 2014-05-20 NOTE — Progress Notes (Addendum)
   Filed Vitals:   05/20/14 1600 05/20/14 1700 05/20/14 1800 05/20/14 1900  BP: 101/68 90/66 94/68  85/72  Pulse:  125 126 125  Temp: 102.6 F (39.2 C) 103.6 F (39.8 C) 103.8 F (39.9 C) 103.6 F (39.8 C)  TempSrc:      Resp: 35 25 33 24  Height:      Weight:      SpO2:  99% 100% 100%    Still febrile DRopping BP - back on levophed. CVP not knowable due to Cisco tachypneic  Plan Check stat cbc,bmet, lft, lactate, pct, trop Fluid bolus 1 L and reassess IV motrin x 1 for fever  Dr. Brand Males, M.D., [facility].C.P Pulmonary and Critical Care Medicine Staff Physician [place] [facility] Pager: [phone], If no answer or between  15:00h - 7:00h: call [phone]  05/20/2014 7:55 PM

## 2014-05-20 NOTE — Care Management Note (Signed)
    Page 1 of 2   05/20/2014     12:38:47 PM CARE MANAGEMENT NOTE 05/20/2014  Patient:  LAMOINE, FREDRICKSEN   Account Number:  [id number]  Date Initiated:  05/20/2014  Documentation initiated by:  Mathea Frieling  Subjective/Objective Assessment:   28 yo female presented with fever, abdominal pain, headache, hypotension. 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  She is followed at [facility] by he     Action/Plan:   tnd   Anticipated DC Date:  05/23/2014   Anticipated DC Plan:  [place]  NA      Choice offered to / List presented to:  NA      DME agency  NA     [place] arranged  NA      [place] agency  NA   Status of service:  In process, will continue to follow Medicare Important Message given?   (If response is "NO", the following Medicare IM given date fields will be blank) Date Medicare IM given:   Medicare IM given by:   Date Additional Medicare IM given:   Additional Medicare IM given by:    Discharge Disposition:    Per UR Regulation:  Reviewed for med. necessity/level of care/duration of stay  If discussed at [place] of Stay Meetings, dates discussed:    Comments:  [id number]/Christapher Gillian Rosana Hoes, RN, BSN, CCM Chart reviewed. Discharge needs and patient's stay to be reviewed and followed by case manager. Chart note for progression of stay: Continue HCAP Abx, PO vanco to Po flagyl.  Wean pressors as tolerated.  Required pressors briefly after receiving ambrein via e link MD. Still with fevers 103 despite abx coverage. ? If temp from abx?

## 2014-05-20 NOTE — Progress Notes (Signed)
Lab review  PULMONARY No results for input(s): PHART, PCO2ART, PO2ART, HCO3, TCO2, O2SAT in the last 168 hours.  Invalid input(s): PCO2, PO2  CBC  Recent Labs Lab 05/19/14 0313 05/20/14 0540 05/20/14 2050  HGB 7.7* 8.1* 7.5*  HCT 21.2* 21.9* 20.6*  WBC 0.2* 0.2* 0.6*  PLT 32* 21* 14*    COAGULATION  Recent Labs Lab 05/16/14 1956 05/18/14 0515  INR 1.85* 2.08*    CARDIAC   Recent Labs Lab 05/16/14 1956 05/20/14 2050  TROPONINI 0.44* 1.05*   No results for input(s): PROBNP in the last 168 hours.   CHEMISTRY  Recent Labs Lab 05/18/14 0515 05/19/14 0313 05/19/14 0500 05/20/14 0540 05/20/14 2050  NA 138 143 140 136* 138  K 2.7* 3.0* 2.9* 2.9* 3.0*  CL 108 112 109 104 106  CO2 19 20 20 20 19   GLUCOSE 129* 96 120* 106* 88  BUN 6 6 5* 3* 3*  CREATININE 0.41* 0.44* 0.53 0.47* 0.48*  CALCIUM 7.6* 7.8* 7.8* 7.6* 7.5*   Estimated Creatinine Clearance: 63.8 mL/min (by C-G formula based on Cr of 0.48).   LIVER  Recent Labs Lab 05/16/14 1307 05/16/14 1956 05/18/14 0515 05/20/14 2050  AST 24 19  --  13  ALT 28 20  --  14  ALKPHOS 108 76  --  45  BILITOT 0.8 0.5  --  0.8  PROT 7.2 5.1*  --  4.8*  ALBUMIN 3.7 2.5*  --  1.9*  INR  --  1.85* 2.08*  --      INFECTIOUS  Recent Labs Lab 05/16/14 1956 05/16/14 2115 05/20/14 2030  LATICACIDVEN 3.5* 3.1* 1.2  PROCALCITON  --   --  14.08     ENDOCRINE CBG (last 3)  No results for input(s): GLUCAP in the last 72 hours.     A)  1 NSTEMI - alreadly auto-anticoagulated 2. Low platelets, High INR but no active bleeding 3. Low WBC 4. Anemia - but now < 8gm% in setting of NSTEMI, c/w critical illness 5. PCT c/w sepsis - but lactate normalized. FEver normaized with motrin   PLAN 1 unit prbc in view of NSTEMI - even though is demand Monitor platelet closely Might need ID consult in AM  Dr. Brand Males, M.D., [facility].C.P Pulmonary and Critical Care Medicine Staff Physician [place] [facility] Pager: [phone], If no answer or between  15:00h - 7:00h: call [phone]  05/20/2014 10:43 PM

## 2014-05-20 NOTE — Progress Notes (Signed)
CRITICAL VALUE ALERT  Critical value received: trp 1.05  Date of notification:  05/20/14  Time of notification:  2150  Critical value read back:Yes.    Nurse who received alert:  c Scout Gumbs  MD notified (1st page):  elink Time of first page: 2220  MD notified (2nd page):  Time of second page:  Responding MD:  elink   Time MD responded:  2220

## 2014-05-20 NOTE — Progress Notes (Signed)
CRITICAL VALUE ALERT  Critical value received:  Platelets 21  Date of notification:  05/20/14  Time of notification:  0615  Critical value read back:Yes.    Nurse who received alert:  Avon Gully RN  MD notified (1st page):  Dr. Jimmy Footman  Time of first page:  [phone]  Responding MD: Dr. Jimmy Footman  Time MD responded:  [phone]

## 2014-05-21 ENCOUNTER — Inpatient Hospital Stay (HOSPITAL_COMMUNITY): Payer: Medicare Other

## 2014-05-21 ENCOUNTER — Encounter (HOSPITAL_COMMUNITY): Payer: Self-pay | Admitting: Internal Medicine

## 2014-05-21 DIAGNOSIS — I369 Nonrheumatic tricuspid valve disorder, unspecified: Secondary | ICD-10-CM

## 2014-05-21 DIAGNOSIS — T451X5A Adverse effect of antineoplastic and immunosuppressive drugs, initial encounter: Secondary | ICD-10-CM

## 2014-05-21 DIAGNOSIS — J189 Pneumonia, unspecified organism: Secondary | ICD-10-CM | POA: Diagnosis present

## 2014-05-21 DIAGNOSIS — J918 Pleural effusion in other conditions classified elsewhere: Secondary | ICD-10-CM

## 2014-05-21 DIAGNOSIS — G620 Drug-induced polyneuropathy: Secondary | ICD-10-CM | POA: Insufficient documentation

## 2014-05-21 DIAGNOSIS — A047 Enterocolitis due to Clostridium difficile: Secondary | ICD-10-CM

## 2014-05-21 DIAGNOSIS — N200 Calculus of kidney: Secondary | ICD-10-CM

## 2014-05-21 DIAGNOSIS — A0472 Enterocolitis due to Clostridium difficile, not specified as recurrent: Secondary | ICD-10-CM | POA: Diagnosis present

## 2014-05-21 HISTORY — DX: Calculus of kidney: N20.0

## 2014-05-21 LAB — CBC WITH DIFFERENTIAL/PLATELET
BASOS PCT: 0 % (ref 0–1)
Basophils Absolute: 0 10*3/uL (ref 0.0–0.1)
EOS PCT: 0 % (ref 0–5)
Eosinophils Absolute: 0 10*3/uL (ref 0.0–0.7)
HCT: 25 % — ABNORMAL LOW (ref 36.0–46.0)
Hemoglobin: 9 g/dL — ABNORMAL LOW (ref 12.0–15.0)
Lymphocytes Relative: 26 % (ref 12–46)
Lymphs Abs: 0.2 10*3/uL — ABNORMAL LOW (ref 0.7–4.0)
MCH: 28.9 pg (ref 26.0–34.0)
MCHC: 36 g/dL (ref 30.0–36.0)
MCV: 80.4 fL (ref 78.0–100.0)
Monocytes Absolute: 0.2 10*3/uL (ref 0.1–1.0)
Monocytes Relative: 29 % — ABNORMAL HIGH (ref 3–12)
Neutro Abs: 0.4 10*3/uL — ABNORMAL LOW (ref 1.7–7.7)
Neutrophils Relative %: 45 % (ref 43–77)
Platelets: 15 10*3/uL — CL (ref 150–400)
RBC: 3.11 MIL/uL — AB (ref 3.87–5.11)
RDW: 13.9 % (ref 11.5–15.5)
WBC: 0.8 10*3/uL — CL (ref 4.0–10.5)

## 2014-05-21 LAB — URINE CULTURE
CULTURE: NO GROWTH
Colony Count: NO GROWTH

## 2014-05-21 LAB — BASIC METABOLIC PANEL
ANION GAP: 10 (ref 5–15)
BUN: 5 mg/dL — ABNORMAL LOW (ref 6–23)
CHLORIDE: 106 meq/L (ref 96–112)
CO2: 19 mEq/L (ref 19–32)
Calcium: 7.4 mg/dL — ABNORMAL LOW (ref 8.4–10.5)
Creatinine, Ser: 0.53 mg/dL (ref 0.50–1.10)
Glucose, Bld: 90 mg/dL (ref 70–99)
Potassium: 3 mEq/L — ABNORMAL LOW (ref 3.7–5.3)
SODIUM: 135 meq/L — AB (ref 137–147)

## 2014-05-21 LAB — PRO B NATRIURETIC PEPTIDE: PRO B NATRI PEPTIDE: 14679 pg/mL — AB (ref 0–125)

## 2014-05-21 LAB — PHOSPHORUS: Phosphorus: 0.5 mg/dL — CL (ref 2.3–4.6)

## 2014-05-21 LAB — LACTIC ACID, PLASMA: Lactic Acid, Venous: 1.3 mmol/L (ref 0.5–2.2)

## 2014-05-21 LAB — CALCIUM, IONIZED: Calcium, Ion: 1.06 mmol/L — ABNORMAL LOW (ref 1.12–1.23)

## 2014-05-21 LAB — TROPONIN I: TROPONIN I: 0.65 ng/mL — AB (ref <0.30)

## 2014-05-21 LAB — PROCALCITONIN: Procalcitonin: 10.88 ng/mL

## 2014-05-21 LAB — MAGNESIUM: Magnesium: 1.5 mg/dL (ref 1.5–2.5)

## 2014-05-21 LAB — PREPARE RBC (CROSSMATCH)

## 2014-05-21 MED ORDER — MAGNESIUM SULFATE 4 GM/100ML IV SOLN
4.0000 g | Freq: Once | INTRAVENOUS | Status: AC
  Administered 2014-05-21: 4 g via INTRAVENOUS
  Filled 2014-05-21: qty 100

## 2014-05-21 MED ORDER — HYDROCODONE-ACETAMINOPHEN 10-325 MG PO TABS
1.0000 | ORAL_TABLET | Freq: Four times a day (QID) | ORAL | Status: DC | PRN
  Administered 2014-05-22 (×2): 1 via ORAL
  Filled 2014-05-21 (×2): qty 1

## 2014-05-21 MED ORDER — POTASSIUM PHOSPHATES 15 MMOLE/5ML IV SOLN
30.0000 mmol | Freq: Once | INTRAVENOUS | Status: AC
  Administered 2014-05-21: 30 mmol via INTRAVENOUS
  Filled 2014-05-21: qty 10

## 2014-05-21 MED ORDER — VANCOMYCIN 50 MG/ML ORAL SOLUTION
500.0000 mg | Freq: Four times a day (QID) | ORAL | Status: DC
  Administered 2014-05-21 – 2014-05-22 (×4): 500 mg via ORAL
  Filled 2014-05-21 (×7): qty 10

## 2014-05-21 MED ORDER — SODIUM CHLORIDE 0.9 % IV BOLUS (SEPSIS)
500.0000 mL | Freq: Once | INTRAVENOUS | Status: AC
  Administered 2014-05-21: 500 mL via INTRAVENOUS

## 2014-05-21 MED ORDER — CETYLPYRIDINIUM CHLORIDE 0.05 % MT LIQD: 7.0000 mL | Freq: Two times a day (BID) | OROMUCOSAL | Status: DC

## 2014-05-21 MED ORDER — METRONIDAZOLE IN NACL 5-0.79 MG/ML-% IV SOLN
500.0000 mg | Freq: Three times a day (TID) | INTRAVENOUS | Status: DC
  Administered 2014-05-21 – 2014-05-22 (×3): 500 mg via INTRAVENOUS
  Filled 2014-05-21 (×3): qty 100

## 2014-05-21 NOTE — Progress Notes (Signed)
CRITICAL VALUE ALERT  Critical value received:  Phos 0.5  Date of notification:  05/21/14  Time of notification:  08:43  Critical value read back:Yes.    Nurse who received alert:  Juel Burrow, RN  MD notified (1st page):  Critical care pager  Time of first page:  09:00  MD notified (2nd page):  Time of second page:  Responding MD:  Dr. Chase Caller  Time MD responded:  10:40

## 2014-05-21 NOTE — Progress Notes (Signed)
Echocardiogram 2D Echocardiogram has been performed.  Carla Haas 05/21/2014, 3:25 PM

## 2014-05-21 NOTE — Plan of Care (Signed)
Problem: Consults Goal: General Medical Patient Education See Patient Education Module for specific education.  Outcome: Completed/Met Date Met:  05/21/14 Goal: Skin Care Protocol Initiated - if Braden Score 18 or less If consults are not indicated, leave blank or document N/A  Outcome: Completed/Met Date Met:  05/21/14     

## 2014-05-21 NOTE — Consult Note (Signed)
[facility]    Date of Admission:  05/16/2014   Total days of antibiotics 14        Day 6 cefepime        Day 5 oral metronidazole        Day 4 oral vancomycin       Reason for Consult: Septic shock due to HCAP and C. Difficile colitis in the setting of febrile neutropenia    Referring Physician: Dr. Brand Males  Principal Problem:   HCAP (healthcare-associated pneumonia) Active Problems:   Parapneumonic effusion   C. 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 sodium chloride   Intravenous Once  . sodium chloride   Intravenous Once  . acyclovir  400 mg Oral Daily  . [START ON 05/22/2014] antiseptic oral rinse  7 mL Mouth Rinse BID  . ceFEPime (MAXIPIME) IV  2 g Intravenous 3 times per day  . fluconazole  200 mg Oral Daily  . magnesium sulfate 1 - 4 g bolus IVPB  4 g Intravenous Once  . metroNIDAZOLE  500 mg Oral 3 times per day  . ondansetron (ZOFRAN) IV  4 mg Intravenous Once  . potassium phosphate IVPB (mmol)  30 mmol Intravenous Once  . vancomycin  125 mg Oral 4 times per day    Recommendations: 1. Continue cefepime for now 2. Increase oral vancomycin to 500 mg every 6 hours 3. Change oral metronidazole to IV 4. Agree with addition of granulocyte colony-stimulating factor 5. Agree with repeat chest CT scan and echocardiogram   Assessment: His medications have septic shock do to the combination of healthcare associated pneumonia and C. difficile colitis. I will intensify her C. difficile therapy and continue cefepime for her pneumonia and parapneumonic effusion are pending repeat CT scan. She may have a parapneumonic empyema. I will followup with you in the morning.   HPI: Carla Haas is a 28 y.o. female who was diagnosed with acute  lymphoblastic lymphoma and leukemia in 2012. She started induction chemotherapy in June 2013 and continued it until she was lost to followup December of 2013. She went back into care at [facility] in June of 2014 without any evidence of recurrent cancer at that time. She restarted chemotherapy in August of 2014 but was again lost to followup until she returned in October of this year. She was started back on reinduction chemotherapy at that time. She has had recent pancytopenia. She was started on empiric levofloxacin on November 12. She was positive for rhinovirus on a respiratory panel and all cultures at [facility] were negative.she's not sure why she was started on antibiotics. She was taking levofloxacin up until the time of admission.  On November 21 she became progressively weak and developed high fever and hypotension leading to admission. She recently developed some cough productive of yellow sputum and diarrhea. Admission blood cultures, urine cultures and sputum culture are all negative/nondiagnostic. Her admission chest x-ray revealed a rounded density in the right lung base. CT scan confirmed a pleural based nodular infiltrate in the right posterior lung base as well as some patchy left lung base infiltrates. She has continued to have high fevers, intermittent hypotension and diarrhea. She remains pancytopenic with  neutropenia.   Review of Systems: Constitutional: positive for anorexia, fevers and weight loss, negative for chills and sweats Eyes: negative Ears, nose, mouth, throat, and face: negative Respiratory: positive for cough, pleurisy/chest pain and sputum, negative for hemoptysis and wheezing Cardiovascular: negative Gastrointestinal: positive for abdominal pain and diarrhea, negative for nausea and vomiting Genitourinary:negative  Past Medical History  Diagnosis Date  . Urinary tract infection   . Anxiety 2007    SHORT  COURSE OF MEDS  . Anemia     TAKING FE  . Infection     UIT X 1  . Infection     TRICH X 1  . Infection     YEAST X1  . 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  . Leukemia   . Ovarian cyst 12-26-2013    Right side  . Pneumonia   . AML (acute myelogenous leukemia)   . Nephrolithiasis 05/21/2014    History  Substance Use Topics  . Smoking status: Never Smoker   . Smokeless tobacco: Never Used  . Alcohol Use: No    Family History  Problem Relation Age of Onset  . Anesthesia problems Neg Hx   . Alcohol abuse Mother   . Lupus Brother    No Known Allergies  OBJECTIVE: Blood pressure 84/35, pulse 111, temperature 100.9 F (38.3 C), temperature source Core (Comment), resp. rate 30, height $RemoveBe'4\' 11"'fkyeOlTKx$  (1.499 m), weight 85 lb 1.6 oz (38.6 kg), last menstrual period 04/27/2014, SpO2 100 %. General: she is thin but calm and in no distress Skin: no rash. Alopecia. Oral: She is tender along her left lower gum line but no oropharyngeal lesions are noted Lungs: clear anteriorly. Diminished breath sounds on right posteriorly with E to A changes Cor: tachycardic but regular S1 and S2 with no murmurs Right anterior chest Port-A-Cath site appears normal Abdomen: mild diffuse tenderness, soft with absent bowel sounds  Lab Results Lab Results  Component Value Date   WBC 0.8* 05/21/2014   HGB 9.0* 05/21/2014   HCT 25.0* 05/21/2014   MCV 80.4 05/21/2014   PLT 15* 05/21/2014    Lab Results  Component Value Date   CREATININE 0.53 05/21/2014   BUN 5* 05/21/2014   NA 135* 05/21/2014   K 3.0* 05/21/2014   CL 106 05/21/2014   CO2 19 05/21/2014    Lab Results  Component Value Date   ALT 14 05/20/2014   AST 13 05/20/2014   ALKPHOS 45 05/20/2014   BILITOT 0.8 05/20/2014     Microbiology: Recent Results (from the past 240 hour(s))  Urine culture     Status: None   Collection Time: 05/16/14  6:36 PM  Result Value Ref Range Status   Specimen Description URINE, RANDOM   Final   Special Requests NONE  Final   Culture  Setup Time   Final    05/17/2014 01:15 Performed at [place] Performed at [facility]   Final   Culture NO GROWTH Performed at [facility]   Final   Report Status 05/18/2014 FINAL  Final  Culture, blood (x 2)     Status: None (Preliminary result)   Collection Time: 05/16/14  7:56 PM  Result Value Ref Range Status   Specimen Description BLOOD RIGHT HAND  Final   Special Requests BOTTLES DRAWN AEROBIC ONLY 5CC  Final   Culture  Setup Time   Final    05/17/2014 00:43 Performed  at [facility]   Final           BLOOD CULTURE RECEIVED NO GROWTH TO DATE CULTURE WILL BE HELD FOR 5 DAYS BEFORE ISSUING A FINAL NEGATIVE REPORT Performed at [facility]    Report Status PENDING  Incomplete  Culture, blood (x 2)     Status: None (Preliminary result)   Collection Time: 05/16/14  7:57 PM  Result Value Ref Range Status   Specimen Description BLOOD LEFT ARM  Final   Special Requests BOTTLES DRAWN AEROBIC AND ANAEROBIC 10CC  Final   Culture  Setup Time   Final    05/17/2014 12:26 Performed at [facility]    Culture   Final           BLOOD CULTURE RECEIVED NO GROWTH TO DATE CULTURE WILL BE HELD FOR 5 DAYS BEFORE ISSUING A FINAL NEGATIVE REPORT Performed at [facility]    Report Status PENDING  Incomplete  MRSA PCR Screening     Status: None   Collection Time: 05/16/14 11:14 PM  Result Value Ref Range Status   MRSA by PCR NEGATIVE NEGATIVE Final    Comment:        The GeneXpert MRSA Assay (FDA approved for NASAL specimens only), is one component of a comprehensive MRSA colonization surveillance program. It is not intended to diagnose MRSA infection nor to guide or monitor treatment for MRSA infections.   Clostridium Difficile by PCR     Status: Abnormal   Collection Time: 05/17/14  8:44 AM  Result Value Ref Range Status   C  difficile by pcr POSITIVE (A) NEGATIVE Final    Comment: CRITICAL RESULT CALLED TO, READ BACK BY AND VERIFIED WITH: WORK,W RN 05/17/14 2022 [place] Performed at [facility]   Respiratory virus panel (routine influenza)     Status: Abnormal   Collection Time: 05/17/14  9:04 AM  Result Value Ref Range Status   Source - RVPAN NASAL WASHINGS  Corrected    Comment: CORRECTED ON 11/23 AT 2119: PREVIOUSLY REPORTED AS NASAL WASHINGS   Respiratory Syncytial Virus A NOT DETECTED  Final   Respiratory Syncytial Virus B NOT DETECTED  Final   Influenza A NOT DETECTED  Final   Influenza B NOT DETECTED  Final   Parainfluenza 1 NOT DETECTED  Final   Parainfluenza 2 NOT DETECTED  Final   Parainfluenza 3 NOT DETECTED  Final   Metapneumovirus NOT DETECTED  Final   Rhinovirus DETECTED (A)  Final   Adenovirus NOT DETECTED  Final   Influenza A H1 NOT DETECTED  Final   Influenza A H3 NOT DETECTED  Final    Comment: (NOTE)       Normal Reference Range for each Analyte: NOT DETECTED Testing performed using the Luminex xTAG Respiratory Viral Panel test kit. The analytical performance characteristics of this assay have been determined by [facility].  The modifications have not been cleared or approved by the FDA. This assay has been validated pursuant to the CLIA regulations and is used for clinical purposes. Performed at [facility], sputum-assessment     Status: None   Collection Time: 05/17/14  1:46 PM  Result Value Ref Range Status   Specimen Description SPUTUM  Final   Special Requests NONE  Final   Sputum evaluation   Final    THIS SPECIMEN IS ACCEPTABLE. RESPIRATORY CULTURE REPORT TO FOLLOW.   Report Status 05/17/2014 FINAL  Final  Culture, respiratory (NON-Expectorated)     Status: None   Collection Time: 05/17/14  1:46 PM  Result Value Ref Range Status   Specimen Description SPUTUM  Final   Special Requests NONE  Final   Gram Stain   Final    NO WBC  SEEN RARE SQUAMOUS EPITHELIAL CELLS PRESENT FEW GRAM NEGATIVE RODS RARE GRAM POSITIVE COCCI IN PAIRS RARE GRAM POSITIVE RODS    Culture   Final    NORMAL OROPHARYNGEAL FLORA Performed at [facility]    Report Status 05/19/2014 FINAL  Final  Culture, Urine     Status: None   Collection Time: 05/20/14 11:12 AM  Result Value Ref Range Status   Specimen Description URINE, CATHETERIZED  Final   Special Requests Immunocompromised  Final   Culture  Setup Time   Final    05/20/2014 15:31 Performed at [place] Performed at [facility]   Final   Culture NO GROWTH Performed at [facility]   Final   Report Status 05/21/2014 FINAL  Final    Michel Bickers, MD [place] [facility] [phone] pager   [phone] cell 05/21/2014, 1:21 PM

## 2014-05-21 NOTE — Progress Notes (Addendum)
PULMONARY / CRITICAL CARE MEDICINE   Name: Carla Haas MRN: [id number] DOB: May 17, 1986    ADMISSION DATE:  05/16/2014  REFERRING MD :  EDP  CHIEF COMPLAINT:  Hypotension and weakness  INITIAL PRESENTATION:  28 yo female presented with fever, abdominal pain, headache, hypotension. 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  She is followed at [facility] by heme/onc.    SIGNIFICANT EVENTS: 11/20 PLT transfusion at [facility] 11/21 to ED with PNA and septic shock, transfuse 2 units PRBC for Hb 6.6  - 11/21 CT abd/pelvis >> RLL consolidation, patch LLL ASD, mod hiatal hernia, b/l non obstructive renal stones 11/22 C diff positive 11/24 +rhinovirus 11/25 - a bit better, still feels weak   SUBJECTIVE/OVERNIGHT/INTERVAL HX 05/21/14   went back on pressors briefly last night. Now bp soft. C diff diarrhea ongoing ++.  Ongoing fevers. ID consult called.   NSTEMI +  With bNP indicating systolic chf likely nos  Pancyptoenia and coagulopathy and blood given last night.   Phos low this AM.   She does not feel too well  VITAL SIGNS: Temp:  [97.9 F (36.6 C)-104 F (40 C)] 101.3 F (38.5 C) (11/26 1100) Pulse Rate:  [95-134] 122 (11/26 1100) Resp:  [6-46] 46 (11/26 1100) BP: (65-106)/(23-72) 91/54 mmHg (11/26 1100) SpO2:  [83 %-100 %] 100 % (11/26 1100) INTAKE / OUTPUT:  Intake/Output Summary (Last 24 hours) at 05/21/14 1124 Last data filed at 05/21/14 1100  Gross per 24 hour  Intake   4184 ml  Output   1000 ml  Net   3184 ml    PHYSICAL EXAMINATION: General: no distress at rest. Looks very deconditioned Neuro:Awake and follows commands HEENT: OP clear,  Cardiovascular: regular, no murmur Lungs:no wheezes Abdomen: soft, non tender Musculoskeletal: no edema Skin: no rashes  LABS: PULMONARY No results for input(s): PHART, PCO2ART, PO2ART, HCO3, TCO2, O2SAT in the last 168  hours.  Invalid input(s): PCO2, PO2  CBC  Recent Labs Lab 05/20/14 0540 05/20/14 2050 05/21/14 0500  HGB 8.1* 7.5* 9.0*  HCT 21.9* 20.6* 25.0*  WBC 0.2* 0.6* 0.8*  PLT 21* 14* 15*    COAGULATION  Recent Labs Lab 05/16/14 1956 05/18/14 0515  INR 1.85* 2.08*    CARDIAC   Recent Labs Lab 05/16/14 1956 05/20/14 2050 05/21/14 0500  TROPONINI 0.44* 1.05* 0.65*    Recent Labs Lab 05/21/14 0500  PROBNP 14679.0*     CHEMISTRY  Recent Labs Lab 05/19/14 0313 05/19/14 0500 05/20/14 0540 05/20/14 2050 05/21/14 0500  NA 143 140 136* 138 135*  K 3.0* 2.9* 2.9* 3.0* 3.0*  CL 112 109 104 106 106  CO2 20 20 20 19 19   GLUCOSE 96 120* 106* 88 90  BUN 6 5* 3* 3* 5*  CREATININE 0.44* 0.53 0.47* 0.48* 0.53  CALCIUM 7.8* 7.8* 7.6* 7.5* 7.4*  MG  --   --   --   --  1.5  PHOS  --   --   --   --  0.5*   Estimated Creatinine Clearance: 63.8 mL/min (by C-G formula based on Cr of 0.53).   LIVER  Recent Labs Lab 05/16/14 1307 05/16/14 1956 05/18/14 0515 05/20/14 2050  AST 24 19  --  13  ALT 28 20  --  14  ALKPHOS 108 76  --  45  BILITOT 0.8 0.5  --  0.8  PROT 7.2 5.1*  --  4.8*  ALBUMIN 3.7 2.5*  --  1.9*  INR  --  1.85* 2.08*  --      INFECTIOUS  Recent Labs Lab 05/16/14 2115 05/20/14 2030 05/21/14 0500  LATICACIDVEN 3.1* 1.2 1.3  PROCALCITON  --  14.08 10.88     ENDOCRINE CBG (last 3)  No results for input(s): GLUCAP in the last 72 hours.       IMAGING x48h Dg Chest Port 1 View  05/21/2014   CLINICAL DATA:  Fever and pleural effusions. Hypertension. Leukemia.  EXAM: PORTABLE CHEST - 1 VIEW  COMPARISON:  Single view of the chest 05/20/2014 and 05/19/2014.  FINDINGS: Right effusion and basilar airspace disease compatible with pneumonia persist and appear increased since the 05/19/2014 exam. There is also left basilar airspace disease which appears increased since yesterday's examination. No pneumothorax is identified. There is cardiomegaly.   IMPRESSION: Increased left basilar airspace disease worrisome for pneumonia.  Right effusion basilar airspace disease compatible with pneumonia appear increased since the exam 2 days ago.   Electronically Signed   By: Inge Rise M.D.   On: 05/21/2014 08:31   Dg Chest Port 1 View  05/20/2014   CLINICAL DATA:  Fever, Port-A-Cath  EXAM: PORTABLE CHEST - 1 VIEW  COMPARISON:  05/19/2014  FINDINGS: Cardiomegaly again noted. Stable right Port-A-Cath position. Persistent small right pleural effusion with right lower lobe atelectasis or infiltrate. No pulmonary edema.  IMPRESSION: Stable right Port-A-Cath position. Persistent small right pleural effusion with right lower lobe atelectasis or infiltrate. No pulmonary edema.   Electronically Signed   By: Lahoma Crocker M.D.   On: 05/20/2014 10:56       ASSESSMENT / PLAN:  PULMONARY A:  RLL HCAP with associated Community related rhinovirus   05/21/14 - maintaining work of breathing on [place] but CXR worrisome for new loculated right pleural effusion  P:   CT chest rule out loculated effusion right side (miught explain fevers) Oxygen to keep SpO2 > 92%   CARDIOVASCULAR Rt chest port  A:  Septic shock (s/p hydrocort and stopped due to cortisol 148)  05/21/14  -on and off pressors briefily since 05/20/14 AM. BNP and trop concerning for systolic CHF  P:  Pressors to keep SBP > 90 Continue IV fluids; give 500cc fluid bolus Defer CVC placement, reconsider to manage volume administration if she remains on pressors.   RENAL A:   Hypokalemia Hypomagnesemia Hypophosphatemia  P:   F/u BMET, UOP Replace K+, mag and phos  GASTROINTESTINAL A:   C diff colitis    - ongoing diarrhea 05/21/14 significant +  P:   Advance diet as tolerated Rx C diff - call ID 05/21/14 to optimize management  HEMATOLOGIC A:   Background  - Acute lymphoblastic leukemia, followed at [facility] induced pancytopenia  05/21/14 - ongoing  pancytopenia with coagulopathy, sp PRBC 05/20/14. WC rising but platelet unchagned    P:  Anemia: PRBC for hgb < 8gm% in view of NSTEMI Thrombocytopenia: platelet for count < 10K or if bleeding Leukpenia: ? Needs neupogen - but wc rising and ? Ok to give in leukemia setting (might need to check with oncology at [facility] 05/22/14) SCD's for DVT prophylaxis  INFECTIOUS  Blood cx 11/21 >> Urine cx 11/21 >> negative Respiratory viral panel 11/22 >> ++rhinovirus C diff 11/22 >> positive  A: Septic shock  Neutropenic fever RLL HCAP Community Acquired Rhinovirus C diff colitis   - on 05/21/24 - still with ongoijng fevrs despite  antimicrobial for above. Ongoing diarrhea and CXR concerning for loculated right effusion  P:   Vanco 11/21 >>  Cefepime 11/21 >>  Flagyl PO 11/22 >>  Vanco PO 11/23 >>  Fluconazole 11/23 (prophylaxis) >>  acyclovir 11/23 (prophylaxis) >>   Day 5 empiric abx for neutropenic fever / sepsis.   Call ID consult Dr Megan Salon to help with above Get CT chest rule out loculatee effusion     ENDOCRINE A:  Relative adrenal insufficiency. P:   Stopped  Solu-cortef as random cortisol 148  NEUROLOGIC A:   Acute encephalopathy 2nd to septic shock, resolved 11/24 P:   Monitor  FAMILY  - Updates: updated patient alone 11/24 and 05/21/14/ D/w DR Megan Salon at [place] family meet or Palliative Care meeting due by 05/23/14:    TODAY'S SUMMARY:  ID consult called. Ordering echo. Fluid bolus to help with bp but chf could be a problem. Replace mag and phos. Get CT chest. HEr prognosis is poor and she is remains critical    The patient is critically ill with multiple organ systems failure and requires high complexity decision making for assessment and support, frequent evaluation and titration of therapies, application of advanced monitoring technologies and extensive interpretation of multiple databases.   Critical Care Time devoted to  patient care services described in this note is  31  Minutes. This time reflects time of care of this signee Dr Brand Males. This critical care time does not reflect procedure time, or teaching time or supervisory time of PA/NP/Med student/Med Resident etc but could involve care discussion time    Dr. Brand Males, M.D., [facility].C.P Pulmonary and Critical Care Medicine Staff Physician [place] [facility] Pager: [phone], If no answer or between  15:00h - 7:00h: call [phone]  05/21/2014 11:43 AM

## 2014-05-21 NOTE — Progress Notes (Signed)
ANTIBIOTIC CONSULT NOTE - FOLLOW UP  Pharmacy Consult for Vancomycin and Cefepime Indication: Sepsis, PNA, febrile neutropenia  No Known Allergies  Patient Measurements: Height: 4\' 11"  (149.9 cm) Weight: 85 lb 1.6 oz (38.6 kg) IBW/kg (Calculated) : 43.2   Assessment: 28yoF with AML presenting with fever, abdominal pain. Last chemo treatment 2 wks ago. Hypotension/AMS while in ED did not respond to fluids and required Levophed. Received 2g cefepime in ED at 1400; ordered vancomycin 1000 mg x 1 for 1830. Started on levaquin 11/21-not resumed. Also on fluconazole and acyclovir PTA while neutropenic.  11/21 >> cefepime >> 11/21 >> vancomycin >> 11/25 11/22 >> Flagyl/Vanc po >> 11/23 >> Acyclovir as PTA >> 11/23 >> Fluconazole as PTA >>  Tmax 103.6 WBC 0.8; ANC = 0.4  Renal: SCr low, - overestimated d/t low weight Lactate: 3.5 > 3.1 > 1.3  11/21 blood: ngtd 11/21 urine: ng-final 11/22 sputum: acceptable, normal flora-final 11/21 CXR shows RLL infiltrate 11/22 RSV panel: + for Rhinovirus 11/22 Cdiff: positive 11/21 PCR: neg for MRSA 11/25 urine: sent  Today is day #6 Cefepime 2g q8h for sepsis/pneumonia.   Goal of Therapy:  Vancomycin trough level 15-20 mcg/ml  Cefepime dose for indication/renal function  Plan:  1.  Continue cefepime to 2g IV q8h.  2.  Follow up renal function & cultures, clinical course 3.  F/u if ID consulted and recommendations.   Hershal Coria, PharmD, BCPS Pager: [phone] 05/21/2014 9:27 AM

## 2014-05-22 DIAGNOSIS — J90 Pleural effusion, not elsewhere classified: Secondary | ICD-10-CM | POA: Insufficient documentation

## 2014-05-22 DIAGNOSIS — D6959 Other secondary thrombocytopenia: Secondary | ICD-10-CM

## 2014-05-22 DIAGNOSIS — T50905A Adverse effect of unspecified drugs, medicaments and biological substances, initial encounter: Secondary | ICD-10-CM

## 2014-05-22 DIAGNOSIS — R6520 Severe sepsis without septic shock: Secondary | ICD-10-CM

## 2014-05-22 LAB — BASIC METABOLIC PANEL
ANION GAP: 12 (ref 5–15)
BUN: 4 mg/dL — ABNORMAL LOW (ref 6–23)
CO2: 19 mEq/L (ref 19–32)
CREATININE: 0.57 mg/dL (ref 0.50–1.10)
Calcium: 7 mg/dL — ABNORMAL LOW (ref 8.4–10.5)
Chloride: 106 mEq/L (ref 96–112)
GFR calc non Af Amer: >90 mL/min (ref >90)
Glucose, Bld: 73 mg/dL (ref 70–99)
Potassium: 3 mEq/L — ABNORMAL LOW (ref 3.7–5.3)
Sodium: 137 mEq/L (ref 137–147)

## 2014-05-22 LAB — PROCALCITONIN: Procalcitonin: 6.7 ng/mL

## 2014-05-22 LAB — CBC WITH DIFFERENTIAL/PLATELET
Basophils Absolute: 0 10*3/uL (ref 0.0–0.1)
Basophils Relative: 0 % (ref 0–1)
EOS ABS: 0 10*3/uL (ref 0.0–0.7)
Eosinophils Relative: 0 % (ref 0–5)
HCT: 23.1 % — ABNORMAL LOW (ref 36.0–46.0)
Hemoglobin: 8.3 g/dL — ABNORMAL LOW (ref 12.0–15.0)
Lymphocytes Relative: 10 % — ABNORMAL LOW (ref 12–46)
Lymphs Abs: 0.2 10*3/uL — ABNORMAL LOW (ref 0.7–4.0)
MCH: 28.4 pg (ref 26.0–34.0)
MCHC: 35.9 g/dL (ref 30.0–36.0)
MCV: 79.1 fL (ref 78.0–100.0)
MONO ABS: 0.1 10*3/uL (ref 0.1–1.0)
Monocytes Relative: 3 % (ref 3–12)
NEUTROS PCT: 87 % — AB (ref 43–77)
Neutro Abs: 1.8 10*3/uL (ref 1.7–7.7)
PLATELETS: 12 10*3/uL — AB (ref 150–400)
RBC: 2.92 MIL/uL — ABNORMAL LOW (ref 3.87–5.11)
RDW: 14.2 % (ref 11.5–15.5)
WBC: 2.1 10*3/uL — ABNORMAL LOW (ref 4.0–10.5)

## 2014-05-22 LAB — TYPE AND SCREEN
ABO/RH(D): A POS
Antibody Screen: NEGATIVE
UNIT DIVISION: 0

## 2014-05-22 LAB — PROTIME-INR
INR: 1.95 — AB (ref 0.00–1.49)
Prothrombin Time: 22.4 seconds — ABNORMAL HIGH (ref 11.6–15.2)

## 2014-05-22 LAB — MAGNESIUM: Magnesium: 2 mg/dL (ref 1.5–2.5)

## 2014-05-22 LAB — PHOSPHORUS: Phosphorus: 1.6 mg/dL — ABNORMAL LOW (ref 2.3–4.6)

## 2014-05-22 MED ORDER — DIPHENHYDRAMINE HCL 50 MG/ML IJ SOLN: 25.0000 mg | Freq: Four times a day (QID) | INTRAMUSCULAR | Status: DC | PRN

## 2014-05-22 MED ORDER — VANCOMYCIN HCL 10 G IV SOLR
1250.0000 mg | Freq: Three times a day (TID) | INTRAVENOUS | Status: DC
  Administered 2014-05-22: 1250 mg via INTRAVENOUS
  Filled 2014-05-22 (×3): qty 1250

## 2014-05-22 MED ORDER — K PHOS MONO-SOD PHOS DI & MONO 155-852-130 MG PO TABS
500.0000 mg | ORAL_TABLET | Freq: Three times a day (TID) | ORAL | Status: DC
  Administered 2014-05-22: 500 mg via ORAL
  Filled 2014-05-22 (×2): qty 2

## 2014-05-22 MED ORDER — K PHOS MONO-SOD PHOS DI & MONO 155-852-130 MG PO TABS: 500.0000 mg | ORAL_TABLET | Freq: Three times a day (TID) | ORAL | Status: DC

## 2014-05-22 MED ORDER — MAGNESIUM OXIDE 400 (241.3 MG) MG PO TABS
800.0000 mg | ORAL_TABLET | Freq: Every day | ORAL | Status: DC
  Administered 2014-05-22: 800 mg via ORAL
  Filled 2014-05-22: qty 2

## 2014-05-22 MED ORDER — SODIUM CHLORIDE 0.9 % IV SOLN: 100.0000 mL | INTRAVENOUS | Status: DC

## 2014-05-22 MED ORDER — VANCOMYCIN 50 MG/ML ORAL SOLUTION: 500.0000 mg | Freq: Four times a day (QID) | ORAL | Status: DC

## 2014-05-22 MED ORDER — ACYCLOVIR 200 MG PO CAPS
400.0000 mg | ORAL_CAPSULE | Freq: Every day | ORAL | Status: DC
Start: 2014-05-22 — End: 2014-07-09

## 2014-05-22 MED ORDER — SODIUM CHLORIDE 0.9 % IJ SOLN: 10.0000 mL | Freq: Two times a day (BID) | INTRAMUSCULAR | Status: DC

## 2014-05-22 MED ORDER — HYDROCODONE-ACETAMINOPHEN 10-325 MG PO TABS: 1.0000 | ORAL_TABLET | Freq: Four times a day (QID) | ORAL | Status: DC | PRN

## 2014-05-22 MED ORDER — VORICONAZOLE 200 MG IV SOLR: 4.0000 mg/kg | Freq: Two times a day (BID) | INTRAVENOUS | Status: DC

## 2014-05-22 MED ORDER — SODIUM CHLORIDE 0.9 % IV SOLN
6.0000 mg/kg | Freq: Two times a day (BID) | INTRAVENOUS | Status: DC
  Administered 2014-05-22: 230 mg via INTRAVENOUS
  Filled 2014-05-22: qty 230

## 2014-05-22 MED ORDER — VORICONAZOLE 200 MG IV SOLR: 6.0000 mg/kg | Freq: Two times a day (BID) | INTRAVENOUS | Status: DC

## 2014-05-22 MED ORDER — DEXTROSE 5 % IV SOLN: 2.0000 g | Freq: Three times a day (TID) | INTRAVENOUS | Status: DC

## 2014-05-22 MED ORDER — ALTEPLASE 2 MG IJ SOLR
2.0000 mg | Freq: Once | INTRAMUSCULAR | Status: AC
  Administered 2014-05-22: 2 mg
  Filled 2014-05-22: qty 2

## 2014-05-22 MED ORDER — SODIUM CHLORIDE 0.9 % IJ SOLN: 10.0000 mL | INTRAMUSCULAR | Status: DC | PRN

## 2014-05-22 MED ORDER — METRONIDAZOLE IN NACL 5-0.79 MG/ML-% IV SOLN
500.0000 mg | Freq: Three times a day (TID) | INTRAVENOUS | Status: DC
Start: 2014-05-22 — End: 2014-07-09

## 2014-05-22 MED ORDER — SODIUM CHLORIDE 0.9 % IV SOLN: 1250.0000 mg | Freq: Three times a day (TID) | INTRAVENOUS | Status: DC

## 2014-05-22 MED ORDER — FENTANYL CITRATE 0.05 MG/ML IJ SOLN: 25.0000 ug | INTRAMUSCULAR | Status: DC | PRN

## 2014-05-22 NOTE — Progress Notes (Signed)
Patient ID: Carla Haas, female   DOB: 1985/12/24, 28 y.o.   MRN: [id number]         [facility]    Date of Admission:  05/16/2014           Day 7 cefepime        Day 6 metronidazole        Day 5 oral vancomycin  Principal Problem:   HCAP (healthcare-associated pneumonia) Active Problems:   Parapneumonic effusion   C. 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 sodium chloride   Intravenous Once  . sodium chloride   Intravenous Once  . acyclovir  400 mg Oral Daily  . antiseptic oral rinse  7 mL Mouth Rinse BID  . ceFEPime (MAXIPIME) IV  2 g Intravenous 3 times per day  . fluconazole  200 mg Oral Daily  . metronidazole  500 mg Intravenous Q8H  . ondansetron (ZOFRAN) IV  4 mg Intravenous Once  . sodium chloride  10-40 mL Intracatheter Q12H  . vancomycin  500 mg Oral 4 times per day    Subjective: She states that her diarrhea has improved some. She is still coughing up yellow sputum. Her right-sided pleuritic chest pain is a little bit worse today.  Review of Systems: Pertinent items are noted in HPI.  Past Medical History  Diagnosis Date  . Urinary tract infection   . Anxiety 2007    SHORT COURSE OF MEDS  . Anemia     TAKING FE  . Infection     UIT X 1  . Infection     TRICH X 1  . Infection     YEAST X1  . 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  . Leukemia   . Ovarian cyst 12-26-2013    Right side  . Pneumonia   . AML (acute myelogenous leukemia)   . Nephrolithiasis 05/21/2014    History  Substance Use Topics  . Smoking status: Never Smoker   . Smokeless tobacco: Never Used  . Alcohol Use: No    Family History  Problem Relation Age of Onset  . Anesthesia problems Neg Hx   . Alcohol abuse Mother   . Lupus Brother    No  Known Allergies  OBJECTIVE: Blood pressure 96/68, pulse 108, temperature 102.6 F (39.2 C), temperature source Core (Comment), resp. rate 27, height $RemoveBe'4\' 11"'YIhleDdGu$  (1.499 m), weight 85 lb 1.6 oz (38.6 kg), last menstrual period 04/27/2014, SpO2 94 %. General: She is off pressors but her blood pressure remains low. She is sitting up in a chair. Skin: No rash. Port-A-Cath site looks good Lungs: No change in posterior egophony Cor: Tachycardic but regular S1 and S2 with no murmurs Abdomen: Soft and nontender today     Lab Results Lab Results  Component Value Date   WBC 2.1* 05/22/2014   HGB 8.3* 05/22/2014   HCT 23.1* 05/22/2014   MCV 79.1 05/22/2014   PLT 12* 05/22/2014    Lab Results  Component Value Date   CREATININE 0.57 05/22/2014   BUN 4* 05/22/2014   NA 137 05/22/2014   K 3.0* 05/22/2014   CL 106 05/22/2014   CO2 19 05/22/2014    Lab Results  Component Value Date   ALT 14 05/20/2014  AST 13 05/20/2014   ALKPHOS 45 05/20/2014   BILITOT 0.8 05/20/2014     Microbiology: Recent Results (from the past 240 hour(s))  Urine culture     Status: None   Collection Time: 05/16/14  6:36 PM  Result Value Ref Range Status   Specimen Description URINE, RANDOM  Final   Special Requests NONE  Final   Culture  Setup Time   Final    05/17/2014 01:15 Performed at [place] Performed at [facility]   Final   Culture NO GROWTH Performed at [facility]   Final   Report Status 05/18/2014 FINAL  Final  Culture, blood (x 2)     Status: None (Preliminary result)   Collection Time: 05/16/14  7:56 PM  Result Value Ref Range Status   Specimen Description BLOOD RIGHT HAND  Final   Special Requests BOTTLES DRAWN AEROBIC ONLY 5CC  Final   Culture  Setup Time   Final    05/17/2014 00:43 Performed at [facility]    Culture   Final           BLOOD CULTURE RECEIVED NO GROWTH TO DATE CULTURE WILL BE HELD FOR 5 DAYS BEFORE ISSUING  A FINAL NEGATIVE REPORT Performed at [facility]    Report Status PENDING  Incomplete  Culture, blood (x 2)     Status: None (Preliminary result)   Collection Time: 05/16/14  7:57 PM  Result Value Ref Range Status   Specimen Description BLOOD LEFT ARM  Final   Special Requests BOTTLES DRAWN AEROBIC AND ANAEROBIC 10CC  Final   Culture  Setup Time   Final    05/17/2014 12:26 Performed at [facility]    Culture   Final           BLOOD CULTURE RECEIVED NO GROWTH TO DATE CULTURE WILL BE HELD FOR 5 DAYS BEFORE ISSUING A FINAL NEGATIVE REPORT Performed at [facility]    Report Status PENDING  Incomplete  MRSA PCR Screening     Status: None   Collection Time: 05/16/14 11:14 PM  Result Value Ref Range Status   MRSA by PCR NEGATIVE NEGATIVE Final    Comment:        The GeneXpert MRSA Assay (FDA approved for NASAL specimens only), is one component of a comprehensive MRSA colonization surveillance program. It is not intended to diagnose MRSA infection nor to guide or monitor treatment for MRSA infections.   Clostridium Difficile by PCR     Status: Abnormal   Collection Time: 05/17/14  8:44 AM  Result Value Ref Range Status   C difficile by pcr POSITIVE (A) NEGATIVE Final    Comment: CRITICAL RESULT CALLED TO, READ BACK BY AND VERIFIED WITH: WORK,W RN 05/17/14 2022 [place] Performed at [facility]   Respiratory virus panel (routine influenza)     Status: Abnormal   Collection Time: 05/17/14  9:04 AM  Result Value Ref Range Status   Source - RVPAN NASAL WASHINGS  Corrected    Comment: CORRECTED ON 11/23 AT 2119: PREVIOUSLY REPORTED AS NASAL WASHINGS   Respiratory Syncytial Virus A NOT DETECTED  Final   Respiratory Syncytial Virus B NOT DETECTED  Final   Influenza A NOT DETECTED  Final   Influenza B NOT DETECTED  Final   Parainfluenza 1 NOT DETECTED  Final   Parainfluenza 2 NOT DETECTED  Final   Parainfluenza 3 NOT DETECTED  Final    Metapneumovirus NOT DETECTED  Final   Rhinovirus DETECTED (A)  Final   Adenovirus NOT DETECTED  Final   Influenza A H1 NOT DETECTED  Final   Influenza A H3 NOT DETECTED  Final    Comment: (NOTE)       Normal Reference Range for each Analyte: NOT DETECTED Testing performed using the Luminex xTAG Respiratory Viral Panel test kit. The analytical performance characteristics of this assay have been determined by [facility].  The modifications have not been cleared or approved by the FDA. This assay has been validated pursuant to the CLIA regulations and is used for clinical purposes. Performed at [facility], sputum-assessment     Status: None   Collection Time: 05/17/14  1:46 PM  Result Value Ref Range Status   Specimen Description SPUTUM  Final   Special Requests NONE  Final   Sputum evaluation   Final    THIS SPECIMEN IS ACCEPTABLE. RESPIRATORY CULTURE REPORT TO FOLLOW.   Report Status 05/17/2014 FINAL  Final  Culture, respiratory (NON-Expectorated)     Status: None   Collection Time: 05/17/14  1:46 PM  Result Value Ref Range Status   Specimen Description SPUTUM  Final   Special Requests NONE  Final   Gram Stain   Final    NO WBC SEEN RARE SQUAMOUS EPITHELIAL CELLS PRESENT FEW GRAM NEGATIVE RODS RARE GRAM POSITIVE COCCI IN PAIRS RARE GRAM POSITIVE RODS    Culture   Final    NORMAL OROPHARYNGEAL FLORA Performed at [facility]    Report Status 05/19/2014 FINAL  Final  Culture, Urine     Status: None   Collection Time: 05/20/14 11:12 AM  Result Value Ref Range Status   Specimen Description URINE, CATHETERIZED  Final   Special Requests Immunocompromised  Final   Culture  Setup Time   Final    05/20/2014 15:31 Performed at [place] Performed at [facility]   Final   Culture NO GROWTH Performed at [facility]   Final   Report Status 05/21/2014 FINAL  Final     Assessment: Her diarrhea is improving and she is off pressors but she remains febrile with worsening pneumonia. I will continue cefepime but will restart IV vancomycin and change fluconazole prophylaxis 2 voriconazole to provide better coverage of Aspergillus and other molds. I'm still worried about the right parapneumonic effusion but it is not as big as I had expected and she will require platelet transfusions before thoracentesis. This will need to be considered if she is not improving soon. Her absolute neutrophil count is up today. I'm not sure that there will be significant benefit to continuing the G-CSF much longer.  Plan: 1. Repeat blood and sputum cultures 2. Serum galactomannan assay looking for evidence of invasive aspergillosis 3. Continue cefepime 4. Restart vancomycin 5. Change fluconazole prophylaxis to full dose voriconazole 6. Continue IV metronidazole and oral vancomycin 7. Continue acyclovir prophylaxis  Michel Bickers, MD [facility] [place] [phone] pager   [phone] cell 05/22/2014, 11:27 AM

## 2014-05-22 NOTE — Progress Notes (Signed)
ANTIBIOTIC CONSULT NOTE - FOLLOW UP  Pharmacy Consult for Vancomycin, Cefepime, Voriconazole Indication: Sepsis, PNA, febrile neutropenia  No Known Allergies  Patient Measurements: Height: 4\' 11"  (149.9 cm) Weight: 85 lb 1.6 oz (38.6 kg) IBW/kg (Calculated) : 43.2   Assessment: 28yoF with AML presenting with fever, abdominal pain. Last chemo treatment 2 wks ago. Hypotension/AMS while in ED did not respond to fluids and required Levophed. Received 2g cefepime in ED at 1400; ordered vancomycin 1000 mg x 1 for 1830. Started on levaquin 11/21-not resumed. Also on fluconazole and acyclovir PTA while neutropenic.  11/21 >> cefepime >> 11/21 >> vancomycin >> 11/25 11/22 >> Flagyl PO >> 11/26 11/26 >> Flagyl IV >> 11/22 >> Vanco po >> 11/23 >> Acyclovir as PTA >>  11/23 >> Fluconazole as PTA >> 11/27 11/27 >> Vanco IV >> 11/27 >> Voriconazole >>  Tmax 102 WBC 2.1; ANC = 1.8 (improved) Renal: SCr low, - overestimated d/t low weight Lactate: 3.5 > 3.1 > 1.3  11/21 blood: ngtd 11/21 urine: ng-final 11/22 sputum: acceptable, normal flora-final 11/21 CXR shows RLL infiltrate 11/22 RSV panel: + for Rhinovirus 11/22 Cdiff: positive 11/21 PCR: neg for MRSA 11/25 urine: sent  Drug level info/dosing changes 11/22 increase cefepime 2g q8h for febrile neutropenia 11/23 Vanc trough @ 2000 = 7.2 mcg/mL on 500mg  q8h, incr to 1g q8h 11/25 Vanc trough at 1300 = 12.4 on 1g Q8H  Goal of Therapy:  Vancomycin trough level 15-20 mcg/ml  Appropriate antibiotic dosing for renal function; eradication of infection  Plan:   Restart Vancomycin 1250mg  IV Q8H  Start Voriconazole 6mg /kg (230mg ) IV Q12H x2 doses, then 4mg /kg (150mg ) IV Q12H  Continue Cefepime to 2g IV q8h.   Follow up renal function & cultures, clinical course  Obtain vancomycin trough at steady state  Kizzie Furnish, PharmD Pager: [phone] 05/22/2014 12:11 PM

## 2014-05-22 NOTE — Discharge Summary (Addendum)
Physician Discharge Summary  Patient ID: Carla Haas MRN: [id number] DOB/AGE: 1986-02-11 28 y.o.  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 difficile colitis   Thrombocytopenia due to drugs  HPI: 28 year old with diagnosis of AML who originally presented to the ED with fever and abdominal, last chemo treatment is 2 wks ago. In the ED was noted to have hypotension and AMS that resolved immediately once BP improved and patient became perfectly awake and appropriate. Headache seemed to improve with improvement of BP. CXR performed and was positive for RLL infiltrate. WBC is 0.5 and platelet are 25. BP failed to improve with fluid and thus levo was started and PCCM was called to admit. Hospital Course:   SIGNIFICANT EVENTS: 11/20 PLT transfusion at [facility] 11/21 to ED with PNA and septic shock, transfuse 2 units PRBC for Hb 6.6 - 11/21 CT abd/pelvis >> RLL consolidation, patch LLL ASD, mod hiatal hernia, b/l non obstructive renal stones 11/22 C diff positive 11/24 +rhinovirus 11/26 ID consult, CT chest > multi-focal pneumonia, R effusion, ? Aspergillosis  Hospital Course:  Carla Haas was admitted to [facility] hospital on 05/16/2014 for septic shock, neutropenic fever. She was treated empirically with broad-spectrum antibiotics including IV vancomycin, IV cefepime, IV Flagyl, oral vancomycin, fluconazole, and acyclovir. Vasopressors were used for the first 2-3 days of her hospital stay for septic shock and she was treated with IV fluids aggressively. She did have diarrhea and during her hospital stay she was found to be C. difficile positive. She briefly required vasopressors again on the evening of the November  25th through the early morning of November 26 but these were weaned again. Infectious diseases was consult on November 26 because of 6 days of ongoing fever. At this point blood culture data had been negative. The only positive infectious workup we had was a positive C. difficile stool antigen and a respiratory viral panel positive for rhinovirus. The CT scan of her chest was performed on November 26 demonstrating multifocal pneumonia and a moderate size right-sided pleural effusion. Infectious diseases recommended changing antibiotics to voriconazole on November 27 due to ongoing fevers and concern for aspergillus disease. Serum galactomannan was sent on November 27.  We requested transfer to [facility] on November 27 per patient request. At this point she does not require ICU level care as she has been off of vasopressor agents for more than 24 hours. She is stable for transfer to a medical floor at their hospital.    She will remain on vanc and cefepime for HCAP in the setting of neutropenia, voriconazole for empiric mold treatment, and oral vanc and IV flagyl for c.diff  TODAY'S SUMMARY:  Remains febrile but decrease in stools and reports feeling better. Transfer to [facility] where she gets her care now that she is stabilized  Labs at discharge Lab Results  Component Value Date   CREATININE 0.57 05/22/2014   BUN 4* 05/22/2014   NA 137 05/22/2014   K 3.0* 05/22/2014   CL 106 05/22/2014   CO2 19 05/22/2014   Lab Results  Component Value Date   WBC 2.1* 05/22/2014   HGB 8.3* 05/22/2014   HCT 23.1* 05/22/2014   MCV 79.1 05/22/2014   PLT 12* 05/22/2014   Lab  Results  Component Value Date   ALT 14 05/20/2014   AST 13 05/20/2014   ALKPHOS 45 05/20/2014   BILITOT 0.8 05/20/2014   Lab Results  Component Value Date   INR 1.95* 05/22/2014   INR 2.08* 05/18/2014   INR 1.85* 05/16/2014    Current radiology studies Ct Chest Wo Contrast  05/21/2014   CLINICAL DATA:   28 year old female with history of fever and hypotension, with right lower lobe pneumonia in the setting of acute lymphoblastic leukemia (ALL). Recent history of chemotherapy administered 2 weeks prior to admission with associated chemotherapy induced pancytopenia.  EXAM: CT CHEST WITHOUT CONTRAST  TECHNIQUE: Multidetector CT imaging of the chest was performed following the standard protocol without IV contrast.  COMPARISON:  Chest CT 12/17/2011.  FINDINGS: Mediastinum: Right internal jugular double-lumen porta cath with tip terminating in the right atrium. Previously noted infiltrative nodal mass in the anterior mediastinum has significantly decreased compared to the prior examination, although there continues to be a large amount of abnormal nodal tissue in the anterior mediastinum, best appreciated on image 28 of series 2, where it measures approximately 1.8 x 5.6 cm. There appears to be some persistent supraclavicular lymphadenopathy bilaterally, however, this is poorly evaluated on today's non contrast CT examination. Heart size is normal. There is no significant pericardial fluid, thickening or pericardial calcification. Moderate hiatal hernia.  Lungs/Pleura: Large area of airspace consolidation with air bronchograms in the right lower lobe, compatible pneumonia. There are other patchy areas of thickening of the peribronchovascular interstitium with some peribronchovascular ground-glass attenuation noted in the lungs bilaterally, most evident in the left lower and left upper lobes, presumably endobronchial spread of infection. In addition, there is an ovoid shaped 3.7 x 1.3 cm mass like area of consolidation in the left upper lobe, best appreciated on images 25 of series 2 and 5, with some surrounding ground-glass attenuation airspace disease, best appreciated on images 22-28 of series 5. Some interlobular septal thickening is also noted in the lungs bilaterally. Small left and moderate right pleural  effusions layering dependently.  Upper Abdomen: Unremarkable.  Musculoskeletal: There are no aggressive appearing lytic or blastic lesions noted in the visualized portions of the skeleton.  IMPRESSION: 1. The appearance the chest is compatible with multilobar pneumonia. There is a lobar consolidation in the right lower lobe (with some sparing of the superior segment), in addition to patchy areas of apparent bronchopneumonia, most evident in the left lung, presumably secondary to endobronchial spread of disease. Much of these findings could be explicable by typical bacterial organisms. However, there is one area in the left upper lobe where there is a somewhat mass-like region of airspace consolidation with surrounding ground-glass attenuation. Given the patient's immunosuppressed status, the possibility of an atypical organism such as angioinvasive aspergillosis warrants consideration, and clinical correlation is suggested. 2. Moderate right and small left pleural effusions layering dependently. 3. There has been some regression of anterior mediastinal adenopathy compared to the prior study, as above. There continues to be some supraclavicular lymphadenopathy, most notably on the right side, poorly evaluated on today's non contrast CT examination. 4. Additional findings, as above.   Electronically Signed   By: Vinnie Langton M.D.   On: 05/21/2014 16:13   Dg Chest Port 1 View  05/21/2014   CLINICAL DATA:  Fever and pleural effusions. Hypertension. Leukemia.  EXAM: PORTABLE CHEST - 1 VIEW  COMPARISON:  Single view of the chest 05/20/2014 and 05/19/2014.  FINDINGS: Right effusion and basilar airspace disease compatible with  pneumonia persist and appear increased since the 05/19/2014 exam. There is also left basilar airspace disease which appears increased since yesterday's examination. No pneumothorax is identified. There is cardiomegaly.  IMPRESSION: Increased left basilar airspace disease worrisome for  pneumonia.  Right effusion basilar airspace disease compatible with pneumonia appear increased since the exam 2 days ago.   Electronically Signed   By: Inge Rise M.D.   On: 05/21/2014 08:31    Disposition:  [facility]    Transfer medications:  . sodium chloride   Intravenous Once  . sodium chloride   Intravenous Once  . acyclovir  400 mg Oral Daily  . antiseptic oral rinse  7 mL Mouth Rinse BID  . ceFEPime (MAXIPIME) IV  2 g Intravenous 3 times per day  . magnesium oxide  800 mg Oral Daily  . metronidazole  500 mg Intravenous Q8H  . ondansetron (ZOFRAN) IV  4 mg Intravenous Once  . phosphorus  500 mg Oral TID  . sodium chloride  10-40 mL Intracatheter Q12H  . vancomycin  1,250 mg Intravenous Q8H  . vancomycin  500 mg Oral 4 times per day  . voriconazole (VFEND - PT WT </= 85 kg) IV  6 mg/kg Intravenous Q12H   Followed by  . [START ON 05/23/2014] voriconazole (VFEND - PT WT </= 85 kg) IV  4 mg/kg Intravenous Q12H   PRN Meds:.acetaminophen, diphenhydrAMINE, fentaNYL, HYDROcodone-acetaminophen, ondansetron (ZOFRAN) IV, sodium chloride  Discharged Condition: fair  Time spent on discharge greater than 40 minutes.  Vital signs at Discharge. Temp:  [100.8 F (38.2 C)-103.3 F (39.6 C)] 103.3 F (39.6 C) (11/27 1300) Pulse Rate:  [99-118] 111 (11/27 1300) Resp:  [18-47] 40 (11/27 1300) BP: (85-113)/(40-83) 89/40 mmHg (11/27 1300) SpO2:  [94 %-100 %] 98 % (11/27 1300) Office follow up Special Information or instructions. [facility] will assume her care as of 05/22/14. Signed: [name] Minor ACNP Maryanna Shape PCCM Pager [phone] till 3 pm If no answer page [phone] 05/22/2014, 1:35 PM   Roselie Awkward, MD [place] [facility] Pager: [phone] Cell: [phone] If no response, call [phone]

## 2014-05-22 NOTE — Plan of Care (Signed)
Problem: Consults Goal: Diabetes Guidelines if Diabetic/Glucose > 140 If diabetic or lab glucose is > 140 mg/dl - Initiate Diabetes/Hyperglycemia Guidelines & Document Interventions  Outcome: Not Applicable Date Met:  78/71/83

## 2014-05-22 NOTE — Progress Notes (Signed)
Report called to receiving RN at cancer center at [facility]. Report given to [facility]. [facility] here to transfer patient now. Patient and family updated.

## 2014-05-22 NOTE — Progress Notes (Signed)
PULMONARY / CRITICAL CARE MEDICINE   Name: Carla Haas MRN: [id number] DOB: 1985/12/28    ADMISSION DATE:  05/16/2014  REFERRING MD :  EDP  CHIEF COMPLAINT:  Hypotension and weakness  INITIAL PRESENTATION:  28 yo female presented with fever, abdominal pain, headache, hypotension. 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  She is followed at [facility] by heme/onc.    SIGNIFICANT EVENTS: 11/20 PLT transfusion at [facility] 11/21 to ED with PNA and septic shock, transfuse 2 units PRBC for Hb 6.6  - 11/21 CT abd/pelvis >> RLL consolidation, patch LLL ASD, mod hiatal hernia, b/l non obstructive renal stones 11/22 C diff positive 11/24 +rhinovirus 11/26 ID consult   SUBJECTIVE/OVERNIGHT/INTERVAL HX Reports feeling better  VITAL SIGNS: Temp:  [100.8 F (38.2 C)-103.3 F (39.6 C)] 100.9 F (38.3 C) (11/27 1000) Pulse Rate:  [99-122] 114 (11/27 1000) Resp:  [18-47] 40 (11/27 1000) BP: (84-113)/(35-83) 93/50 mmHg (11/27 0900) SpO2:  [95 %-100 %] 95 % (11/27 1000) INTAKE / OUTPUT:  Intake/Output Summary (Last 24 hours) at 05/22/14 1005 Last data filed at 05/22/14 1000  Gross per 24 hour  Intake   3525 ml  Output   1001 ml  Net   2524 ml    PHYSICAL EXAMINATION: General: no distress at rest. Reports feeling better Neuro:Awake and follows commands HEENT: OP clear,  Cardiovascular: regular, no murmur Lungs:no wheezes Abdomen: soft, non tender Musculoskeletal: no edema Skin: no rashes  LABS: PULMONARY No results for input(s): PHART, PCO2ART, PO2ART, HCO3, TCO2, O2SAT in the last 168 hours.  Invalid input(s): PCO2, PO2  CBC  Recent Labs Lab 05/20/14 2050 05/21/14 0500 05/22/14 0610  HGB 7.5* 9.0* 8.3*  HCT 20.6* 25.0* 23.1*  WBC 0.6* 0.8* 2.1*  PLT 14* 15* 12*    COAGULATION  Recent Labs Lab 05/16/14 1956 05/18/14 0515 05/22/14 0610  INR 1.85* 2.08* 1.95*     CARDIAC    Recent Labs Lab 05/16/14 1956 05/20/14 2050 05/21/14 0500  TROPONINI 0.44* 1.05* 0.65*    Recent Labs Lab 05/21/14 0500  PROBNP 14679.0*     CHEMISTRY  Recent Labs Lab 05/19/14 0500 05/20/14 0540 05/20/14 2050 05/21/14 0500 05/22/14 0610  NA 140 136* 138 135* 137  K 2.9* 2.9* 3.0* 3.0* 3.0*  CL 109 104 106 106 106  CO2 20 20 19 19 19   GLUCOSE 120* 106* 88 90 73  BUN 5* 3* 3* 5* 4*  CREATININE 0.53 0.47* 0.48* 0.53 0.57  CALCIUM 7.8* 7.6* 7.5* 7.4* 7.0*  MG  --   --   --  1.5 2.0  PHOS  --   --   --  0.5* 1.6*   Estimated Creatinine Clearance: 63.8 mL/min (by C-G formula based on Cr of 0.57).   LIVER  Recent Labs Lab 05/16/14 1307 05/16/14 1956 05/18/14 0515 05/20/14 2050 05/22/14 0610  AST 24 19  --  13  --   ALT 28 20  --  14  --   ALKPHOS 108 76  --  45  --   BILITOT 0.8 0.5  --  0.8  --   PROT 7.2 5.1*  --  4.8*  --   ALBUMIN 3.7 2.5*  --  1.9*  --   INR  --  1.85* 2.08*  --  1.95*     INFECTIOUS  Recent Labs Lab 05/16/14 2115 05/20/14 2030 05/21/14 0500 05/22/14 0610  LATICACIDVEN 3.1*  1.2 1.3  --   PROCALCITON  --  14.08 10.88 6.70     ENDOCRINE CBG (last 3)  No results for input(s): GLUCAP in the last 72 hours.       IMAGING x48h Ct Chest Wo Contrast  05/21/2014   CLINICAL DATA:  28 year old female with history of fever and hypotension, with right lower lobe pneumonia in the setting of acute lymphoblastic leukemia (ALL). Recent history of chemotherapy administered 2 weeks prior to admission with associated chemotherapy induced pancytopenia.  EXAM: CT CHEST WITHOUT CONTRAST  TECHNIQUE: Multidetector CT imaging of the chest was performed following the standard protocol without IV contrast.  COMPARISON:  Chest CT 12/17/2011.  FINDINGS: Mediastinum: Right internal jugular double-lumen porta cath with tip terminating in the right atrium. Previously noted infiltrative nodal mass in the anterior mediastinum has  significantly decreased compared to the prior examination, although there continues to be a large amount of abnormal nodal tissue in the anterior mediastinum, best appreciated on image 28 of series 2, where it measures approximately 1.8 x 5.6 cm. There appears to be some persistent supraclavicular lymphadenopathy bilaterally, however, this is poorly evaluated on today's non contrast CT examination. Heart size is normal. There is no significant pericardial fluid, thickening or pericardial calcification. Moderate hiatal hernia.  Lungs/Pleura: Large area of airspace consolidation with air bronchograms in the right lower lobe, compatible pneumonia. There are other patchy areas of thickening of the peribronchovascular interstitium with some peribronchovascular ground-glass attenuation noted in the lungs bilaterally, most evident in the left lower and left upper lobes, presumably endobronchial spread of infection. In addition, there is an ovoid shaped 3.7 x 1.3 cm mass like area of consolidation in the left upper lobe, best appreciated on images 25 of series 2 and 5, with some surrounding ground-glass attenuation airspace disease, best appreciated on images 22-28 of series 5. Some interlobular septal thickening is also noted in the lungs bilaterally. Small left and moderate right pleural effusions layering dependently.  Upper Abdomen: Unremarkable.  Musculoskeletal: There are no aggressive appearing lytic or blastic lesions noted in the visualized portions of the skeleton.  IMPRESSION: 1. The appearance the chest is compatible with multilobar pneumonia. There is a lobar consolidation in the right lower lobe (with some sparing of the superior segment), in addition to patchy areas of apparent bronchopneumonia, most evident in the left lung, presumably secondary to endobronchial spread of disease. Much of these findings could be explicable by typical bacterial organisms. However, there is one area in the left upper lobe  where there is a somewhat mass-like region of airspace consolidation with surrounding ground-glass attenuation. Given the patient's immunosuppressed status, the possibility of an atypical organism such as angioinvasive aspergillosis warrants consideration, and clinical correlation is suggested. 2. Moderate right and small left pleural effusions layering dependently. 3. There has been some regression of anterior mediastinal adenopathy compared to the prior study, as above. There continues to be some supraclavicular lymphadenopathy, most notably on the right side, poorly evaluated on today's non contrast CT examination. 4. Additional findings, as above.   Electronically Signed   By: Vinnie Langton M.D.   On: 05/21/2014 16:13   Dg Chest Port 1 View  05/21/2014   CLINICAL DATA:  Fever and pleural effusions. Hypertension. Leukemia.  EXAM: PORTABLE CHEST - 1 VIEW  COMPARISON:  Single view of the chest 05/20/2014 and 05/19/2014.  FINDINGS: Right effusion and basilar airspace disease compatible with pneumonia persist and appear increased since the 05/19/2014 exam. There is also left basilar  airspace disease which appears increased since yesterday's examination. No pneumothorax is identified. There is cardiomegaly.  IMPRESSION: Increased left basilar airspace disease worrisome for pneumonia.  Right effusion basilar airspace disease compatible with pneumonia appear increased since the exam 2 days ago.   Electronically Signed   By: Inge Rise M.D.   On: 05/21/2014 08:31   Dg Chest Port 1 View  05/20/2014   CLINICAL DATA:  Fever, Port-A-Cath  EXAM: PORTABLE CHEST - 1 VIEW  COMPARISON:  05/19/2014  FINDINGS: Cardiomegaly again noted. Stable right Port-A-Cath position. Persistent small right pleural effusion with right lower lobe atelectasis or infiltrate. No pulmonary edema.  IMPRESSION: Stable right Port-A-Cath position. Persistent small right pleural effusion with right lower lobe atelectasis or infiltrate. No  pulmonary edema.   Electronically Signed   By: Lahoma Crocker M.D.   On: 05/20/2014 10:56       ASSESSMENT / PLAN:  PULMONARY A:  RLL HCAP with associated Community related rhinovirus CT chest with multilobar pna. Rt > lt effusions. Left Upper lobe as consolidation ? Atypical infection.   P:   Oxygen to keep SpO2 > 92% Consider FOB in future per MD  CARDIOVASCULAR Rt chest port  A:  Septic shock (s/p hydrocort and stopped due to cortisol 148)  05/21/14  -on and off pressors briefily since 05/20/14 AM. BNP and trop concerning for systolic CHF  P:  Pressors to keep SBP > 90 Continue IV fluids; give 500cc fluid bolus Defer CVC placement, reconsider to manage volume administration if she remains on pressors.   RENAL A:   Hypokalemia Hypomagnesemia Hypophosphatemia  P:   F/u BMET, UOP Replace K+, mag and phos  GASTROINTESTINAL A:   C diff colitis    - improved diarrhea 05/22/14 significant   P:   Advance diet as tolerated Rx C diff - call ID 05/21/14 to optimize management  HEMATOLOGIC A:   Background  - Acute lymphoblastic leukemia, followed at [facility] induced pancytopenia  05/21/14 - ongoing pancytopenia with coagulopathy, sp PRBC 05/20/14. WC rising but platelet unchagned    P:  Anemia: PRBC for hgb < 8gm% in view of NSTEMI Thrombocytopenia: platelet for count < 10K or if bleeding Leukpenia: ? Needs neupogen - but wc rising and ? Ok to give in leukemia setting (might need to check with oncology at [facility] 05/22/14) SCD's for DVT prophylaxis  INFECTIOUS Urine cx 11/25>>neg Blood cx 11/21 >> Urine cx 11/21 >> negative Respiratory viral panel 11/22 >> ++rhinovirus C diff 11/22 >> positive Sputum 11/22 >>nl flora   A: Septic shock  Neutropenic fever RLL HCAP Community Acquired Rhinovirus C diff colitis   - on 05/21/24 - still with ongoijng fevers despite antimicrobial for above. Ongoing diarrhea and CXR concerning for loculated  right effusion, CT with multilobar pna  P:   Vanco 11/21 >>  Cefepime 11/21 >>  Flagyl PO 11/22 >> 11/26 Flagyl IV 11/26>> Vanco PO 11/23 >>  Fluconazole 11/23 (prophylaxis) >>  acyclovir 11/23 (prophylaxis) >>   Day 6 empiric abx for neutropenic fever / sepsis.   ID now managing abx CT chest 11/26 cw multilobar pna   ENDOCRINE A:  Relative adrenal insufficiency. P:   Stopped  Solu-cortef as random cortisol 148  NEUROLOGIC A:   Acute encephalopathy 2nd to septic shock, resolved 11/24 P:   Monitor  FAMILY  - Updates: updated patient alone 11/24 and 05/21/14/ D/w Dr. Megan Salon at [place] family meet or Palliative Care meeting  due by 05/23/14:    TODAY'S SUMMARY:  Remains febrile but decrease in stools and reports feeling better. Consider transfer to [facility] where she gets her care now that she is stabilized.  [name] Minor ACNP Maryanna Shape PCCM Pager [phone] till 3 pm If no answer page [phone] 05/22/2014, 10:05 AM  Attending:  I have seen and examined the patient with nurse practitioner/resident and agree with the note above.   On exam today she is weak, but was up in the chair for several hours this morning Has been off of pressors > 24 hours now Still needs supp O2  ID changed to voriconazole today and added serum galactomannin.    I have discussed the case with Dr. Florene Glen at [facility] who has agreed to take the patient on transfer  Roselie Awkward, MD [place] PCCM Pager: [phone] Cell: [phone] If no response, call [phone]

## 2014-05-23 LAB — CULTURE, BLOOD (ROUTINE X 2)
CULTURE: NO GROWTH
Culture: NO GROWTH

## 2014-05-27 LAB — ASPERGILLUS GALACTOMANNAN ANTIGEN
Aspergillus galactomannan Ag: NOT DETECTED
Aspergillus galactomannan Index: 0.08 (ref <0.50)

## 2014-05-28 LAB — CULTURE, BLOOD (ROUTINE X 2)
Culture: NO GROWTH
Culture: NO GROWTH

## 2014-07-09 ENCOUNTER — Emergency Department (HOSPITAL_COMMUNITY): Payer: Medicare Other

## 2014-07-09 ENCOUNTER — Inpatient Hospital Stay (HOSPITAL_COMMUNITY)
Admission: EM | Admit: 2014-07-09 | Discharge: 2014-07-13 | DRG: 193 | Disposition: A | Discharge status: Hospice | Payer: Medicare Other | Attending: Internal Medicine | Admitting: Internal Medicine

## 2014-07-09 ENCOUNTER — Inpatient Hospital Stay (HOSPITAL_COMMUNITY): Payer: Medicare Other

## 2014-07-09 ENCOUNTER — Encounter (HOSPITAL_COMMUNITY): Payer: Self-pay

## 2014-07-09 DIAGNOSIS — A047 Enterocolitis due to Clostridium difficile: Secondary | ICD-10-CM | POA: Diagnosis present

## 2014-07-09 DIAGNOSIS — J189 Pneumonia, unspecified organism: Secondary | ICD-10-CM | POA: Diagnosis present

## 2014-07-09 DIAGNOSIS — A0472 Enterocolitis due to Clostridium difficile, not specified as recurrent: Secondary | ICD-10-CM | POA: Diagnosis present

## 2014-07-09 DIAGNOSIS — D61810 Antineoplastic chemotherapy induced pancytopenia: Secondary | ICD-10-CM | POA: Diagnosis present

## 2014-07-09 DIAGNOSIS — D849 Immunodeficiency, unspecified: Secondary | ICD-10-CM

## 2014-07-09 DIAGNOSIS — K759 Inflammatory liver disease, unspecified: Secondary | ICD-10-CM

## 2014-07-09 DIAGNOSIS — K5900 Constipation, unspecified: Secondary | ICD-10-CM | POA: Diagnosis present

## 2014-07-09 DIAGNOSIS — R748 Abnormal levels of other serum enzymes: Secondary | ICD-10-CM | POA: Diagnosis present

## 2014-07-09 DIAGNOSIS — Z79899 Other long term (current) drug therapy: Secondary | ICD-10-CM | POA: Diagnosis not present

## 2014-07-09 DIAGNOSIS — D65 Disseminated intravascular coagulation [defibrination syndrome]: Secondary | ICD-10-CM | POA: Diagnosis present

## 2014-07-09 DIAGNOSIS — F1290 Cannabis use, unspecified, uncomplicated: Secondary | ICD-10-CM | POA: Diagnosis present

## 2014-07-09 DIAGNOSIS — R1110 Vomiting, unspecified: Secondary | ICD-10-CM

## 2014-07-09 DIAGNOSIS — Z792 Long term (current) use of antibiotics: Secondary | ICD-10-CM | POA: Diagnosis not present

## 2014-07-09 DIAGNOSIS — R627 Adult failure to thrive: Secondary | ICD-10-CM | POA: Diagnosis present

## 2014-07-09 DIAGNOSIS — Z8701 Personal history of pneumonia (recurrent): Secondary | ICD-10-CM | POA: Diagnosis not present

## 2014-07-09 DIAGNOSIS — T889XXA Complication of surgical and medical care, unspecified, initial encounter: Secondary | ICD-10-CM | POA: Diagnosis present

## 2014-07-09 DIAGNOSIS — Y95 Nosocomial condition: Secondary | ICD-10-CM | POA: Diagnosis present

## 2014-07-09 DIAGNOSIS — Z515 Encounter for palliative care: Secondary | ICD-10-CM | POA: Diagnosis not present

## 2014-07-09 DIAGNOSIS — C9102 Acute lymphoblastic leukemia, in relapse: Secondary | ICD-10-CM | POA: Diagnosis present

## 2014-07-09 DIAGNOSIS — F419 Anxiety disorder, unspecified: Secondary | ICD-10-CM | POA: Diagnosis present

## 2014-07-09 DIAGNOSIS — I5023 Acute on chronic systolic (congestive) heart failure: Secondary | ICD-10-CM | POA: Diagnosis present

## 2014-07-09 DIAGNOSIS — T451X5A Adverse effect of antineoplastic and immunosuppressive drugs, initial encounter: Secondary | ICD-10-CM | POA: Diagnosis present

## 2014-07-09 DIAGNOSIS — I313 Pericardial effusion (noninflammatory): Secondary | ICD-10-CM | POA: Diagnosis present

## 2014-07-09 DIAGNOSIS — I255 Ischemic cardiomyopathy: Secondary | ICD-10-CM | POA: Diagnosis present

## 2014-07-09 DIAGNOSIS — Z87440 Personal history of urinary (tract) infections: Secondary | ICD-10-CM

## 2014-07-09 DIAGNOSIS — K7200 Acute and subacute hepatic failure without coma: Secondary | ICD-10-CM | POA: Diagnosis present

## 2014-07-09 DIAGNOSIS — J918 Pleural effusion in other conditions classified elsewhere: Secondary | ICD-10-CM

## 2014-07-09 DIAGNOSIS — J948 Other specified pleural conditions: Secondary | ICD-10-CM

## 2014-07-09 DIAGNOSIS — R112 Nausea with vomiting, unspecified: Secondary | ICD-10-CM | POA: Diagnosis present

## 2014-07-09 DIAGNOSIS — D899 Disorder involving the immune mechanism, unspecified: Secondary | ICD-10-CM

## 2014-07-09 DIAGNOSIS — Z87442 Personal history of urinary calculi: Secondary | ICD-10-CM | POA: Diagnosis not present

## 2014-07-09 DIAGNOSIS — R05 Cough: Secondary | ICD-10-CM | POA: Diagnosis not present

## 2014-07-09 DIAGNOSIS — R740 Nonspecific elevation of levels of transaminase and lactic acid dehydrogenase [LDH]: Secondary | ICD-10-CM

## 2014-07-09 DIAGNOSIS — R109 Unspecified abdominal pain: Secondary | ICD-10-CM | POA: Insufficient documentation

## 2014-07-09 DIAGNOSIS — Z66 Do not resuscitate: Secondary | ICD-10-CM | POA: Diagnosis present

## 2014-07-09 DIAGNOSIS — J90 Pleural effusion, not elsewhere classified: Secondary | ICD-10-CM | POA: Diagnosis present

## 2014-07-09 DIAGNOSIS — R059 Cough, unspecified: Secondary | ICD-10-CM

## 2014-07-09 DIAGNOSIS — R52 Pain, unspecified: Secondary | ICD-10-CM

## 2014-07-09 DIAGNOSIS — R0600 Dyspnea, unspecified: Secondary | ICD-10-CM

## 2014-07-09 DIAGNOSIS — T783XXA Angioneurotic edema, initial encounter: Secondary | ICD-10-CM | POA: Diagnosis present

## 2014-07-09 HISTORY — DX: Acute and subacute hepatic failure without coma: K72.00

## 2014-07-09 LAB — URINALYSIS, ROUTINE W REFLEX MICROSCOPIC
Bilirubin Urine: NEGATIVE
GLUCOSE, UA: NEGATIVE mg/dL
Ketones, ur: NEGATIVE mg/dL
LEUKOCYTES UA: NEGATIVE
Nitrite: NEGATIVE
PH: 5 (ref 5.0–8.0)
PROTEIN: 30 mg/dL — AB
SPECIFIC GRAVITY, URINE: 1.01 (ref 1.005–1.030)
Urobilinogen, UA: 0.2 mg/dL (ref 0.0–1.0)

## 2014-07-09 LAB — HEPATIC FUNCTION PANEL
ALK PHOS: 141 U/L — AB (ref 39–117)
ALT: 451 U/L — AB (ref 0–35)
AST: 599 U/L — ABNORMAL HIGH (ref 0–37)
Albumin: 2.3 g/dL — ABNORMAL LOW (ref 3.5–5.2)
BILIRUBIN TOTAL: 1.9 mg/dL — AB (ref 0.3–1.2)
Bilirubin, Direct: 0.9 mg/dL — ABNORMAL HIGH (ref 0.0–0.3)
Indirect Bilirubin: 1 mg/dL — ABNORMAL HIGH (ref 0.3–0.9)
TOTAL PROTEIN: 4.7 g/dL — AB (ref 6.0–8.3)

## 2014-07-09 LAB — CBC WITH DIFFERENTIAL/PLATELET
BASOS ABS: 0 10*3/uL (ref 0.0–0.1)
Basophils Relative: 0 % (ref 0–1)
EOS PCT: 0 % (ref 0–5)
Eosinophils Absolute: 0 10*3/uL (ref 0.0–0.7)
HCT: 31.7 % — ABNORMAL LOW (ref 36.0–46.0)
Hemoglobin: 10.3 g/dL — ABNORMAL LOW (ref 12.0–15.0)
Lymphocytes Relative: 17 % (ref 12–46)
Lymphs Abs: 1.1 10*3/uL (ref 0.7–4.0)
MCH: 31.9 pg (ref 26.0–34.0)
MCHC: 32.5 g/dL (ref 30.0–36.0)
MCV: 98.1 fL (ref 78.0–100.0)
MONOS PCT: 7 % (ref 3–12)
Monocytes Absolute: 0.4 10*3/uL (ref 0.1–1.0)
NEUTROS ABS: 4.9 10*3/uL (ref 1.7–7.7)
NRBC: 12 /100{WBCs} — AB
Neutrophils Relative %: 76 % (ref 43–77)
Platelets: 29 10*3/uL — CL (ref 150–400)
RBC: 3.23 MIL/uL — ABNORMAL LOW (ref 3.87–5.11)
RDW: 29.6 % — AB (ref 11.5–15.5)
WBC: 6.4 10*3/uL (ref 4.0–10.5)

## 2014-07-09 LAB — URINE MICROSCOPIC-ADD ON

## 2014-07-09 LAB — BASIC METABOLIC PANEL
Anion gap: 10 (ref 5–15)
BUN: 33 mg/dL — ABNORMAL HIGH (ref 6–23)
CALCIUM: 7.4 mg/dL — AB (ref 8.4–10.5)
CO2: 19 mmol/L (ref 19–32)
Chloride: 107 mEq/L (ref 96–112)
Creatinine, Ser: 1.13 mg/dL — ABNORMAL HIGH (ref 0.50–1.10)
GFR calc Af Amer: 76 mL/min — ABNORMAL LOW (ref >90)
GFR, EST NON AFRICAN AMERICAN: 65 mL/min — AB (ref >90)
GLUCOSE: 76 mg/dL (ref 70–99)
POTASSIUM: 3.8 mmol/L (ref 3.5–5.1)
SODIUM: 136 mmol/L (ref 135–145)

## 2014-07-09 LAB — TROPONIN I: Troponin I: 0.14 ng/mL — ABNORMAL HIGH (ref <0.031)

## 2014-07-09 LAB — BRAIN NATRIURETIC PEPTIDE: B Natriuretic Peptide: 3774.4 pg/mL — ABNORMAL HIGH (ref 0.0–100.0)

## 2014-07-09 LAB — PROTIME-INR
INR: 2.49 — AB (ref 0.00–1.49)
PROTHROMBIN TIME: 27.2 s — AB (ref 11.6–15.2)

## 2014-07-09 MED ORDER — IOHEXOL 300 MG/ML  SOLN
80.0000 mL | Freq: Once | INTRAMUSCULAR | Status: AC | PRN
  Administered 2014-07-09: 80 mL via INTRAVENOUS

## 2014-07-09 MED ORDER — VANCOMYCIN HCL IN DEXTROSE 1-5 GM/200ML-% IV SOLN
1000.0000 mg | INTRAVENOUS | Status: DC
  Administered 2014-07-10: 1000 mg via INTRAVENOUS
  Filled 2014-07-09: qty 200

## 2014-07-09 MED ORDER — METOPROLOL TARTRATE 12.5 MG HALF TABLET
12.5000 mg | ORAL_TABLET | Freq: Two times a day (BID) | ORAL | Status: DC
  Administered 2014-07-09: 12.5 mg via ORAL
  Filled 2014-07-09: qty 1

## 2014-07-09 MED ORDER — PIPERACILLIN-TAZOBACTAM 3.375 G IVPB
3.3750 g | Freq: Three times a day (TID) | INTRAVENOUS | Status: DC
  Administered 2014-07-10 – 2014-07-11 (×5): 3.375 g via INTRAVENOUS
  Filled 2014-07-09 (×5): qty 50

## 2014-07-09 MED ORDER — FAMOTIDINE IN NACL 20-0.9 MG/50ML-% IV SOLN
20.0000 mg | Freq: Two times a day (BID) | INTRAVENOUS | Status: DC
  Administered 2014-07-09 – 2014-07-13 (×8): 20 mg via INTRAVENOUS
  Filled 2014-07-09 (×8): qty 50

## 2014-07-09 MED ORDER — FENTANYL CITRATE 0.05 MG/ML IJ SOLN
12.5000 ug | INTRAMUSCULAR | Status: DC | PRN
  Administered 2014-07-09: 12.5 ug via INTRAVENOUS
  Filled 2014-07-09 (×2): qty 2

## 2014-07-09 MED ORDER — VANCOMYCIN 50 MG/ML ORAL SOLUTION
125.0000 mg | Freq: Four times a day (QID) | ORAL | Status: DC
  Administered 2014-07-10 – 2014-07-13 (×8): 125 mg via ORAL
  Filled 2014-07-09 (×21): qty 2.5

## 2014-07-09 MED ORDER — LIP MEDEX EX OINT
TOPICAL_OINTMENT | Freq: Once | CUTANEOUS | Status: AC
  Administered 2014-07-09: 20:00:00 via TOPICAL
  Filled 2014-07-09 (×2): qty 7

## 2014-07-09 MED ORDER — HEPARIN SODIUM (PORCINE) 5000 UNIT/ML IJ SOLN
5000.0000 [IU] | Freq: Three times a day (TID) | INTRAMUSCULAR | Status: DC
  Filled 2014-07-09 (×2): qty 1

## 2014-07-09 MED ORDER — ALBUMIN HUMAN 5 % IV SOLN
12.5000 g | Freq: Once | INTRAVENOUS | Status: DC
  Filled 2014-07-09: qty 250

## 2014-07-09 MED ORDER — OXYCODONE HCL 5 MG PO TABS: 5.0000 mg | ORAL_TABLET | Freq: Four times a day (QID) | ORAL | Status: DC | PRN

## 2014-07-09 MED ORDER — VANCOMYCIN HCL IN DEXTROSE 1-5 GM/200ML-% IV SOLN
1000.0000 mg | Freq: Once | INTRAVENOUS | Status: AC
  Administered 2014-07-09: 1000 mg via INTRAVENOUS
  Filled 2014-07-09: qty 200

## 2014-07-09 MED ORDER — MIRTAZAPINE 15 MG PO TABS
7.5000 mg | ORAL_TABLET | Freq: Every day | ORAL | Status: DC
  Filled 2014-07-09: qty 1

## 2014-07-09 MED ORDER — ONDANSETRON HCL 4 MG/2ML IJ SOLN
4.0000 mg | Freq: Four times a day (QID) | INTRAMUSCULAR | Status: DC | PRN
  Administered 2014-07-09: 4 mg via INTRAVENOUS
  Filled 2014-07-09: qty 2

## 2014-07-09 MED ORDER — ALBUMIN HUMAN 5 % IV SOLN
25.0000 g | Freq: Once | INTRAVENOUS | Status: AC
  Administered 2014-07-09: 25 g via INTRAVENOUS
  Filled 2014-07-09: qty 500

## 2014-07-09 MED ORDER — SODIUM CHLORIDE 0.9 % IV BOLUS (SEPSIS)
1000.0000 mL | Freq: Once | INTRAVENOUS | Status: AC
  Administered 2014-07-09: 1000 mL via INTRAVENOUS

## 2014-07-09 MED ORDER — VORICONAZOLE 50 MG PO TABS: 150.0000 mg | ORAL_TABLET | Freq: Two times a day (BID) | ORAL | Status: DC

## 2014-07-09 MED ORDER — PIPERACILLIN-TAZOBACTAM 3.375 G IVPB 30 MIN
3.3750 g | Freq: Once | INTRAVENOUS | Status: AC
  Administered 2014-07-09: 3.375 g via INTRAVENOUS
  Filled 2014-07-09: qty 50

## 2014-07-09 MED ORDER — METHYLPREDNISOLONE SODIUM SUCC 125 MG IJ SOLR
60.0000 mg | INTRAMUSCULAR | Status: DC
  Administered 2014-07-09 – 2014-07-12 (×4): 60 mg via INTRAVENOUS
  Filled 2014-07-09: qty 0.96
  Filled 2014-07-09 (×2): qty 2
  Filled 2014-07-09 (×2): qty 0.96

## 2014-07-09 NOTE — ED Notes (Signed)
Provided lotion to family and carmex for patient's lips

## 2014-07-09 NOTE — ED Notes (Addendum)
Per EMS, Pt, from home, c/o increasing abdominal distention/swelling and facial swelling x 4 days.  Denies pain.  Pt was recently seen and treated at [facility] for PNA.  Pt was discharged x 5 days ago.  Pt does not know, if they started her on any new medications.  [facility] providers were unable to treat the Leukemia, due to the PNA.  Hx of end stage Leukemia.  EMS sts that she Pt was referred to Hospice by Oncology, but they do not think that she understands her condition.  Pt's Oncologist informed the [facility] Department that the Pt was told to expect some swelling, due to renal problems.     Nurse at [facility] Oncologist office:  Kellie Simmering [phone].

## 2014-07-09 NOTE — ED Notes (Signed)
Pt upset advise to not draw any other labs at this time.

## 2014-07-09 NOTE — ED Notes (Signed)
Pt taken to CT. Will transport pt when she is back from CT.

## 2014-07-09 NOTE — ED Provider Notes (Signed)
CSN: [id number]     Arrival date & time 07/09/14  1448 History   First MD Initiated Contact with Patient 07/09/14 1507     Chief Complaint  Patient presents with  . Angioedema  . Abdominal Distention      (Consider location/radiation/quality/duration/timing/severity/associated sxs/prior Treatment) Patient is a 29 y.o. female presenting with general illness.  Illness Quality:  Malaise, fatigue, cough Severity:  Severe Onset quality:  Gradual Duration:  24 hours Timing:  Constant Progression:  Worsening Chronicity:  Recurrent Context:  Discharged from [facility] hospital 5 days ago after hospitalization for pneumonia.  She reports her chemo treatments have been suspended due to pneumonia.   Relieved by:  Nothing Worsened by:  Nothing Associated symptoms: cough, fever (100.x), nausea and shortness of breath   Associated symptoms: no abdominal pain, no chest pain and no vomiting   Associated symptoms comment:  Abdominal distension, malaise, facial swelling   Past Medical History  Diagnosis Date  . Urinary tract infection   . Anxiety 2007    SHORT COURSE OF MEDS  . Anemia     TAKING FE  . Infection     UIT X 1  . Infection     TRICH X 1  . Infection     YEAST X1  . 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  . Leukemia   . Ovarian cyst 12-26-2013    Right side  . Pneumonia   . AML (acute myelogenous leukemia)   . Nephrolithiasis 05/21/2014  . Leukemia    Past Surgical History  Procedure Laterality Date  . Cholecystectomy      age 45  . Choley    . Cesarean section  11/20/2011    Procedure: CESAREAN SECTION;  Surgeon: Delice Lesch, MD;  Location: [place] [facility];  Service: Gynecology;  Laterality: N/A;  Primary Cesarean Section Delivery Baby Girl @ 2350  . Power port  2012   Family History  Problem Relation Age of Onset  . Anesthesia problems Neg Hx   . Alcohol abuse Mother   . Lupus Brother    History  Substance Use Topics  . Smoking status: Never  Smoker   . Smokeless tobacco: Never Used  . Alcohol Use: No   OB History    Gravida Para Term Preterm AB TAB SAB Ectopic Multiple Living   5 4 3 1 1  0 1 0 0 3     Review of Systems  Constitutional: Positive for fever (100.x).  Respiratory: Positive for cough and shortness of breath.   Cardiovascular: Negative for chest pain.  Gastrointestinal: Positive for nausea. Negative for vomiting and abdominal pain.  All other systems reviewed and are negative.     Allergies  Review of patient's allergies indicates no known allergies.  Home Medications   Prior to Admission medications   Medication Sig Start Date End Date Taking? Authorizing Provider  acyclovir (ZOVIRAX) 200 MG capsule Take 2 capsules (400 mg total) by mouth daily. 05/22/14   Grace Bushy Minor, NP  ceFEPIme 2 g in dextrose 5 % 50 mL Inject 2 g into the vein every 8 (eight) hours. 05/22/14   Grace Bushy Minor, NP  diphenhydrAMINE (BENADRYL) 50 MG/ML injection Inject 0.5 mLs (25 mg total) into the vein every 6 (six) hours as needed for allergies. 05/22/14   Grace Bushy Minor, NP  fentaNYL (SUBLIMAZE) 0.05 MG/ML injection Inject 0.5 mLs (25 mcg total) into the vein every hour as needed for severe pain.  05/22/14   Grace Bushy Minor, NP  HYDROcodone-acetaminophen (NORCO) 10-325 MG per tablet Take 1 tablet by mouth every 6 (six) hours as needed for severe pain. 05/22/14   Grace Bushy Minor, NP  magnesium oxide (MAGOX 400) 400 (241.3 MG) MG tablet Take 400 mg by mouth daily.  05/07/14   Historical Provider, MD  metroNIDAZOLE (FLAGYL) 5-0.79 MG/ML-% IVPB Inject 100 mLs (500 mg total) into the vein every 8 (eight) hours. 05/22/14   Grace Bushy Minor, NP  pantoprazole (PROTONIX) 40 MG tablet Take 40 mg by mouth daily.  04/30/14   Historical Provider, MD  phosphorus (K PHOS NEUTRAL) 155-852-130 MG tablet Take 2 tablets (500 mg total) by mouth 3 (three) times daily. 05/22/14   Grace Bushy Minor, NP  sodium chloride 0.9 % infusion Inject 100 mLs into the  vein continuous. 05/22/14   Grace Bushy Minor, NP  vancomycin (VANCOCIN) 50 mg/mL oral solution Take 10 mLs (500 mg total) by mouth every 6 (six) hours. 05/22/14   Grace Bushy Minor, NP  vancomycin 1,250 mg in sodium chloride 0.9 % 250 mL Inject 1,250 mg into the vein every 8 (eight) hours. 05/22/14   Grace Bushy Minor, NP  voriconazole 230 mg in sodium chloride 0.9 % 100 mL Inject 230 mg into the vein every 12 (twelve) hours. 05/22/14   Grace Bushy Minor, NP   BP 101/72 mmHg  Pulse 114  Temp(Src) 98.3 F (36.8 C) (Oral)  Resp 26  SpO2 100% Physical Exam  Constitutional: She is oriented to person, place, and time. She appears well-developed and well-nourished. No distress.  HENT:  Head: Normocephalic and atraumatic.  Mouth/Throat: Oropharynx is clear and moist.  Mild facial angioedema (eyelids, lips)  Eyes: Conjunctivae are normal. Pupils are equal, round, and reactive to light. No scleral icterus.  Neck: Neck supple.  Cardiovascular: Normal rate, regular rhythm, normal heart sounds and intact distal pulses.   No murmur heard. Pulmonary/Chest: Effort normal. No stridor. No respiratory distress. She has rales in the right middle field and the right lower field.  Abdominal: Soft. She exhibits distension. Bowel sounds are decreased. There is no tenderness.  Musculoskeletal: Normal range of motion.  Neurological: She is alert and oriented to person, place, and time.  Skin: Skin is warm and dry. No rash noted.  Psychiatric: She has a normal mood and affect. Her behavior is normal.  Nursing note and vitals reviewed.   ED Course  CRITICAL CARE Performed by: Cyd Silence DAVID Authorized by: Cyd Silence DAVID Total critical care time: 40 minutes Critical care time was exclusive of separately billable procedures and treating other patients. Critical care was necessary to treat or prevent imminent or life-threatening deterioration of the following conditions: circulatory failure and  sepsis. Critical care was time spent personally by me on the following activities: development of treatment plan with patient or surrogate, discussions with consultants, evaluation of patient's response to treatment, examination of patient, obtaining history from patient or surrogate, ordering and performing treatments and interventions, ordering and review of laboratory studies, ordering and review of radiographic studies, pulse oximetry, re-evaluation of patient's condition and review of old charts.   (including critical care time) Labs Review Labs Reviewed  CBC WITH DIFFERENTIAL - Abnormal; Notable for the following:    RBC 3.23 (*)    Hemoglobin 10.3 (*)    HCT 31.7 (*)    RDW 29.6 (*)    Platelets 29 (*)    nRBC 12 (*)    All other  components within normal limits  BASIC METABOLIC PANEL - Abnormal; Notable for the following:    BUN 33 (*)    Creatinine, Ser 1.13 (*)    Calcium 7.4 (*)    GFR calc non Af Amer 65 (*)    GFR calc Af Amer 76 (*)    All other components within normal limits  URINALYSIS, ROUTINE W REFLEX MICROSCOPIC - Abnormal; Notable for the following:    Color, Urine AMBER (*)    APPearance CLOUDY (*)    Hgb urine dipstick TRACE (*)    Protein, ur 30 (*)    All other components within normal limits  TROPONIN I - Abnormal; Notable for the following:    Troponin I 0.14 (*)    All other components within normal limits  BRAIN NATRIURETIC PEPTIDE - Abnormal; Notable for the following:    B Natriuretic Peptide 3774.4 (*)    All other components within normal limits  HEPATIC FUNCTION PANEL - Abnormal; Notable for the following:    Total Protein 4.7 (*)    Albumin 2.3 (*)    AST 599 (*)    ALT 451 (*)    Alkaline Phosphatase 141 (*)    Total Bilirubin 1.9 (*)    Bilirubin, Direct 0.9 (*)    Indirect Bilirubin 1.0 (*)    All other components within normal limits  URINE MICROSCOPIC-ADD ON - Abnormal; Notable for the following:    Squamous Epithelial / LPF FEW (*)     Bacteria, UA FEW (*)    All other components within normal limits  CULTURE, BLOOD (ROUTINE X 2)  CULTURE, BLOOD (ROUTINE X 2)  CULTURE, EXPECTORATED SPUTUM-ASSESSMENT  GRAM STAIN  MRSA PCR SCREENING  HIV ANTIBODY (ROUTINE TESTING)  LEGIONELLA ANTIGEN, URINE  STREP PNEUMONIAE URINARY ANTIGEN  ACETAMINOPHEN LEVEL  OSMOLALITY, URINE  OSMOLALITY  AMMONIA  LACTIC ACID, PLASMA  PROTIME-INR  TROPONIN I  TROPONIN I  TROPONIN I    Imaging Review  Dg Chest Port 1 View  07/09/2014   CLINICAL DATA:  Cough, abdominal distension, facial swelling  EXAM: PORTABLE CHEST - 1 VIEW  COMPARISON:  05/21/2014  FINDINGS: Cardiomegaly is noted. Right Port-A-Cath with tip in right atrium. No pulmonary edema. There is right lower lobe infiltrate/ consolidation highly suspicious for pneumonia.  IMPRESSION: Cardiomegaly. Stable right Port-A-Cath position. There is right lower lobe infiltrate/consolidation highly suspicious for pneumonia.   Electronically Signed   By: Lahoma Crocker M.D.   On: 07/09/2014 16:27   Above radiology studies independently viewed by me.      EKG Interpretation   Date/Time:  Thursday July 09 2014 14:54:43 EST Ventricular Rate:  123 PR Interval:  72 QRS Duration: 77 QT Interval:  316 QTC Calculation: 452 R Axis:   -80 Text Interpretation:  Sinus tachycardia LOW VOLTAGE No significant change  was found Confirmed by [facility]  MD, TREY ([id number]) on 07/09/2014 3:52:29 PM      MDM   Final diagnoses:  Cough  Tachycardia HCAP   29 yo female with hx of ALL presenting with malaise, fatigue, cough, and swelling all worsening over past 24 hours.  She was recently dc'd from [facility] for treatment of pneumonia.  I have bene unable to pull DC summary or other clinical documents through [place].  She is tachycardic, complaining of SOB, and has RLL rales.  Will treat empirically for HCAP with antibiotics and IV fluids.  Labs and CXR pending.   CXR concerning for pneumonia.  Also,  LFTs elevated  for unclear reasons.  Have consulted Dr. Posey Pronto ([facility]) for admission.    Arbie Cookey, MD 07/09/14 [phone]

## 2014-07-09 NOTE — ED Notes (Signed)
Patient reports trouble breathing, 02 sats 99% on RA, speaking in complete sentences.

## 2014-07-09 NOTE — ED Notes (Signed)
Called to give report, nurse will call back.

## 2014-07-09 NOTE — ED Notes (Signed)
Bed: RESA Expected date:  Expected time:  Means of arrival:  Comments: EMS- generalized swelling, end stage Leukemia

## 2014-07-09 NOTE — Progress Notes (Signed)
CSW attempted to meet with patient. However, physician was present at bedside speaking with patient. Per nurse, the patient appears to be in denial about the stage of her leukemia.  Per note, patient is from home and c/o increasing abdominal distention/swelling and facial swelling x 4 days.  Willette Brace [phone] ED CSW 07/09/2014 9:44 PM

## 2014-07-09 NOTE — H&P (Signed)
[facility] History and Physical  Patient: Carla Haas  WCB:[id number]  DOB: [birth year]  DOS: the patient was seen and examined on 07/09/2014 PCP: Default, Provider, MD  Chief Complaint: Nausea and vomiting abdominal distention  HPI: Carla Haas is a 29 y.o. 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 difficile colitis, nonsustained V. tach, MRSA activity anemia, rhinovirus pneumonia, chemotherapy induced pancytopenia . Patient presented with complaints of nausea vomiting abdominal distention and tiredness. Patient has a complicated recent course. She was diagnosed with relapsed ALL in November and has undergone chemotherapy, then she developed septic shock with neutropenic fever and healthcare associated pneumonia, after that she was transferred to [facility] for continuation of her care that she was found to have stress-induced cardiomyopathy and nonsustained V. tach. In the meantime she was also continued treated for Aspergillus pneumonia with voriconazole. She had Pseudomonas which was pansensitive in her empyema after VATS for which she was initially on Meronem and later on switched to ciprofloxacin. Ultimately her oncologist recommended for prognosis of her ALL, and recommended her hospice. After discussion patient was discharged home on oral Cipro, oral voriconazole, Bumex and Lopressor for above mentioned conditions. Based on infectious diseases note from 07/01/2014 her galactomanan levels were negative and thus they recommended to stop voriconazole.  she also during the hospital course developed right-sided facial swelling for which a CT scan was performed that showed soft tissue swelling. CT scan was  performed on 07/02/2014, she was started on Cipro 07/01/2014.  After discharge from the hospital the patient continues to have generalized weakness fatigue and tiredness and was mostly lying in the bed. Patient refused hospice consultation and wanted to get a second opinion. Her facial swelling remain the same and did not resolve. She also developed some abdominal pain with distention and swelling of her legs which was progressively worsening over last 2 days and therefore she was brought here for further workup. Patient denies any fever, chest pain, diarrhea, burning urination, focal deficit. She complains of constipation, occasional cough, chills. She mentions after discharge she is only taking antibiotics and DayQuil.  The patient is coming from home. And at her baseline independent for most of her ADL.  Review of Systems: as mentioned in the history of present illness.  A Comprehensive review of the other systems is negative.  Past Medical History  Diagnosis Date  . Urinary tract infection   . Anxiety 2007    SHORT COURSE OF MEDS  . Anemia     TAKING FE  . Infection     UIT X 1  . Infection     TRICH X 1  . Infection     YEAST X1  . 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  . Leukemia   . Ovarian cyst 12-26-2013    Right side  . Pneumonia   . AML (acute myelogenous leukemia)   . Nephrolithiasis 05/21/2014  . Leukemia    Past Surgical History  Procedure Laterality Date  . Cholecystectomy      age 68  . Choley    . Cesarean section  11/20/2011    Procedure: CESAREAN SECTION;  Surgeon: Delice Lesch, MD;  Location: [facility];  Service: Gynecology;  Laterality: N/A;  Primary Cesarean Section Delivery Baby Girl @  19  . Power port  2012   Social History:  reports that she has never smoked. She has never used smokeless tobacco. She reports that she uses illicit drugs (Marijuana). She reports that she does not drink alcohol.  No Known Allergies  Family  History  Problem Relation Age of Onset  . Anesthesia problems Neg Hx   . Alcohol abuse Mother   . Lupus Brother     Prior to Admission medications   Medication Sig Start Date End Date Taking? Authorizing Provider  ciprofloxacin (CIPRO) 500 MG tablet Take 500 mg by mouth 2 (two) times daily.   Yes Historical Provider, MD  Vancomycin HCl (FIRST-VANCOMYCIN 25) 25 MG/ML SOLN Take 5 mLs by mouth every 6 (six) hours.   Yes Historical Provider, MD  voriconazole (VFEND) 50 MG tablet Take 150 mg by mouth 2 (two) times daily.   Yes Historical Provider, MD  bumetanide (BUMEX) 0.5 MG tablet Take 0.5 mg by mouth daily as needed (swelling).    Historical Provider, MD  calcium-vitamin D (OSCAL WITH D) 500-200 MG-UNIT per tablet Take 1 tablet by mouth 3 (three) times daily.    Historical Provider, MD  docusate sodium (COLACE) 100 MG capsule Take 100 mg by mouth 2 (two) times daily.    Historical Provider, MD  dronabinol (MARINOL) 2.5 MG capsule Take 2.5 mg by mouth 2 (two) times daily before a meal.    Historical Provider, MD  glucose 4 GM chewable tablet Chew 16 tablets by mouth as needed for low blood sugar.    Historical Provider, MD  magnesium oxide (MAGOX 400) 400 (241.3 MG) MG tablet Take 400 mg by mouth daily.  05/07/14   Historical Provider, MD  metoprolol tartrate (LOPRESSOR) 25 MG tablet Take 12.5 mg by mouth 2 (two) times daily.    Historical Provider, MD  mirtazapine (REMERON) 7.5 MG tablet Take 7.5 mg by mouth at bedtime.    Historical Provider, MD  polyvinyl alcohol (LIQUIFILM TEARS) 1.4 % ophthalmic solution Place 1 drop into both eyes daily.    Historical Provider, MD  prochlorperazine (COMPAZINE) 10 MG tablet Take 10 mg by mouth every 6 (six) hours as needed for nausea or vomiting.    Historical Provider, MD  zolpidem (AMBIEN) 5 MG tablet Take 5 mg by mouth at bedtime as needed for sleep.    Historical Provider, MD    Physical Exam: Filed Vitals:   07/09/14 1938 07/09/14 2000 07/09/14  2100 07/09/14 2142  BP: 103/78 101/78  103/78  Pulse: 122 122 122 127  Temp:      TempSrc:      Resp: 18 29 21 20   Height:      Weight:      SpO2: 99% 97% 96% 100%    General: Alert, Awake and Oriented to Time, Place and Person. Appear in moderate distress Eyes: PERRL, right eyelid swollen ENT: Oral Mucosa clear moist. Neck: mild JVD Cardiovascular: S1 and S2 Present, no Murmur, Peripheral Pulses Present Respiratory: Bilateral Air entry equal and Decreased, bilateral basal Crackles, no wheezes Abdomen: Bowel Sound present soft and non tender Skin: no Rash Extremities: Trace pedal edema, non calf tenderness Neurologic: Grossly no focal neuro deficit.  Labs on Admission:  CBC:  Recent Labs Lab 07/09/14 1558  WBC 6.4  NEUTROABS 4.9  HGB 10.3*  HCT 31.7*  MCV 98.1  PLT 29*    CMP     Component Value Date/Time   NA 136 07/09/2014 1558   K 3.8 07/09/2014  1558   CL 107 07/09/2014 1558   CO2 19 07/09/2014 1558   GLUCOSE 76 07/09/2014 1558   BUN 33* 07/09/2014 1558   CREATININE 1.13* 07/09/2014 1558   CALCIUM 7.4* 07/09/2014 1558   PROT 4.7* 07/09/2014 1558   ALBUMIN 2.3* 07/09/2014 1558   AST 599* 07/09/2014 1558   ALT 451* 07/09/2014 1558   ALKPHOS 141* 07/09/2014 1558   BILITOT 1.9* 07/09/2014 1558   GFRNONAA 65* 07/09/2014 1558   GFRAA 76* 07/09/2014 1558    No results for input(s): LIPASE, AMYLASE in the last 168 hours. No results for input(s): AMMONIA in the last 168 hours.   Recent Labs Lab 07/09/14 1558  TROPONINI 0.14*   BNP (last 3 results)  Recent Labs  05/21/14 0500  PROBNP 14679.0*    Radiological Exams on Admission: Ct Abdomen Pelvis W Contrast  07/09/2014   CLINICAL DATA:  Progressive abdominal distention for 4 days. History of leukemia.  EXAM: CT ABDOMEN AND PELVIS WITH CONTRAST  TECHNIQUE: Multidetector CT imaging of the abdomen and pelvis was performed using the standard protocol following bolus administration of intravenous  contrast.  CONTRAST:  60mL OMNIPAQUE IOHEXOL 300 MG/ML  SOLN  COMPARISON:  CT 05/16/2014  FINDINGS: Development of moderate pericardial effusion measuring 2-3 cm. Development of small bilateral pleural effusions. Right lower and middle lobe airspace disease with air bronchograms.  There is a moderate volume ascites in the upper abdomen. Moderate to large volume of ascites in the pelvis.  There is diffuse thickening and increased enhancement of the proximal small bowel loops. Diminished enhancement of distal small bowel loops. Small amount of stool is seen throughout the colon. Detailed bowel evaluation is limited given lack of oral contrast.  The liver appears enlarged and is diffusely heterogeneous and nodular in enhancement. Clips in the gallbladder fossa from cholecystectomy. The spleen is normal in size. There is no focal pancreatic abnormality. There is heterogeneous enhancement of both kidneys. There is no adrenal nodule. The adrenal glands appear are hyperenhancing. There is diffuse subcutaneous soft tissue edema.  Bladder is well distended. Uterus is grossly unremarkable. The abdominal aorta is normal in caliber.  There is no focal osseous abnormality.  IMPRESSION: 1. Moderate to large volume of intra-abdominal/pelvic ascites. Moderate pericardial effusion, bilateral pleural effusions. Diffuse soft tissue edema. 2. There are findings suggesting altered for fusion and concerning for circulatory shock. This includes abnormal appearance of small bowel, hyper enhancement of the adrenal glands, and heterogeneous perfusion of the liver. 3. Right lower and middle lobe consolidations with air bronchograms concerning for pneumonia.   Electronically Signed   By: Jeb Levering M.D.   On: 07/09/2014 23:19   Dg Chest Port 1 View  07/09/2014   CLINICAL DATA:  Cough, abdominal distension, facial swelling  EXAM: PORTABLE CHEST - 1 VIEW  COMPARISON:  05/21/2014  FINDINGS: Cardiomegaly is noted. Right Port-A-Cath with  tip in right atrium. No pulmonary edema. There is right lower lobe infiltrate/ consolidation highly suspicious for pneumonia.  IMPRESSION: Cardiomegaly. Stable right Port-A-Cath position. There is right lower lobe infiltrate/consolidation highly suspicious for pneumonia.   Electronically Signed   By: Lahoma Crocker M.D.   On: 07/09/2014 16:27    EKG: Independently reviewed. sinus tachycardia.  Assessment/Plan Principal Problem:   HCAP (healthcare-associated pneumonia) Active Problems:   Intractable nausea and vomiting   Antineoplastic chemotherapy induced pancytopenia   Parapneumonic effusion   C. difficile colitis   Abnormal transaminases   DIC (disseminated intravascular coagulation)   Shock liver  1. HCAP (healthcare-associated pneumonia)  Pseudomonas empyema Sepsis DIC  The patient is presenting with complaints of nausea vomiting generalized fatigue. She is on Cipro for Pseudomonas empyema. Her chest x-ray has been read as pneumonia but she may have continue radiological findings of her recently treated pneumonia.  Patient was on ciprofloxacin after which she developed right-sided facial swelling and therefore I would stop the Ciprofloxacin. With her history of immunosuppression currently she will be treated broadly with vancomycin and Zosyn.  As per infectious diseases note from [facility], recommendation would be 4 weeks intravenous of antibiotics, and since her Pseudomonas was pansensitive she will be treated with Zosyn.  2. DIC, shock liver, ALL, ischemic cardiomyopathy Pericardial effusion, cardiomyopathy, acute on chronic systolic heart failure. Elevated troponin.  Patient's fibrinogen level is 134, INR is 2.49, APTT is elevated. She has chronic thrombocytopenia. With this she meets the criteria for DIC possible etiology is a LL as well as pneumonia/sepsis. Her LFT are elevated with normal ammonia level and CT scan of the abdomen shows evidence of circulatory shock  suggesting she has shock liver. She also has developed ascites and pericardial and pleural effusion. I discuss in detail about patient's poor prognosis due to her a LL and subsequent DIC and pneumonia and presence of patient's family as well as RN and  Initially patient preferred to remain full code and wanted to do aggressive therapy. Critical care and cardiology consulted for the patient. Initial plan was to obtain central line to assess her filling pressures after transfusing platelets and FFP for coagulopathy.  Cardiology evaluated the patient at bedside with a bedside echo showing significantly diminished LV function. With the new information from the cardiology patient and her family members understood patient's poor prognosis and preferred to remain comfortable without any aggressive therapy, preferred not to be resuscitated or intubated and discuss further goals of care with hospice in morning.   3. C. difficile colitis Continue oral vancomycin  4. ALL Based on assessment done at [facility] by patient's primary oncologist, and current multiorgan failure patient likely not a good candidate for further aggressive treatment for ALL. Appreciate cardiology input, and discussed patient's prognosis and goals of care. Palliative care and hospice consulted. Current plan is for patient to remain comfortable without any aggressive treatment. Orders changed accordingly. Continue with Solu-Medrol, and antibiotics at present.  Advance goals of care discussion: DNR/DNI, discussed in detail in the presence of family member patient's brothers and family friends.   Consults: Cardiology, critical care  DVT Prophylaxis: mechanical compression device Nutrition: Regular diet  Family Communication: Family was present at bedside, opportunity was given to ask question and all questions were answered satisfactorily at the time of interview. Disposition: Admitted to inpatient in step-down  unit.  Author: Berle Mull, MD [facility] Pager: [phone] 07/09/2014, 11:38 PM    If 7PM-7AM, please contact night-coverage [email or web address] Password [email or web address]

## 2014-07-09 NOTE — Progress Notes (Signed)
ANTIBIOTIC CONSULT NOTE - INITIAL  Pharmacy Consult for Vancomycin and Zosyn Indication: HCAP  No Known Allergies  Patient Measurements: Height: 4\' 11"  (149.9 cm) Weight: 100 lb (45.36 kg) IBW/kg (Calculated) : 43.2  Vital Signs: Temp: 98.3 F (36.8 C) (01/14 1453) Temp Source: Oral (01/14 1453) BP: 100/65 mmHg (01/14 1800) Pulse Rate: 113 (01/14 1800) Intake/Output from previous day:   Intake/Output from this shift:    Labs:  Recent Labs  07/09/14 1558  WBC 6.4  HGB 10.3*  PLT 29*  CREATININE 1.13*   Estimated Creatinine Clearance: 50.5 mL/min (by C-G formula based on Cr of 1.13). No results for input(s): VANCOTROUGH, VANCOPEAK, VANCORANDOM, GENTTROUGH, GENTPEAK, GENTRANDOM, TOBRATROUGH, TOBRAPEAK, TOBRARND, AMIKACINPEAK, AMIKACINTROU, AMIKACIN in the last 72 hours.   Microbiology: No results found for this or any previous visit (from the past 720 hour(s)).  Medical History: Past Medical History  Diagnosis Date  . Urinary tract infection   . Anxiety 2007    SHORT COURSE OF MEDS  . Anemia     TAKING FE  . Infection     UIT X 1  . Infection     TRICH X 1  . Infection     YEAST X1  . 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  . Leukemia   . Ovarian cyst 12-26-2013    Right side  . Pneumonia   . AML (acute myelogenous leukemia)   . Nephrolithiasis 05/21/2014  . Leukemia     Medications:  Anti-infectives    Start     Dose/Rate Route Frequency Ordered Stop   07/09/14 1700  vancomycin (VANCOCIN) IVPB 1000 mg/200 mL premix     1,000 mg200 mL/hr over 60 Minutes Intravenous  Once 07/09/14 1635     07/09/14 1700  piperacillin-tazobactam (ZOSYN) IVPB 3.375 g     3.375 g100 mL/hr over 30 Minutes Intravenous  Once 07/09/14 1635 07/09/14 1740     Assessment: 29yo F w/ end-stage leukemia presents w/ increasing abd distention/swelling and facial swelling x 4 days. Also fever, cough, SOB, and nausea. She was recently admitted to [facility] for tx of  pneumonia. Pharmacy is asked to start Vanc and Zosyn for suspected HCAP.  1/14 >> Zosyn >> 1/14 >> Vanc >>    Tmax: AF WBCs: wnl Renal: SCr 1.13, 51CG, 84N  Goal of Therapy:  Vancomycin trough level 15-20 mcg/ml  Appropriate antibiotic dosing for renal function; eradication of infection  Plan:  Vancomycin 1g IV q24h. Zosyn 3.375g IV Q8H infused over 4hrs. Measure Vanc trough at steady state. Follow up renal fxn and culture results.  Romeo Rabon, PharmD, pager [phone]. 07/09/2014,6:30 PM.

## 2014-07-10 ENCOUNTER — Encounter (HOSPITAL_COMMUNITY): Payer: Self-pay | Admitting: Internal Medicine

## 2014-07-10 DIAGNOSIS — R109 Unspecified abdominal pain: Secondary | ICD-10-CM | POA: Insufficient documentation

## 2014-07-10 DIAGNOSIS — R0600 Dyspnea, unspecified: Secondary | ICD-10-CM

## 2014-07-10 DIAGNOSIS — D65 Disseminated intravascular coagulation [defibrination syndrome]: Secondary | ICD-10-CM | POA: Diagnosis present

## 2014-07-10 DIAGNOSIS — Z66 Do not resuscitate: Secondary | ICD-10-CM

## 2014-07-10 DIAGNOSIS — K7200 Acute and subacute hepatic failure without coma: Secondary | ICD-10-CM

## 2014-07-10 DIAGNOSIS — R1084 Generalized abdominal pain: Secondary | ICD-10-CM

## 2014-07-10 DIAGNOSIS — I429 Cardiomyopathy, unspecified: Secondary | ICD-10-CM

## 2014-07-10 DIAGNOSIS — R52 Pain, unspecified: Secondary | ICD-10-CM

## 2014-07-10 HISTORY — DX: Acute and subacute hepatic failure without coma: K72.00

## 2014-07-10 LAB — AMMONIA: Ammonia: 25 umol/L (ref 11–32)

## 2014-07-10 LAB — MRSA PCR SCREENING: MRSA BY PCR: NEGATIVE

## 2014-07-10 LAB — LACTIC ACID, PLASMA: Lactic Acid, Venous: 6.9 mmol/L — ABNORMAL HIGH (ref 0.5–2.2)

## 2014-07-10 LAB — APTT: aPTT: 41 seconds — ABNORMAL HIGH (ref 24–37)

## 2014-07-10 LAB — FIBRINOGEN: FIBRINOGEN: 134 mg/dL — AB (ref 204–475)

## 2014-07-10 LAB — TROPONIN I: TROPONIN I: 0.19 ng/mL — AB (ref <0.031)

## 2014-07-10 MED ORDER — MORPHINE SULFATE 2 MG/ML IJ SOLN
1.0000 mg | INTRAMUSCULAR | Status: DC | PRN
  Administered 2014-07-10 – 2014-07-12 (×4): 1 mg via INTRAVENOUS
  Filled 2014-07-10 (×4): qty 1

## 2014-07-10 MED ORDER — ACETAMINOPHEN 325 MG PO TABS: 650.0000 mg | ORAL_TABLET | Freq: Once | ORAL | Status: DC

## 2014-07-10 MED ORDER — FENTANYL CITRATE 0.05 MG/ML IJ SOLN
12.5000 ug | Freq: Once | INTRAMUSCULAR | Status: AC
  Administered 2014-07-10: 12.5 ug via INTRAVENOUS
  Filled 2014-07-10: qty 2

## 2014-07-10 MED ORDER — SODIUM CHLORIDE 0.9 % IV SOLN: Freq: Once | INTRAVENOUS | Status: DC

## 2014-07-10 MED ORDER — LORAZEPAM 2 MG/ML IJ SOLN: 1.0000 mg | Freq: Four times a day (QID) | INTRAMUSCULAR | Status: DC | PRN

## 2014-07-10 MED ORDER — LORAZEPAM 0.5 MG PO TABS: 0.5000 mg | ORAL_TABLET | Freq: Two times a day (BID) | ORAL | Status: DC | PRN

## 2014-07-10 MED ORDER — DIPHENHYDRAMINE HCL 25 MG PO CAPS: 25.0000 mg | ORAL_CAPSULE | Freq: Four times a day (QID) | ORAL | Status: DC | PRN

## 2014-07-10 MED ORDER — FENTANYL CITRATE 0.05 MG/ML IJ SOLN: 12.5000 ug | INTRAMUSCULAR | Status: DC | PRN

## 2014-07-10 NOTE — Progress Notes (Signed)
Nutrition Brief Note  Chart reviewed. Pt now transitioning to comfort care.  No further nutrition interventions warranted at this time.  Please re-consult as needed.   Laurette Schimke MS, RD, LDN

## 2014-07-10 NOTE — Consult Note (Signed)
[place] Liaison: Received request from [place] for family interest in [facility]. Chart reviewed. Appreciate PMT note. Unfortunately no availability today. Will continue to follow, update CSW and family with changes. Will contact W/E CSW for W/E changes. Thank you. Erling Conte LCSW [phone]

## 2014-07-10 NOTE — Care Management Note (Signed)
    Page 1 of 1   07/10/2014     11:58:58 AM CARE MANAGEMENT NOTE 07/10/2014  Patient:  Carla Haas, Carla Haas   Account Number:  [id number]  Date Initiated:  07/10/2014  Documentation initiated by:  Dessa Phi  Subjective/Objective Assessment:   29 y/o f admitted w/HCAP.Hx:T-cell ALL.     Action/Plan:   From home.   Anticipated DC Date:  07/13/2014   Anticipated DC Plan:  [place]  CM consult      Choice offered to / List presented to:             Status of service:  In process, will continue to follow Medicare Important Message given?   (If response is "NO", the following Medicare IM given date fields will be blank) Date Medicare IM given:   Medicare IM given by:   Date Additional Medicare IM given:   Additional Medicare IM given by:    Discharge Disposition:    Per UR Regulation:  Reviewed for med. necessity/level of care/duration of stay  If discussed at [place] of Stay Meetings, dates discussed:    Comments:  07/10/14 Dessa Phi RN BSN NCM [place] care.CSW-residentail hospice.

## 2014-07-10 NOTE — Progress Notes (Signed)
[facility] PROGRESS NOTE  Carla Haas INO:[id number] DOB: 07-16-1985 DOA: 07/09/2014 PCP: Default, Provider, MD  Assessment/Plan: 1. Healthcare associated pneumonia in setting of malignancy -Patient recently treated for Pseudomonas empyema at [facility]. Prior to this hospitalization she had been on ciprofloxacin, changed to IV vancomycin and Zosyn during this hospitalization -Will continue empiric IV antimicrobial therapy until she is evaluated by palliative care.  2. Refractory T-cell ALL -Given presence of multiorgan failure, advanced disease, with her oncologist at [facility] recommending transition to hospice, not candidate for further aggressive therapies.  3.  Acute systolic congestive heart failure -Patient with history of cardiomyopathy, found to have ejection fraction of 10-15% -Cardiology recommending as needed IV Lasix for comfort  4.  C. difficile colitis -Continue oral vancomycin  5.  Intractable nausea and vomiting -Likely related to multiple factors including advanced malignancy, heart failure, active infection -Continue supportive care, as needed antiemetic therapy  6.  Goals of care -Patient with history of refractory T-cell ALL, having a recent prolonged hospitalization at [facility] -She presents with multiorgan failure, not candidate for antineoplastic therapy -Goals of care discussed with patient and family members, requesting transition of care to comfort rather pursuing further potential invasive/burdensome interventions that are likely not to change endpoint. -Palliative Care consulted   Code Status: DO NOT RESUSCITATE Family Communication: Spoke to family members present at bedside Disposition Plan: Will transfer patient to [place]. comfort care measures   Consultants:  Cardiology  Antibiotics:  Zosyn  Vancomycin  HPI/Subjective: Patient is an unfortunate 29 year old female with a past medical history of  refractory T-cell ALL, previously diagnosed 12/17/2011 with normal cytogenetics, initiated with pegasparagase per CALGB 09628, s/p cycle 2 of 36629 on 05/05/2014, was followed by Dr. Florene Glen of medical oncology at [facility], cardiomyopathy, admitted to the medicine service on 07/09/2014 presented with nausea, vomiting, abdominal distention. She was diagnosed with relapsed ALL in November and has undergone chemotherapy, then she developed septic shock with neutropenic fever and healthcare associated pneumonia, after that she was transferred to [facility] for continuation of her care that she was found to have stress-induced cardiomyopathy and nonsustained V. tach. In the meantime she was also continued treated for Aspergillus pneumonia with voriconazole. She had Pseudomonas which was pansensitive in her empyema after VATS for which she was initially on Meronem and later on switched to ciprofloxacin. Ultimately her oncologist recommended for prognosis of her ALL, and recommended her hospice. During this hospitalization she was found to have an ejection fraction of 10-15%. Goals of care discussed with patient and family members during this hospitalization as the goal of her care as been transitioned to comfort.    Objective: Filed Vitals:   07/10/14 0800  BP: 92/62  Pulse:   Temp:   Resp: 24    Intake/Output Summary (Last 24 hours) at 07/10/14 0906 Last data filed at 07/10/14 0349  Gross per 24 hour  Intake    100 ml  Output      2 ml  Net     98 ml   Filed Weights   07/09/14 1652 07/10/14 0500  Weight: 45.36 kg (100 lb) 53.4 kg (117 lb 11.6 oz)    Exam:   General:  Ill-appearing, sedated, no acute distress she was arousable  Cardiovascular: Tachycardic, regular rate and rhythm normal S1-S2  Respiratory: Diminished breath sounds, bibasilar crackles, positive rhonchi, no wheezing  Abdomen: Her abdomen is distended, did not appear to have pain to  palpation  Musculoskeletal:  No edema  Data Reviewed: Basic Metabolic Panel:  Recent Labs Lab 07/09/14 1558  NA 136  K 3.8  CL 107  CO2 19  GLUCOSE 76  BUN 33*  CREATININE 1.13*  CALCIUM 7.4*   Liver Function Tests:  Recent Labs Lab 07/09/14 1558  AST 599*  ALT 451*  ALKPHOS 141*  BILITOT 1.9*  PROT 4.7*  ALBUMIN 2.3*   No results for input(s): LIPASE, AMYLASE in the last 168 hours.  Recent Labs Lab 07/09/14 2340  AMMONIA 25   CBC:  Recent Labs Lab 07/09/14 1558  WBC 6.4  NEUTROABS 4.9  HGB 10.3*  HCT 31.7*  MCV 98.1  PLT 29*   Cardiac Enzymes:  Recent Labs Lab 07/09/14 1558 07/09/14 2340  TROPONINI 0.14* 0.19*   BNP (last 3 results)  Recent Labs  05/21/14 0500  PROBNP 14679.0*   CBG: No results for input(s): GLUCAP in the last 168 hours.  Recent Results (from the past 240 hour(s))  MRSA PCR Screening     Status: None   Collection Time: 07/09/14 11:20 PM  Result Value Ref Range Status   MRSA by PCR NEGATIVE NEGATIVE Final    Comment:        The GeneXpert MRSA Assay (FDA approved for NASAL specimens only), is one component of a comprehensive MRSA colonization surveillance program. It is not intended to diagnose MRSA infection nor to guide or monitor treatment for MRSA infections.      Studies: Ct Abdomen Pelvis W Contrast  07/09/2014   CLINICAL DATA:  Progressive abdominal distention for 4 days. History of leukemia.  EXAM: CT ABDOMEN AND PELVIS WITH CONTRAST  TECHNIQUE: Multidetector CT imaging of the abdomen and pelvis was performed using the standard protocol following bolus administration of intravenous contrast.  CONTRAST:  40mL OMNIPAQUE IOHEXOL 300 MG/ML  SOLN  COMPARISON:  CT 05/16/2014  FINDINGS: Development of moderate pericardial effusion measuring 2-3 cm. Development of small bilateral pleural effusions. Right lower and middle lobe airspace disease with air bronchograms.  There is a moderate volume ascites in the upper  abdomen. Moderate to large volume of ascites in the pelvis.  There is diffuse thickening and increased enhancement of the proximal small bowel loops. Diminished enhancement of distal small bowel loops. Small amount of stool is seen throughout the colon. Detailed bowel evaluation is limited given lack of oral contrast.  The liver appears enlarged and is diffusely heterogeneous and nodular in enhancement. Clips in the gallbladder fossa from cholecystectomy. The spleen is normal in size. There is no focal pancreatic abnormality. There is heterogeneous enhancement of both kidneys. There is no adrenal nodule. The adrenal glands appear are hyperenhancing. There is diffuse subcutaneous soft tissue edema.  Bladder is well distended. Uterus is grossly unremarkable. The abdominal aorta is normal in caliber.  There is no focal osseous abnormality.  IMPRESSION: 1. Moderate to large volume of intra-abdominal/pelvic ascites. Moderate pericardial effusion, bilateral pleural effusions. Diffuse soft tissue edema. 2. There are findings suggesting altered for fusion and concerning for circulatory shock. This includes abnormal appearance of small bowel, hyper enhancement of the adrenal glands, and heterogeneous perfusion of the liver. 3. Right lower and middle lobe consolidations with air bronchograms concerning for pneumonia.   Electronically Signed   By: Jeb Levering M.D.   On: 07/09/2014 23:19   Dg Chest Port 1 View  07/09/2014   CLINICAL DATA:  Cough, abdominal distension, facial swelling  EXAM: PORTABLE CHEST - 1 VIEW  COMPARISON:  05/21/2014  FINDINGS: Cardiomegaly  is noted. Right Port-A-Cath with tip in right atrium. No pulmonary edema. There is right lower lobe infiltrate/ consolidation highly suspicious for pneumonia.  IMPRESSION: Cardiomegaly. Stable right Port-A-Cath position. There is right lower lobe infiltrate/consolidation highly suspicious for pneumonia.   Electronically Signed   By: Lahoma Crocker M.D.   On:  07/09/2014 16:27    Scheduled Meds: . famotidine (PEPCID) IV  20 mg Intravenous Q12H  . methylPREDNISolone (SOLU-MEDROL) injection  60 mg Intravenous Q24H  . mirtazapine  7.5 mg Oral QHS  . piperacillin-tazobactam (ZOSYN)  IV  3.375 g Intravenous Q8H  . vancomycin  125 mg Oral 4 times per day  . vancomycin  1,000 mg Intravenous Q24H   Continuous Infusions:   Principal Problem:   HCAP (healthcare-associated pneumonia) Active Problems:   Intractable nausea and vomiting   Antineoplastic chemotherapy induced pancytopenia   Parapneumonic effusion   C. difficile colitis   Abnormal transaminases   DIC (disseminated intravascular coagulation)   Shock liver    Time spent: 35 min    Kelvin Cellar  [facility] Pager [phone]. If 7PM-7AM, please contact night-coverage at [email or web address], password [facility] 07/10/2014, 9:06 AM  LOS: 1 day

## 2014-07-10 NOTE — Progress Notes (Signed)
Patient requesting to "make it known" her wishes after her death.  Requested to have Luis Abed, and Jenny Reichmann present to make her wishes known.  Wishes written down, witnessed by this RN and Earma Reading, RN, signed by all present.  Will need social work to assist with matters involving the children.  Will continue to monitor.

## 2014-07-10 NOTE — Progress Notes (Signed)
Clinical Social Work Department BRIEF PSYCHOSOCIAL ASSESSMENT 07/10/2014  Patient:  Carla Haas, Carla Haas     Account Number:  [id number]     Admit date:  07/09/2014  Clinical Social Worker:  Maryln Manuel  Date/Time:  07/10/2014 12:00 N  Referred by:  Physician  Date Referred:  07/10/2014 Referred for  Residential hospice placement   Other Referral:   Interview type:  Family Other interview type:    PSYCHOSOCIAL DATA Living Status:  FAMILY Admitted from facility:   Level of care:   Primary support name:  Gerald Stabs [place]/[phone] Primary support relationship to patient:  SIBLING Degree of support available:   strong    CURRENT CONCERNS Current Concerns  Post-Acute Placement   Other Concerns:    SOCIAL WORK ASSESSMENT / PLAN CSW received referral for residential hospice placement.    CSW met with pt, pt brother, Gerald Stabs, and pt friend at bedside along with PMT NP, Wadie Lessen. CSW introduced self and explained role. PMT NP, Wadie Lessen had just had goals of care meeting with pt and pt family and pt and pt family agreeable to residential hospice. CSW discussed options and pt and pt family want [facility]. Pt family wishes for pt to be close to home as pt has children and it will be important for pt family to surround pt during end of life. Emotional support provided as pt brother in active grief, but pt brother expressed that pt and pt brother had a good last four days following pt discharging from [place] brother expressed his admirationfor pt and how strong pt is. Pt brother shared that he wishes to await allowing visitors to visit pt until pt is in a regular room that is more comfortable for pt and visitors. Wadie Lessen, PMT NP clarified pt brothers questions regarding pt not eating and how this is a natural process at the end of life.    CSW made referral to [facility], [facility] Conte. Per [employer], no beds available at this time, but that could  change in the coming days. CSW updated Wadie Lessen, PMT NP and pt brother, Gerald Stabs regarding this.    CSW to continue to follow to provide support and assist with pt transition to residential hospice if transfer makes sense when bed available.   Assessment/plan status:  Psychosocial Support/Ongoing Assessment of Needs Other assessment/ plan:   discharge planning   Information/referral to community resources:   Residential Hospice Choices provided.    PATIENT'S/FAMILY'S RESPONSE TO PLAN OF CARE: Pt resting comfortably at this time. Pt brother and pt friend at bedside. Pt brother emotional during assessment and support provided surrounding anticipatory grief process. Pt family want [facility] if transfer makes sense when bed available. Pt brother aware to notify RN if he needs CSW or has any further questions regarding residential hospice process.    Alison Murray, MSW, [place] Work [phone]

## 2014-07-10 NOTE — Consult Note (Signed)
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 Her primary oncologist is Dr. Florene Glen at [place]. She had a long hospitalization recently and was discharged approximately 4 days ago with recommendations of hospice. She returns to the [facility] with symptoms of abdominal distention, generalized fatigue and facial swelling.  Bedside ultrasound currently shows severely reduced biventricular function with LVEF of 10-15% dilated ventricle with severely reduced right ventricular performance. Aortic valve and mitral valve appear normal with minimal regurgitation. The IVC shows lack of respiratory variation.there was minimal amount of pericardial fluid without any echocardiographic evidence of tamponade. In addition, her EKG shows low-voltage QRS and sinus tachycardia.  Furthermore, her lab work shows many abnormalities including signs of possibly healthcare associated pneumonia, DIC with platelet of 28, elevated troponin, proBNP and lactate of 6.9.  We had a long discussion with family including 2 brothers and multiple friends at bedside about current clinical status of Carla Haas. We are very concerned about her acute illness with possibly healthcare associated pneumonia, DIC, acute heart failure exacerbation. We delineated different treatment options with family including aggressive interventions such as central line placement.   However, due to multiorgan involvement without reversible cause and lack of treatment with chemotherapy, family prefers to focus on comfort measures only. Patient is currently DNR/DNI his CODE STATUS. With this in mind, at this point from cardiology standpoint, we would recommend Lasix intermittently as needed for pulmonary edema/shortness of breath for comfort only. Family prefers to send  patient to an inpatient hospice facility if possible. We'll defer this to the primary team.  Thank you very much for this very interesting consult.please do not hesitate to page the on-call cardiology Viola any questions.  Chief complaint: eval heart failure   HPI:  Carla Haas is a 29 year old female with history of T-cell ALL diagnosed in 2013, gated by recent infections including Aspergillus pneumonia, Pseudomonas empyema, C. Difficile colitis, biventricular heart failurediagnosed at [facility] is currently admitted to the hospitalist service with a healthcare associated pneumonia, DIC. Cardiology is being consulted to assist with management of possible heart failure. Most of the history is obtained by reviewing chart from [facility]. It appears the patient was hospitalized at [facility] recently and had a long complicated medical course and was discharged from the hospital on January 9 with recommendations of hospice. Patient ensured had colicky hospitalizationdue to Pseudomonas pneumonia after receiving chemotherapy in November. In addition, she was found to have severe heart failure and volume overload requiring multiple days of intravenous diuresis. She also had Pseudomonas empyema that required VATS procedure and antibiotics. Initially cardiology was consulted for worsening cardiac function on ultrasound. Reportedly patient had normal ejection fraction which had now dropped to severe range with LVEF of 25%. She was given a diagnosis of stress-induced cardiomyopathy versus nonischemic cardiomyopathy secondary to chemotherapy. Presumably due to worsening cardiac function, patient was reportedly told that she is not a candidate for anymore chemotherapy and was recommended to be on hospice due to refractory T-cell ALL. Unfortunately, due to poor social situation initially family was unaware of the grave situation until patient came home with recommendations of hospice.  She  returned to the hospital yesterday with symptoms of abdominal distention, nausea, vomiting and generalized fatigue.patient is fairly somnolent at this point but family states that since discharge patient has complained  of generalized fatigue and facial swelling and abdominal swelling.   Past Medical History Past Medical History  Diagnosis Date  . Urinary tract infection   . Anxiety 2007    SHORT COURSE OF MEDS  . Anemia     TAKING FE  . Infection     UIT X 1  . Infection     TRICH X 1  . Infection     YEAST X1  . 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  . Leukemia   . Ovarian cyst 12-26-2013    Right side  . Pneumonia   . AML (acute myelogenous leukemia)   . Nephrolithiasis 05/21/2014  . Leukemia   . Shock liver 07/10/2014  biventricular severely reduced function  Allergies: No Known Allergies  Social History History   Social History  . Marital Status: Single    Spouse Name: N/A    Number of Children: 3  . Years of Education: 12   Occupational History  .  [employer]   Social History Main Topics  . Smoking status: Never Smoker   . Smokeless tobacco: Never Used  . Alcohol Use: No  . Drug Use: Yes    Special: Marijuana  . Sexual Activity:    Partners: Male    Patent examiner Protection: None   Other Topics Concern  . Not on file   Social History Narrative   ** Merged History Encounter **        Family History Family History  Problem Relation Age of Onset  . Anesthesia problems Neg Hx   . Alcohol abuse Mother   . Lupus Brother     Physical Exam Filed Vitals:   07/10/14 0200  BP: 93/69  Pulse: 104  Temp:   Resp: 10     XVQ:[id number] but easily arousable HEENT:moist mucous membranes, extraocular motions intact Neck:elevated jugular venous distention no carotid bruits KD:[id number], normal S1, normal S2, S3, displaced PMI Pulm:clear to auscultation bilaterally anteriorly Abdomen:soft nontender, mild distention XIP:JASN and  well-perfused with minimal edema  Labs:  Results for orders placed or performed during the hospital encounter of 07/09/14 (from the past 24 hour(s))  Urinalysis, Routine w reflex microscopic     Status: Abnormal   Collection Time: 07/09/14  3:51 PM  Result Value Ref Range   Color, Urine AMBER (A) YELLOW   APPearance CLOUDY (A) CLEAR   Specific Gravity, Urine 1.010 1.005 - 1.030   pH 5.0 5.0 - 8.0   Glucose, UA NEGATIVE NEGATIVE mg/dL   Hgb urine dipstick TRACE (A) NEGATIVE   Bilirubin Urine NEGATIVE NEGATIVE   Ketones, ur NEGATIVE NEGATIVE mg/dL   Protein, ur 30 (A) NEGATIVE mg/dL   Urobilinogen, UA 0.2 0.0 - 1.0 mg/dL   Nitrite NEGATIVE NEGATIVE   Leukocytes, UA NEGATIVE NEGATIVE  Urine microscopic-add on     Status: Abnormal   Collection Time: 07/09/14  3:51 PM  Result Value Ref Range   Squamous Epithelial / LPF FEW (A) RARE   WBC, UA 7-10 <3 WBC/hpf   RBC / HPF 0-2 <3 RBC/hpf   Bacteria, UA FEW (A) RARE  CBC with Differential     Status: Abnormal   Collection Time: 07/09/14  3:58 PM  Result Value Ref Range   WBC 6.4 4.0 - 10.5 K/uL   RBC 3.23 (L) 3.87 - 5.11 MIL/uL   Hemoglobin 10.3 (L) 12.0 - 15.0 g/dL   HCT 31.7 (L) 36.0 - 46.0 %   MCV 98.1 78.0 -  100.0 fL   MCH 31.9 26.0 - 34.0 pg   MCHC 32.5 30.0 - 36.0 g/dL   RDW 29.6 (H) 11.5 - 15.5 %   Platelets 29 (LL) 150 - 400 K/uL   Neutrophils Relative % 76 43 - 77 %   Lymphocytes Relative 17 12 - 46 %   Monocytes Relative 7 3 - 12 %   Eosinophils Relative 0 0 - 5 %   Basophils Relative 0 0 - 1 %   nRBC 12 (H) 0 /100 WBC   Neutro Abs 4.9 1.7 - 7.7 K/uL   Lymphs Abs 1.1 0.7 - 4.0 K/uL   Monocytes Absolute 0.4 0.1 - 1.0 K/uL   Eosinophils Absolute 0.0 0.0 - 0.7 K/uL   Basophils Absolute 0.0 0.0 - 0.1 K/uL   RBC Morphology POLYCHROMASIA PRESENT   Basic metabolic panel     Status: Abnormal   Collection Time: 07/09/14  3:58 PM  Result Value Ref Range   Sodium 136 135 - 145 mmol/L   Potassium 3.8 3.5 - 5.1 mmol/L    Chloride 107 96 - 112 mEq/L   CO2 19 19 - 32 mmol/L   Glucose, Bld 76 70 - 99 mg/dL   BUN 33 (H) 6 - 23 mg/dL   Creatinine, Ser 1.13 (H) 0.50 - 1.10 mg/dL   Calcium 7.4 (L) 8.4 - 10.5 mg/dL   GFR calc non Af Amer 65 (L) >90 mL/min   GFR calc Af Amer 76 (L) >90 mL/min   Anion gap 10 5 - 15  Troponin I     Status: Abnormal   Collection Time: 07/09/14  3:58 PM  Result Value Ref Range   Troponin I 0.14 (H) <0.031 ng/mL  Brain natriuretic peptide     Status: Abnormal   Collection Time: 07/09/14  3:58 PM  Result Value Ref Range   B Natriuretic Peptide 3774.4 (H) 0.0 - 100.0 pg/mL  Hepatic function panel     Status: Abnormal   Collection Time: 07/09/14  3:58 PM  Result Value Ref Range   Total Protein 4.7 (L) 6.0 - 8.3 g/dL   Albumin 2.3 (L) 3.5 - 5.2 g/dL   AST 599 (H) 0 - 37 U/L   ALT 451 (H) 0 - 35 U/L   Alkaline Phosphatase 141 (H) 39 - 117 U/L   Total Bilirubin 1.9 (H) 0.3 - 1.2 mg/dL   Bilirubin, Direct 0.9 (H) 0.0 - 0.3 mg/dL   Indirect Bilirubin 1.0 (H) 0.3 - 0.9 mg/dL  Fibrinogen     Status: Abnormal   Collection Time: 07/09/14 10:02 PM  Result Value Ref Range   Fibrinogen 134 (L) 204 - 475 mg/dL  APTT     Status: Abnormal   Collection Time: 07/09/14 10:02 PM  Result Value Ref Range   aPTT 41 (H) 24 - 37 seconds  Ammonia     Status: None   Collection Time: 07/09/14 11:40 PM  Result Value Ref Range   Ammonia 25 11 - 32 umol/L  Lactic acid, plasma     Status: Abnormal   Collection Time: 07/09/14 11:40 PM  Result Value Ref Range   Lactic Acid, Venous 6.9 (H) 0.5 - 2.2 mmol/L  Protime-INR     Status: Abnormal   Collection Time: 07/09/14 11:40 PM  Result Value Ref Range   Prothrombin Time 27.2 (H) 11.6 - 15.2 seconds   INR 2.49 (H) 0.00 - 1.49  Troponin I (q 6hr x 3)     Status: Abnormal  Collection Time: 07/09/14 11:40 PM  Result Value Ref Range   Troponin I 0.19 (H) <0.031 ng/mL

## 2014-07-10 NOTE — Consult Note (Signed)
Patient Carla Haas      DOB: 1986/06/02      CBS:[id number]     Consult Note from the Palliative Medicine Team at [facility] Requested by: Dr Coralyn Pear     PCP: Default, Provider, MD Reason for Consultation:Clarification of [place] and options     Phone Number:None  Assessment of patients Current state:  Continued physical and functional decline 2/2 to progression of ALL (diagnosed in 2013), multi organ failure, focus is comfort, prognosis is likely days to weeks, family is hopeful for hospice facility.  Consult is for review of medical treatment options, clarification of goals of care and end of life issues, disposition and options, and symptom recommendation.  This NP Carla Haas reviewed medical records, received report from team, assessed the patient and then meet at the patient's bedside along with her brother  Carla Haas to discuss diagnosis, prognosis, GOC, EOL wishes disposition and options.  A discussion was had today regarding advanced directives.  Concepts specific to code status, artifical feeding and hydration, continued IV antibiotics and rehospitalization was had.  The difference between a aggressive medical intervention path  and a palliative comfort care path for this patient at this time was had.  Values and goals of care important to patient and family were attempted to be elicited.  Concept of Hospice and Palliative Care were discussed  Natural trajectory and expectations at EOL were discussed.  Questions and concerns addressed.   Family encouraged to call with questions or concerns.  PMT will continue to support holistically.   Goals of Care: 1.  Code Status:  DNR/DNI-comfort is main focus of care   2. Scope of Treatment:  -Focus of care is comfort, no further diagnostics, artifical feeding or hydration, dc antibiotics on discharge. Comfort feeds as tolerated and symptom management as indicated    3. Disposition:   Hopeful for hospice facility,  will write for choice   4. Symptom Management:   1. Anxiety/Agitation: Ativan 1 mg IV every 6 hrs prn 2. Pain/Dyspnea: Morphine 1 mg IV every 1 hr prn  5. Psychosocial:  Emotional support offered to patient and brother at bedside.  Discussed many layers of  Concern for patient and her family including care of her three children.  Carla Haas herself tells her brother she wants to discuss one on one with her brother and friend Carla Haas.  All family is working together to help meet  patient needs  Educated on services (grief and bereavement) available from area hospice on all family and children  6. Spiritual:  Strong faith support    Brief HPI:  29 yo female with h/o T-cell ALL diagnosed  in 2013, interuppted  treatment at [facility] over the disease trajectory.  Most recently patient treated for infection including Aspergillus pneumonia, Pseudomonas empyema, C. Difficile colitis, biventricular heart failure diagnosed at [facility] is currently admitted  with a healthcare associated pneumonia, DIC.  Overall failure to thrive, limited prognosis of days to weeks.  Transitioning at EOL  ROS: denies discomfort, very lethargic   PMH:  Past Medical History  Diagnosis Date  . Urinary tract infection   . Anxiety 2007    SHORT COURSE OF MEDS  . Anemia     TAKING FE  . Infection     UIT X 1  . Infection     TRICH X 1  . Infection     YEAST X1  . Leukemia, acute lymphoid     dx'd w/i  past 2 months.  . Leukemia   . Ovarian cyst 12-26-2013    Right side  . Pneumonia   . AML (acute myelogenous leukemia)   . Nephrolithiasis 05/21/2014  . Leukemia   . Shock liver 07/10/2014     PSH: Past Surgical History  Procedure Laterality Date  . Cholecystectomy      age 25  . Choley    . Cesarean section  11/20/2011    Procedure: CESAREAN SECTION;  Surgeon: Delice Lesch, MD;  Location: [facility];  Service: Gynecology;  Laterality: N/A;  Primary Cesarean Section Delivery Baby Girl @ 2350   . Power port  2012   I have reviewed the [place] and SH and  If appropriate update it with new information. No Known Allergies Scheduled Meds: . famotidine (PEPCID) IV  20 mg Intravenous Q12H  . methylPREDNISolone (SOLU-MEDROL) injection  60 mg Intravenous Q24H  . mirtazapine  7.5 mg Oral QHS  . piperacillin-tazobactam (ZOSYN)  IV  3.375 g Intravenous Q8H  . vancomycin  125 mg Oral 4 times per day  . vancomycin  1,000 mg Intravenous Q24H   Continuous Infusions:  PRN Meds:.diphenhydrAMINE, fentaNYL, LORazepam, ondansetron (ZOFRAN) IV, oxyCODONE    BP 92/62 mmHg  Pulse 94  Temp(Src) 97.9 F (36.6 C) (Oral)  Resp 24  Ht 4\' 11"  (1.499 m)  Wt 53.4 kg (117 lb 11.6 oz)  BMI 23.77 kg/m2  SpO2 100%   PPS:  20 % at best   Intake/Output Summary (Last 24 hours) at 07/10/14 0958 Last data filed at 07/10/14 0349  Gross per 24 hour  Intake    100 ml  Output      2 ml  Net     98 ml    Physical Exam:  General: weak, lethargic Skin: warm and dry CVS: RRR Abdomen: distended, NT to gentle touch  Labs: CBC    Component Value Date/Time   WBC 6.4 07/09/2014 1558   RBC 3.23* 07/09/2014 1558   HGB 10.3* 07/09/2014 1558   HCT 31.7* 07/09/2014 1558   PLT 29* 07/09/2014 1558   MCV 98.1 07/09/2014 1558   MCH 31.9 07/09/2014 1558   MCHC 32.5 07/09/2014 1558   RDW 29.6* 07/09/2014 1558   LYMPHSABS 1.1 07/09/2014 1558   MONOABS 0.4 07/09/2014 1558   EOSABS 0.0 07/09/2014 1558   BASOSABS 0.0 07/09/2014 1558    BMET    Component Value Date/Time   NA 136 07/09/2014 1558   K 3.8 07/09/2014 1558   CL 107 07/09/2014 1558   CO2 19 07/09/2014 1558   GLUCOSE 76 07/09/2014 1558   BUN 33* 07/09/2014 1558   CREATININE 1.13* 07/09/2014 1558   CALCIUM 7.4* 07/09/2014 1558   GFRNONAA 65* 07/09/2014 1558   GFRAA 76* 07/09/2014 1558    CMP     Component Value Date/Time   NA 136 07/09/2014 1558   K 3.8 07/09/2014 1558   CL 107 07/09/2014 1558   CO2 19 07/09/2014 1558   GLUCOSE 76  07/09/2014 1558   BUN 33* 07/09/2014 1558   CREATININE 1.13* 07/09/2014 1558   CALCIUM 7.4* 07/09/2014 1558   PROT 4.7* 07/09/2014 1558   ALBUMIN 2.3* 07/09/2014 1558   AST 599* 07/09/2014 1558   ALT 451* 07/09/2014 1558   ALKPHOS 141* 07/09/2014 1558   BILITOT 1.9* 07/09/2014 1558   GFRNONAA 65* 07/09/2014 1558   GFRAA 76* 07/09/2014 1558    Time In Time Out Total Time Spent with Patient Total Overall Time  1200 1300 50 min 60 min    Greater than 50%  of this time was spent counseling and coordinating care related to the above assessment and plan.   Carla Lessen NP  Palliative Medicine Team Team Phone # [phone] Pager [phone]  Discussed with Alison Murray  LCSW and Dr Coralyn Pear

## 2014-07-11 DIAGNOSIS — Z66 Do not resuscitate: Secondary | ICD-10-CM

## 2014-07-11 NOTE — Progress Notes (Signed)
[facility] PROGRESS NOTE  Carla Haas DVV:[id number] DOB: 06/01/1986 DOA: 07/09/2014 PCP: Default, Provider, MD  Assessment/Plan: 1. Healthcare associated pneumonia in setting of malignancy -Patient recently treated for Pseudomonas empyema at [facility]. Prior to this hospitalization she had been on ciprofloxacin, changed to IV vancomycin and Zosyn during this hospitalization -She is full comfort, plan to transition to inpatient hospice, will stop IV antimicrobial therapy  2. Refractory T-cell ALL -Given presence of multiorgan failure, advanced disease, with her oncologist at [facility] recommending transition to hospice, not candidate for further aggressive therapies.  3.  Acute systolic congestive heart failure -Patient with history of cardiomyopathy, found to have ejection fraction of 10-15% -Cardiology recommending as needed IV Lasix for comfort  4.  C. difficile colitis -Continue oral vancomycin as tolerated as I think this would be reasonable for comfort  5.  Intractable nausea and vomiting -Likely related to multiple factors including advanced malignancy, heart failure, active infection -Continue supportive care, as needed antiemetic therapy  6.  Goals of care -Patient with history of refractory T-cell ALL, having a recent prolonged hospitalization at [facility] -She presents with multiorgan failure, not candidate for antineoplastic therapy -Goals of care discussed with patient and family members, requesting transition of care to comfort rather pursuing further potential invasive/burdensome interventions that are likely not to change endpoint. -Palliative Care consulted -I spoke with hospice today, unfortunately there are no beds available at [facility] transfer to Med/Surg   Code Status: DO NOT RESUSCITATE Family Communication: Spoke to her brother who was present at bedside, we discussed goals of care Disposition Plan: Will  transfer patient to [place]. comfort care measures   Consultants:  Cardiology  Palliative Care  Antibiotics:  Zosyn stopped on 07/11/2014  Vancomycin stopped on 07/11/2014  HPI/Subjective: Patient is an unfortunate 29 year old female with a past medical history of refractory T-cell ALL, previously diagnosed 12/17/2011 with normal cytogenetics, initiated with pegasparagase per CALGB 62694, s/p cycle 2 of 85462 on 05/05/2014, was followed by Dr. Florene Glen of medical oncology at [facility], cardiomyopathy, admitted to the medicine service on 07/09/2014 presented with nausea, vomiting, abdominal distention. She was diagnosed with relapsed ALL in November and has undergone chemotherapy, then she developed septic shock with neutropenic fever and healthcare associated pneumonia, after that she was transferred to [facility] for continuation of her care that she was found to have stress-induced cardiomyopathy and nonsustained V. tach. In the meantime she was also continued treated for Aspergillus pneumonia with voriconazole. She had Pseudomonas which was pansensitive in her empyema after VATS for which she was initially on Meronem and later on switched to ciprofloxacin. Ultimately her oncologist recommended for prognosis of her ALL, and recommended her hospice. During this hospitalization she was found to have an ejection fraction of 10-15%. Goals of care discussed with patient and family members during this hospitalization as the goal of her care as been transitioned to comfort.    Objective: Filed Vitals:   07/11/14 0800  BP:   Pulse:   Temp: 98.2 F (36.8 C)  Resp:     Intake/Output Summary (Last 24 hours) at 07/11/14 1202 Last data filed at 07/11/14 1018  Gross per 24 hour  Intake  552.5 ml  Output    204 ml  Net  348.5 ml   Filed Weights   07/09/14 1652 07/10/14 0500  Weight: 45.36 kg (100 lb) 53.4 kg (117 lb 11.6 oz)    Exam:   General:  Ill-appearing, sedated,  no  acute distress she was arousable  Cardiovascular: Tachycardic, regular rate and rhythm normal S1-S2  Respiratory: Diminished breath sounds, bibasilar crackles, positive rhonchi, no wheezing  Abdomen: Her abdomen is distended, did not appear to have pain to palpation  Musculoskeletal: No edema  Data Reviewed: Basic Metabolic Panel:  Recent Labs Lab 07/09/14 1558  NA 136  K 3.8  CL 107  CO2 19  GLUCOSE 76  BUN 33*  CREATININE 1.13*  CALCIUM 7.4*   Liver Function Tests:  Recent Labs Lab 07/09/14 1558  AST 599*  ALT 451*  ALKPHOS 141*  BILITOT 1.9*  PROT 4.7*  ALBUMIN 2.3*   No results for input(s): LIPASE, AMYLASE in the last 168 hours.  Recent Labs Lab 07/09/14 2340  AMMONIA 25   CBC:  Recent Labs Lab 07/09/14 1558  WBC 6.4  NEUTROABS 4.9  HGB 10.3*  HCT 31.7*  MCV 98.1  PLT 29*   Cardiac Enzymes:  Recent Labs Lab 07/09/14 1558 07/09/14 2340  TROPONINI 0.14* 0.19*   BNP (last 3 results)  Recent Labs  05/21/14 0500  PROBNP 14679.0*   CBG: No results for input(s): GLUCAP in the last 168 hours.  Recent Results (from the past 240 hour(s))  Culture, blood (routine x 2)     Status: None (Preliminary result)   Collection Time: 07/09/14  3:58 PM  Result Value Ref Range Status   Specimen Description BLOOD RIGHT HAND  Final   Special Requests BOTTLES DRAWN AEROBIC AND ANAEROBIC 5CC  Final   Culture   Final           BLOOD CULTURE RECEIVED NO GROWTH TO DATE CULTURE WILL BE HELD FOR 5 DAYS BEFORE ISSUING A FINAL NEGATIVE REPORT Performed at [facility]    Report Status PENDING  Incomplete  Culture, blood (routine x 2)     Status: None (Preliminary result)   Collection Time: 07/09/14  3:58 PM  Result Value Ref Range Status   Specimen Description BLOOD RIGHT UPPER ARM   Final   Special Requests BOTTLES DRAWN AEROBIC AND ANAEROBIC 5ML  Final   Culture   Final           BLOOD CULTURE RECEIVED NO GROWTH TO DATE CULTURE WILL BE HELD FOR  5 DAYS BEFORE ISSUING A FINAL NEGATIVE REPORT Performed at [facility]    Report Status PENDING  Incomplete  MRSA PCR Screening     Status: None   Collection Time: 07/09/14 11:20 PM  Result Value Ref Range Status   MRSA by PCR NEGATIVE NEGATIVE Final    Comment:        The GeneXpert MRSA Assay (FDA approved for NASAL specimens only), is one component of a comprehensive MRSA colonization surveillance program. It is not intended to diagnose MRSA infection nor to guide or monitor treatment for MRSA infections.      Studies: Ct Abdomen Pelvis W Contrast  07/09/2014   CLINICAL DATA:  Progressive abdominal distention for 4 days. History of leukemia.  EXAM: CT ABDOMEN AND PELVIS WITH CONTRAST  TECHNIQUE: Multidetector CT imaging of the abdomen and pelvis was performed using the standard protocol following bolus administration of intravenous contrast.  CONTRAST:  17mL OMNIPAQUE IOHEXOL 300 MG/ML  SOLN  COMPARISON:  CT 05/16/2014  FINDINGS: Development of moderate pericardial effusion measuring 2-3 cm. Development of small bilateral pleural effusions. Right lower and middle lobe airspace disease with air bronchograms.  There is a moderate volume ascites in the upper abdomen. Moderate to  large volume of ascites in the pelvis.  There is diffuse thickening and increased enhancement of the proximal small bowel loops. Diminished enhancement of distal small bowel loops. Small amount of stool is seen throughout the colon. Detailed bowel evaluation is limited given lack of oral contrast.  The liver appears enlarged and is diffusely heterogeneous and nodular in enhancement. Clips in the gallbladder fossa from cholecystectomy. The spleen is normal in size. There is no focal pancreatic abnormality. There is heterogeneous enhancement of both kidneys. There is no adrenal nodule. The adrenal glands appear are hyperenhancing. There is diffuse subcutaneous soft tissue edema.  Bladder is well distended. Uterus  is grossly unremarkable. The abdominal aorta is normal in caliber.  There is no focal osseous abnormality.  IMPRESSION: 1. Moderate to large volume of intra-abdominal/pelvic ascites. Moderate pericardial effusion, bilateral pleural effusions. Diffuse soft tissue edema. 2. There are findings suggesting altered for fusion and concerning for circulatory shock. This includes abnormal appearance of small bowel, hyper enhancement of the adrenal glands, and heterogeneous perfusion of the liver. 3. Right lower and middle lobe consolidations with air bronchograms concerning for pneumonia.   Electronically Signed   By: Jeb Levering M.D.   On: 07/09/2014 23:19   Dg Chest Port 1 View  07/09/2014   CLINICAL DATA:  Cough, abdominal distension, facial swelling  EXAM: PORTABLE CHEST - 1 VIEW  COMPARISON:  05/21/2014  FINDINGS: Cardiomegaly is noted. Right Port-A-Cath with tip in right atrium. No pulmonary edema. There is right lower lobe infiltrate/ consolidation highly suspicious for pneumonia.  IMPRESSION: Cardiomegaly. Stable right Port-A-Cath position. There is right lower lobe infiltrate/consolidation highly suspicious for pneumonia.   Electronically Signed   By: Lahoma Crocker M.D.   On: 07/09/2014 16:27    Scheduled Meds: . famotidine (PEPCID) IV  20 mg Intravenous Q12H  . methylPREDNISolone (SOLU-MEDROL) injection  60 mg Intravenous Q24H  . piperacillin-tazobactam (ZOSYN)  IV  3.375 g Intravenous Q8H  . vancomycin  125 mg Oral 4 times per day  . vancomycin  1,000 mg Intravenous Q24H   Continuous Infusions:   Principal Problem:   HCAP (healthcare-associated pneumonia) Active Problems:   Intractable nausea and vomiting   Antineoplastic chemotherapy induced pancytopenia   Parapneumonic effusion   C. difficile colitis   Abnormal transaminases   DIC (disseminated intravascular coagulation)   Shock liver   DNR (do not resuscitate)   Dyspnea   Pain, generalized   Abdominal pain    Time spent: 25  min    Kelvin Cellar  [facility] Pager [phone]. If 7PM-7AM, please contact night-coverage at [email or web address], password [facility] 07/11/2014, 12:02 PM  LOS: 2 days

## 2014-07-11 NOTE — Progress Notes (Addendum)
Pt's brother  came to nurse's station saying that pt now requesting to go home instead of [facility]. Saying she is coming to terms with her condition and prefers to go home. Will page Dr Coralyn Pear and update.

## 2014-07-12 MED ORDER — HYDROMORPHONE HCL 1 MG/ML IJ SOLN
1.0000 mg | INTRAMUSCULAR | Status: DC | PRN
Start: 2014-07-12 — End: 2014-07-13
  Administered 2014-07-12: 1 mg via INTRAVENOUS
  Filled 2014-07-12: qty 1

## 2014-07-12 MED ORDER — ALUM & MAG HYDROXIDE-SIMETH 200-200-20 MG/5ML PO SUSP
30.0000 mL | ORAL | Status: DC | PRN
  Administered 2014-07-12: 30 mL via ORAL
  Filled 2014-07-12: qty 30

## 2014-07-12 NOTE — Progress Notes (Signed)
[facility] PROGRESS NOTE  Carla Haas OQH:[id number] DOB: Oct 26, 1985 DOA: 07/09/2014 PCP: Default, Provider, MD  Interim Summary Patient is an unfortunate 29 year old female with a past medical history of refractory T-cell ALL, previously diagnosed 12/17/2011 with normal cytogenetics, initiated with pegasparagase per CALGB 54656, s/p cycle 2 of 81275 on 05/05/2014, was followed by Dr. Florene Glen of medical oncology at [facility], cardiomyopathy, admitted to the medicine service on 07/09/2014 presented with nausea, vomiting, abdominal distention. She was diagnosed with relapsed ALL in November and has undergone chemotherapy, then she developed septic shock with neutropenic fever and healthcare associated pneumonia, after that she was transferred to [facility] for continuation of her care that she was found to have stress-induced cardiomyopathy and nonsustained V. tach. In the meantime she was also continued treated for Aspergillus pneumonia with voriconazole. She had Pseudomonas which was pansensitive in her empyema after VATS for which she was initially on Meronem and later on switched to ciprofloxacin. Ultimately her oncologist recommended for prognosis of her ALL, and recommended her hospice. During this hospitalization she was found to have an ejection fraction of 10-15%. Goals of care discussed with patient and family members during this hospitalization as the goal of her care as been transitioned to comfort. Currently awaiting bed availability at inpatient hospice.   Assessment/Plan: 1. Healthcare associated pneumonia in setting of malignancy -Patient recently treated for Pseudomonas empyema at [facility] Prior to this hospitalization she had been on ciprofloxacin, changed to IV vancomycin and Zosyn during this hospitalization -She is full comfort, plan to transition to inpatient hospice -IV AB's stopped  2. Refractory T-cell ALL -Given presence of multiorgan  failure, advanced disease, with her oncologist at [facility] recommending transition to hospice, not candidate for further aggressive therapies.  3.  Acute systolic congestive heart failure -Patient with history of cardiomyopathy, found to have ejection fraction of 10-15% -Cardiology recommending as needed IV Lasix for comfort  4.  C. difficile colitis -Continue oral vancomycin as tolerated as I think this would be reasonable for comfort  5.  Intractable nausea and vomiting -Likely related to multiple factors including advanced malignancy, heart failure, active infection -Continue supportive care, as needed antiemetic therapy  6.  Goals of care -Patient with history of refractory T-cell ALL, having a recent prolonged hospitalization at [facility] -She presents with multiorgan failure, not candidate for antineoplastic therapy -Goals of care discussed with patient and family members, requesting transition of care to comfort rather pursuing further potential invasive/burdensome interventions that are likely not to change endpoint. -Palliative Care consulted -I spoke with hospice, unfortunately there are no beds available at [facility] -Hopefully bed will open up in the next 24 hours   Code Status: DO NOT RESUSCITATE Family Communication: Spoke to her brother who was present at bedside, we discussed goals of care Disposition Plan: Awaiting bed availability at inpatient hospice facilty   Consultants:  Cardiology  Palliative Care  Antibiotics:  Zosyn stopped on 07/11/2014  Vancomycin stopped on 07/11/2014  HPI/Subjective: Patient denies pain, shortness of breath, nausea, vomiting.   Objective: Filed Vitals:   07/12/14 0648  BP: 99/63  Pulse: 103  Temp: 97.3 F (36.3 C)  Resp: 20    Intake/Output Summary (Last 24 hours) at 07/12/14 1206 Last data filed at 07/12/14 0900  Gross per 24 hour  Intake    120 ml  Output      0 ml  Net    120 ml   [facility]  07/09/14 1652 07/10/14 0500 07/11/14 1600  Weight: 45.36 kg (100 lb) 53.4 kg (117 lb 11.6 oz) 48.6 kg (107 lb 2.3 oz)    Exam:   General:  Ill-appearing, sedated, no acute distress she was arousable  Cardiovascular: Tachycardic, regular rate and rhythm normal S1-S2  Respiratory: Diminished breath sounds, bibasilar crackles, positive rhonchi, no wheezing  Abdomen: Her abdomen is distended, did not appear to have pain to palpation  Musculoskeletal: No edema  Data Reviewed: Basic Metabolic Panel:  Recent Labs Lab 07/09/14 1558  NA 136  K 3.8  CL 107  CO2 19  GLUCOSE 76  BUN 33*  CREATININE 1.13*  CALCIUM 7.4*   Liver Function Tests:  Recent Labs Lab 07/09/14 1558  AST 599*  ALT 451*  ALKPHOS 141*  BILITOT 1.9*  PROT 4.7*  ALBUMIN 2.3*   No results for input(s): LIPASE, AMYLASE in the last 168 hours.  Recent Labs Lab 07/09/14 2340  AMMONIA 25   CBC:  Recent Labs Lab 07/09/14 1558  WBC 6.4  NEUTROABS 4.9  HGB 10.3*  HCT 31.7*  MCV 98.1  PLT 29*   Cardiac Enzymes:  Recent Labs Lab 07/09/14 1558 07/09/14 2340  TROPONINI 0.14* 0.19*   BNP (last 3 results)  Recent Labs  05/21/14 0500  PROBNP 14679.0*   CBG: No results for input(s): GLUCAP in the last 168 hours.  Recent Results (from the past 240 hour(s))  Culture, blood (routine x 2)     Status: None (Preliminary result)   Collection Time: 07/09/14  3:58 PM  Result Value Ref Range Status   Specimen Description BLOOD RIGHT HAND  Final   Special Requests BOTTLES DRAWN AEROBIC AND ANAEROBIC 5CC  Final   Culture   Final           BLOOD CULTURE RECEIVED NO GROWTH TO DATE CULTURE WILL BE HELD FOR 5 DAYS BEFORE ISSUING A FINAL NEGATIVE REPORT Performed at [facility]    Report Status PENDING  Incomplete  Culture, blood (routine x 2)     Status: None (Preliminary result)   Collection Time: 07/09/14  3:58 PM  Result Value Ref Range Status   Specimen Description BLOOD  RIGHT UPPER ARM   Final   Special Requests BOTTLES DRAWN AEROBIC AND ANAEROBIC 5ML  Final   Culture   Final           BLOOD CULTURE RECEIVED NO GROWTH TO DATE CULTURE WILL BE HELD FOR 5 DAYS BEFORE ISSUING A FINAL NEGATIVE REPORT Performed at [facility]    Report Status PENDING  Incomplete  MRSA PCR Screening     Status: None   Collection Time: 07/09/14 11:20 PM  Result Value Ref Range Status   MRSA by PCR NEGATIVE NEGATIVE Final    Comment:        The GeneXpert MRSA Assay (FDA approved for NASAL specimens only), is one component of a comprehensive MRSA colonization surveillance program. It is not intended to diagnose MRSA infection nor to guide or monitor treatment for MRSA infections.      Studies: No results found.  Scheduled Meds: . famotidine (PEPCID) IV  20 mg Intravenous Q12H  . methylPREDNISolone (SOLU-MEDROL) injection  60 mg Intravenous Q24H  . vancomycin  125 mg Oral 4 times per day   Continuous Infusions:   Principal Problem:   HCAP (healthcare-associated pneumonia) Active Problems:   Intractable nausea and vomiting   Antineoplastic chemotherapy induced pancytopenia   Parapneumonic effusion   C. difficile colitis  Abnormal transaminases   DIC (disseminated intravascular coagulation)   Shock liver   DNR (do not resuscitate)   Dyspnea   Pain, generalized   Abdominal pain    Time spent: 25 min    Kelvin Cellar  [facility] Pager [phone]. If 7PM-7AM, please contact night-coverage at [email or web address], password [facility] 07/12/2014, 12:06 PM  LOS: 3 days

## 2014-07-13 MED ORDER — VANCOMYCIN 50 MG/ML ORAL SOLUTION: 125.0000 mg | Freq: Four times a day (QID) | ORAL | Status: DC

## 2014-07-13 MED ORDER — VANCOMYCIN 50 MG/ML ORAL SOLUTION: ORAL | Status: AC

## 2014-07-13 MED ORDER — MORPHINE SULFATE (CONCENTRATE) 10 MG /0.5 ML PO SOLN: 5.0000 mg | ORAL | Status: AC | PRN

## 2014-07-13 NOTE — Progress Notes (Signed)
CSW continuing to follow for disposition planning.  CSW received phone call from [facility], Erling Conte stating that weekend notes reflected that pt and pt family were indicating wishes for pt to go home with hospice and requesting CSW to clarify with pt and pt brother, Gerald Stabs to clarify wishes for disposition.  CSW met with pt and pt brother, Gerald Stabs at bedside. CSW discussed with pt and pt brother regarding the mention in the chart about possibly wanting pt to return home with hospice. Pt brother stated that they have further discussed plans and pt and pt family want [facility] for disposition. CSW provided emotional support as pt brother discussed that pt is very eager to leave the hospital as he wants to make her room at [facility] more home like and decorate it with things that the pt enjoys.   CSW notified [facility] liaison, Erling Conte that pt and pt brother confirmed wishes for [facility] for pt disposition.   CSW received notification from [facility], Erling Conte that facility has bed available for pt today. Pt and pt brother, Gerald Stabs made aware.   CSW noted that on Friday 07/10/14 patient wrote down her wishes in front of [place], [place], and [place] with RN's present to witness. CSW provided [facility], Erling Conte with the paper that pt wrote her wishes on and Erling Conte is also [employer] worker will follow up regarding these wishes.   CSW faxed pt discharge summary to [facility], provided RN phone number to call report, and arranged for ambulance transport via [facility] to [facility].   Pt brother notified of transport being arranged. Pt being assisted by Nurse Techs at this time.   No further social work needs identified at this time. Pt will have Education officer, museum at [employer] to follow and assist with pt psychosocial end of life needs/wishes.  CSW signing off.   Alison Murray, MSW, [place] Work [phone]

## 2014-07-13 NOTE — Progress Notes (Signed)
Patient stable at time of discharge. IV was removed. I reported off to Presenter, broadcasting at [employer].

## 2014-07-13 NOTE — Discharge Summary (Signed)
Physician Discharge Summary  KERYL GHOLSON MRN: [id number] DOB/AGE: [birth year] 29 y.o.  PCP: Default, Provider, MD   Admit date: 07/09/2014 Discharge date: 07/13/2014  Discharge Diagnoses:      HCAP (healthcare-associated pneumonia) Active Problems:   Intractable nausea and vomiting   Antineoplastic chemotherapy induced pancytopenia   Parapneumonic effusion   C. difficile colitis   Abnormal transaminases   DIC (disseminated intravascular coagulation)   Shock liver   DNR (do not resuscitate)   Dyspnea   Pain, generalized   Abdominal pain     Medication List    STOP taking these medications        bumetanide 0.5 MG tablet  Commonly known as:  BUMEX     calcium-vitamin D 500-200 MG-UNIT per tablet  Commonly known as:  OSCAL WITH D     ciprofloxacin 500 MG tablet  Commonly known as:  CIPRO     FIRST-VANCOMYCIN 25 25 MG/ML Soln  Replaced by:  vancomycin 50 mg/mL oral solution     metoprolol tartrate 25 MG tablet  Commonly known as:  LOPRESSOR     polyvinyl alcohol 1.4 % ophthalmic solution  Commonly known as:  LIQUIFILM TEARS      TAKE these medications        docusate sodium 100 MG capsule  Commonly known as:  COLACE  Take 100 mg by mouth 2 (two) times daily.     dronabinol 2.5 MG capsule  Commonly known as:  MARINOL  Take 2.5 mg by mouth 2 (two) times daily before a meal.     glucose 4 GM chewable tablet  Chew 16 tablets by mouth as needed for low blood sugar.     MAGOX 400 400 (241.3 MG) MG tablet  Generic drug:  magnesium oxide  Take 400 mg by mouth daily.     mirtazapine 7.5 MG tablet  Commonly known as:  REMERON  Take 7.5 mg by mouth at bedtime.     morphine CONCENTRATE 10 mg / 0.5 ml concentrated solution  Take 0.25 mLs (5 mg total) by mouth every 2 (two) hours as needed for severe pain.     prochlorperazine 10 MG tablet  Commonly known as:  COMPAZINE  Take 10 mg by mouth every 6 (six) hours as needed for nausea or vomiting.      vancomycin 50 mg/mL oral solution  Commonly known as:  VANCOCIN  - 125 mg orally four times daily for 7 to 14 days   - 125 mg orally twice daily for 7 days   - 125 mg orally once daily for 7 days   - 125 mg orally every other day for 7 days   - 125 mg orally every 3 days for 14 days     voriconazole 50 MG tablet  Commonly known as:  VFEND  Take 150 mg by mouth 2 (two) times daily.     zolpidem 5 MG tablet  Commonly known as:  AMBIEN  Take 5 mg by mouth at bedtime as needed for sleep.        Discharge Condition:   Disposition: 02-Short Term Hospital   Consults:    Significant Diagnostic Studies: Ct Abdomen Pelvis W Contrast  07/09/2014   CLINICAL DATA:  Progressive abdominal distention for 4 days. History of leukemia.  EXAM: CT ABDOMEN AND PELVIS WITH CONTRAST  TECHNIQUE: Multidetector CT imaging of the abdomen and pelvis was performed using the standard protocol following bolus administration of intravenous contrast.  CONTRAST:  61mL  OMNIPAQUE IOHEXOL 300 MG/ML  SOLN  COMPARISON:  CT 05/16/2014  FINDINGS: Development of moderate pericardial effusion measuring 2-3 cm. Development of small bilateral pleural effusions. Right lower and middle lobe airspace disease with air bronchograms.  There is a moderate volume ascites in the upper abdomen. Moderate to large volume of ascites in the pelvis.  There is diffuse thickening and increased enhancement of the proximal small bowel loops. Diminished enhancement of distal small bowel loops. Small amount of stool is seen throughout the colon. Detailed bowel evaluation is limited given lack of oral contrast.  The liver appears enlarged and is diffusely heterogeneous and nodular in enhancement. Clips in the gallbladder fossa from cholecystectomy. The spleen is normal in size. There is no focal pancreatic abnormality. There is heterogeneous enhancement of both kidneys. There is no adrenal nodule. The adrenal glands appear are hyperenhancing.  There is diffuse subcutaneous soft tissue edema.  Bladder is well distended. Uterus is grossly unremarkable. The abdominal aorta is normal in caliber.  There is no focal osseous abnormality.  IMPRESSION: 1. Moderate to large volume of intra-abdominal/pelvic ascites. Moderate pericardial effusion, bilateral pleural effusions. Diffuse soft tissue edema. 2. There are findings suggesting altered for fusion and concerning for circulatory shock. This includes abnormal appearance of small bowel, hyper enhancement of the adrenal glands, and heterogeneous perfusion of the liver. 3. Right lower and middle lobe consolidations with air bronchograms concerning for pneumonia.   Electronically Signed   By: Jeb Levering M.D.   On: 07/09/2014 23:19   Dg Chest Port 1 View  07/09/2014   CLINICAL DATA:  Cough, abdominal distension, facial swelling  EXAM: PORTABLE CHEST - 1 VIEW  COMPARISON:  05/21/2014  FINDINGS: Cardiomegaly is noted. Right Port-A-Cath with tip in right atrium. No pulmonary edema. There is right lower lobe infiltrate/ consolidation highly suspicious for pneumonia.  IMPRESSION: Cardiomegaly. Stable right Port-A-Cath position. There is right lower lobe infiltrate/consolidation highly suspicious for pneumonia.   Electronically Signed   By: Lahoma Crocker M.D.   On: 07/09/2014 16:27      Microbiology: Recent Results (from the past 240 hour(s))  Culture, blood (routine x 2)     Status: None (Preliminary result)   Collection Time: 07/09/14  3:58 PM  Result Value Ref Range Status   Specimen Description BLOOD RIGHT HAND  Final   Special Requests BOTTLES DRAWN AEROBIC AND ANAEROBIC 5CC  Final   Culture   Final           BLOOD CULTURE RECEIVED NO GROWTH TO DATE CULTURE WILL BE HELD FOR 5 DAYS BEFORE ISSUING A FINAL NEGATIVE REPORT Performed at [facility]    Report Status PENDING  Incomplete  Culture, blood (routine x 2)     Status: None (Preliminary result)   Collection Time: 07/09/14  3:58 PM   Result Value Ref Range Status   Specimen Description BLOOD RIGHT UPPER ARM   Final   Special Requests BOTTLES DRAWN AEROBIC AND ANAEROBIC 5ML  Final   Culture   Final           BLOOD CULTURE RECEIVED NO GROWTH TO DATE CULTURE WILL BE HELD FOR 5 DAYS BEFORE ISSUING A FINAL NEGATIVE REPORT Performed at [facility]    Report Status PENDING  Incomplete  MRSA PCR Screening     Status: None   Collection Time: 07/09/14 11:20 PM  Result Value Ref Range Status   MRSA by PCR NEGATIVE NEGATIVE Final    Comment:  The GeneXpert MRSA Assay (FDA approved for NASAL specimens only), is one component of a comprehensive MRSA colonization surveillance program. It is not intended to diagnose MRSA infection nor to guide or monitor treatment for MRSA infections.      Labs: No results found for this or any previous visit (from the past 48 hour(s)).   Interim Summary Patient is an unfortunate 29 year old female with a past medical history of refractory T-cell ALL, previously diagnosed 12/17/2011 with normal cytogenetics, initiated with pegasparagase per CALGB 75102, s/p cycle 2 of 58527 on 05/05/2014, was followed by Dr. Florene Glen of medical oncology at [facility], cardiomyopathy, admitted to the medicine service on 07/09/2014 presented with nausea, vomiting, abdominal distention. She was diagnosed with relapsed ALL in November and has undergone chemotherapy, then she developed septic shock with neutropenic fever and healthcare associated pneumonia, after that she was transferred to [facility] for continuation of her care that she was found to have stress-induced cardiomyopathy and nonsustained V. tach. In the meantime she was also continued treated for Aspergillus pneumonia with voriconazole. She had Pseudomonas which was pansensitive in her empyema after VATS for which she was initially on Meronem and later on switched to ciprofloxacin. Ultimately her oncologist recommended for  prognosis of her ALL, and recommended her hospice. During this hospitalization she was found to have an ejection fraction of 10-15%. Goals of care discussed with patient and family members during this hospitalization as the goal of her care as been transitioned to comfort. Currently awaiting bed availability at inpatient hospice.   Assessment/Plan:  Healthcare associated pneumonia in setting of malignancy -Patient recently treated for Pseudomonas empyema at [facility]. Prior to this hospitalization she had been on ciprofloxacin, changed to IV vancomycin and Zosyn during this hospitalization -She is full comfort, patient is being transferred [facility] -IV AB's stopped  2. Refractory T-cell ALL -Given presence of multiorgan failure, advanced disease, with her oncologist at [facility] recommending transition to hospice, not candidate for further aggressive therapies.  3. Acute systolic congestive heart failure -Patient with history of cardiomyopathy, found to have ejection fraction of 10-15% -Cardiology recommending as needed IV Lasix for comfort  4. C. difficile colitis -Continue oral vancomycin as tolerated as I think this would be reasonable for comfort  5. Intractable nausea and vomiting -Likely related to multiple factors including advanced malignancy, heart failure, active infection -Continue supportive care, as needed antiemetic therapy  6. Goals of care -Patient with history of refractory T-cell ALL, having a recent prolonged hospitalization at [facility] -She presents with multiorgan failure, not candidate for antineoplastic therapy -Goals of care discussed with patient and family members, requesting transition of care to comfort rather pursuing further potential invasive/burdensome interventions that are likely not to change endpoint. -Palliative Care consulted -I spoke with hospice, unfortunately there are no beds available at [facility] -Hopefully bed will open up in the next 24 hours   Code Status: DO NOT RESUSCITATE Family Communication: Spoke to her brother who was present at bedside, we discussed goals of care Disposition Plan: Awaiting bed availability at inpatient hospice facilty      Discharge Exam:  Blood pressure 107/76, pulse 110, temperature 97.6 F (36.4 C), temperature source Oral, resp. rate 20, height 4\' 11"  (1.499 m), weight 48.6 kg (107 lb 2.3 oz), SpO2 90 %.  General: Ill-appearing, sedated, no acute distress she was arousable  Cardiovascular: Tachycardic, regular rate and rhythm normal S1-S2  Respiratory: Diminished breath sounds, bibasilar crackles, positive rhonchi,  no wheezing  Abdomen: Her abdomen is distended, did not appear to have pain to palpation  Musculoskeletal: No edema          Discharge Instructions    Diet - low sodium heart healthy    Complete by:  As directed      Increase activity slowly    Complete by:  As directed              Signed: Perez Dirico 07/13/2014, 10:17 AM

## 2014-07-14 ENCOUNTER — Institutional Professional Consult (permissible substitution): Payer: Self-pay | Admitting: Internal Medicine

## 2014-07-16 LAB — CULTURE, BLOOD (ROUTINE X 2)
CULTURE: NO GROWTH
Culture: NO GROWTH

## 2014-08-10 ENCOUNTER — Institutional Professional Consult (permissible substitution): Payer: Self-pay | Admitting: Internal Medicine

## 2014-09-25 DEATH — deceased

## 2015-10-14 IMAGING — US US ABDOMEN COMPLETE
1 series · 13 of 25 positions shown · non-contrast
Comparison: CT of the chest 12/17/2011

CLINICAL DATA: Right upper quadrant pain and elevated liver
enzymes.

EXAM:
ULTRASOUND ABDOMEN COMPLETE

[Series 1: us abdomen complete · 0.21mm/px · 13 of 59 slices shown]
[im 1/59]
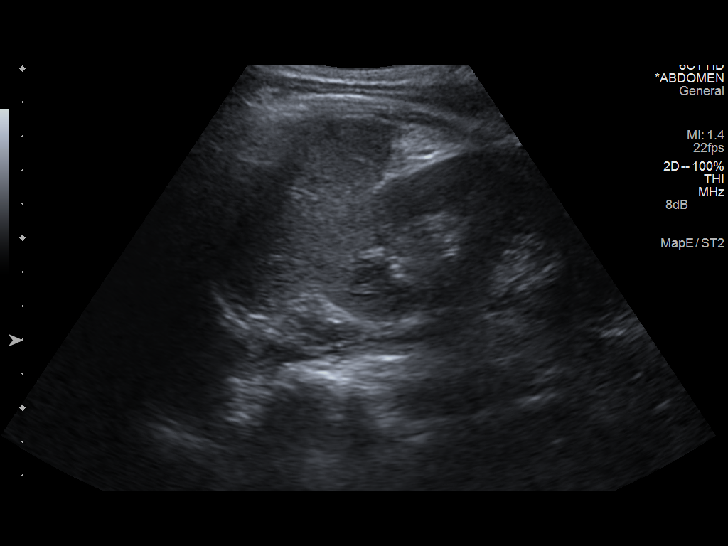
[im 5/59]
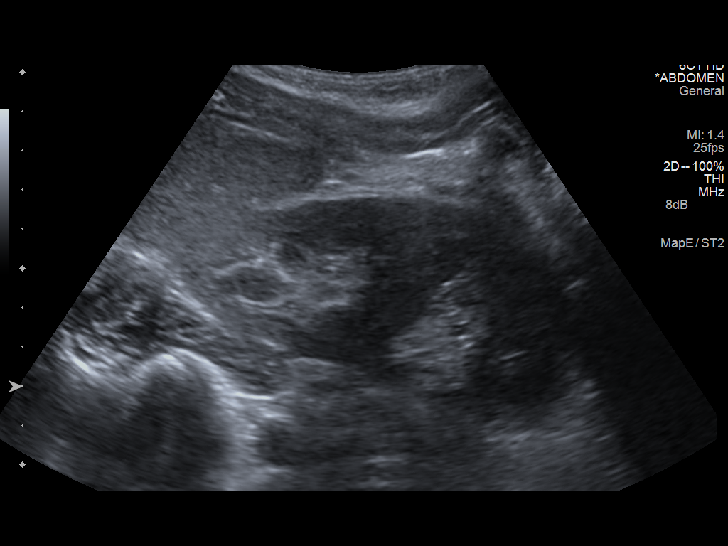
[im 10/59]
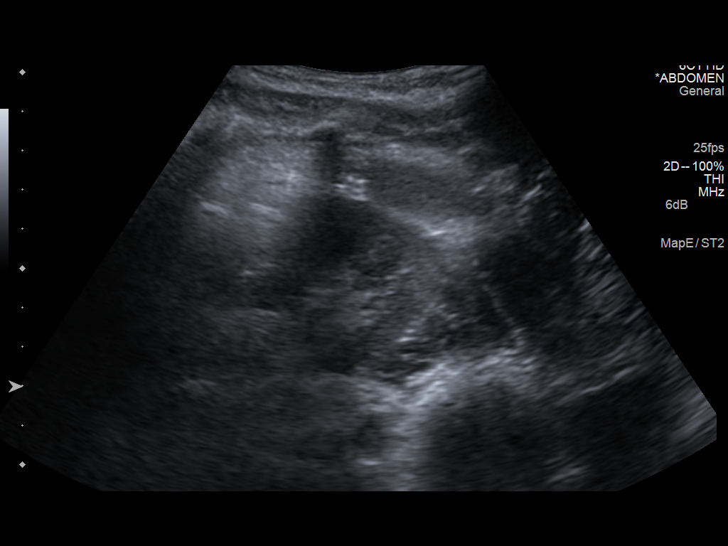
[im 15/59]
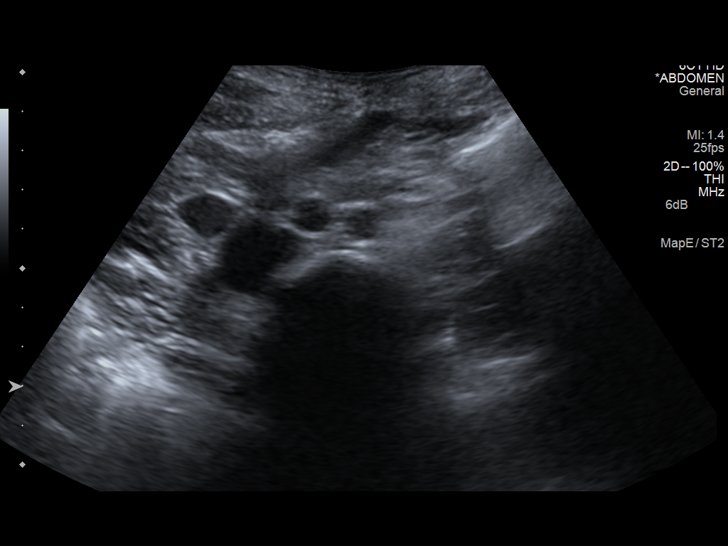
[im 20/59]
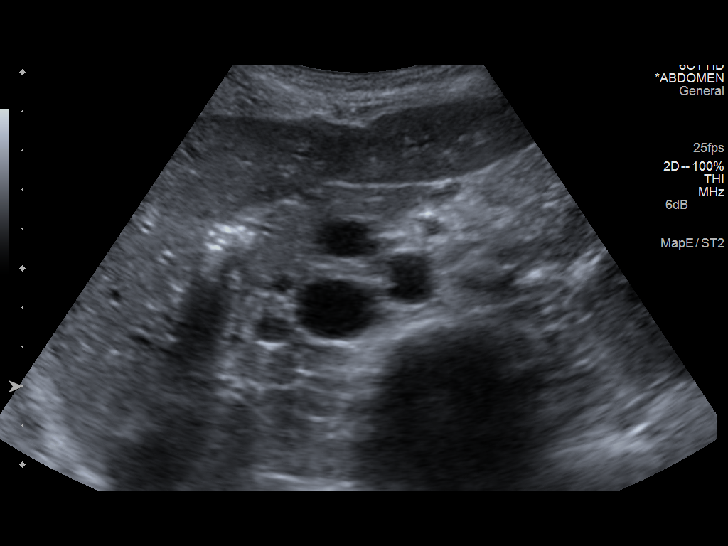
[im 25/59]
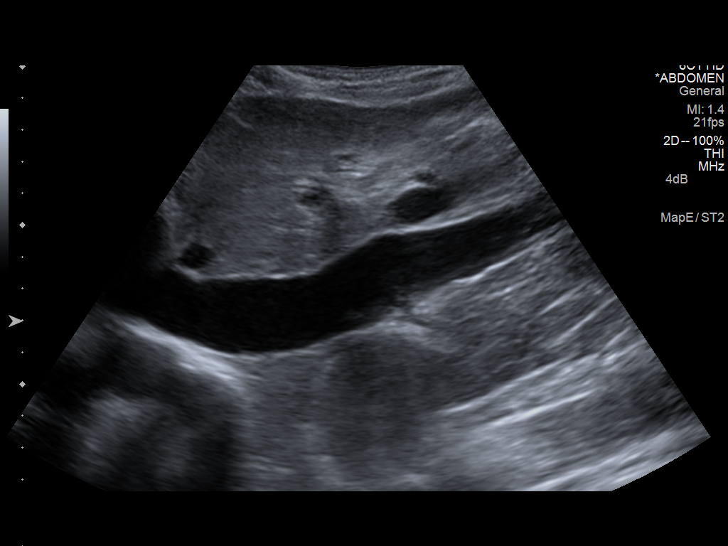
[im 30/59]
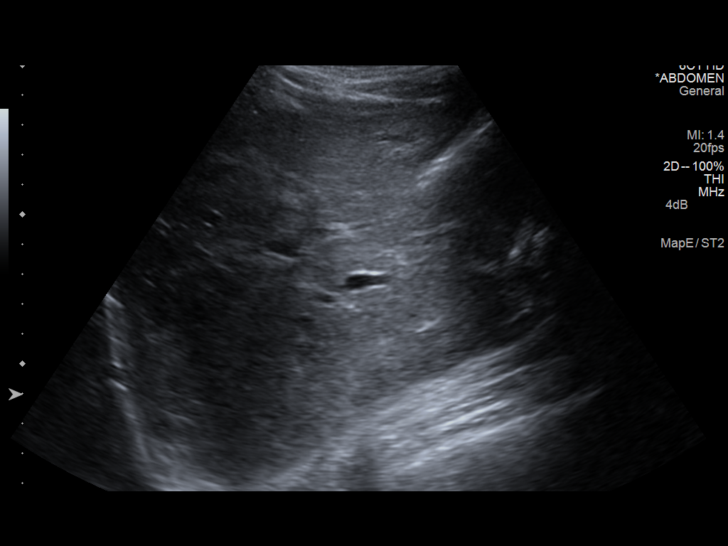
[im 34/59]
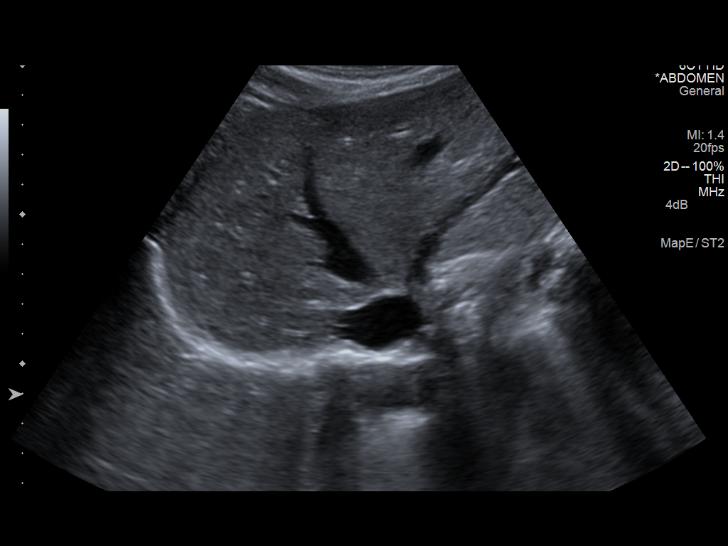
[im 39/59]
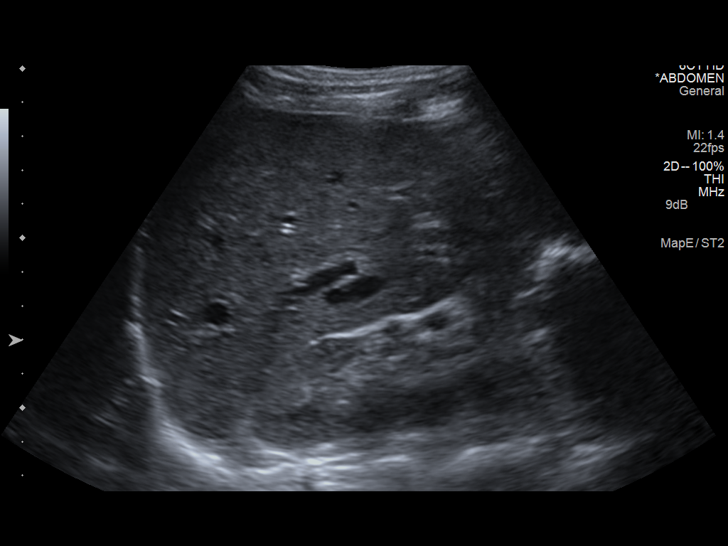
[im 44/59]
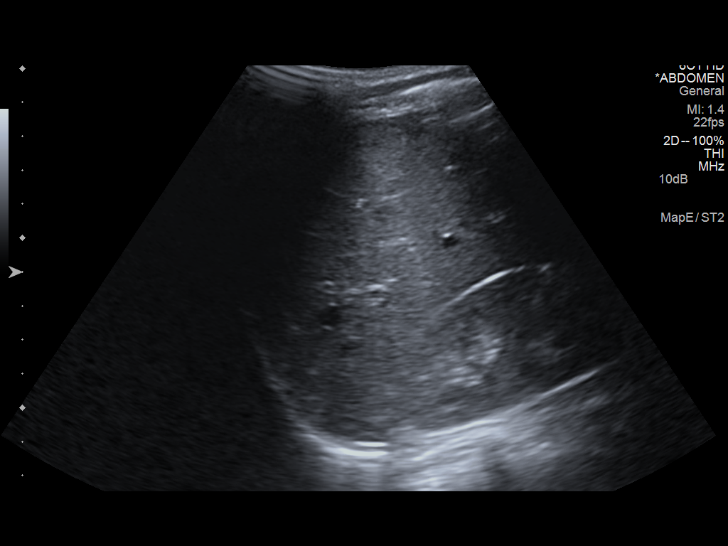
[im 49/59]
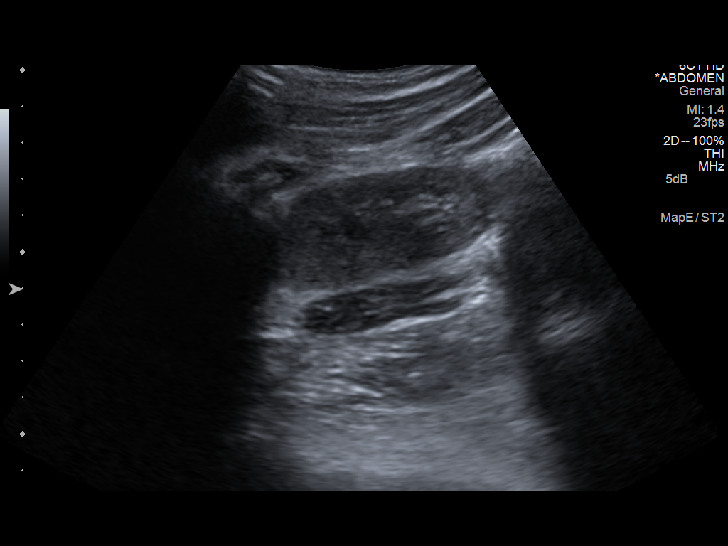
[im 54/59]
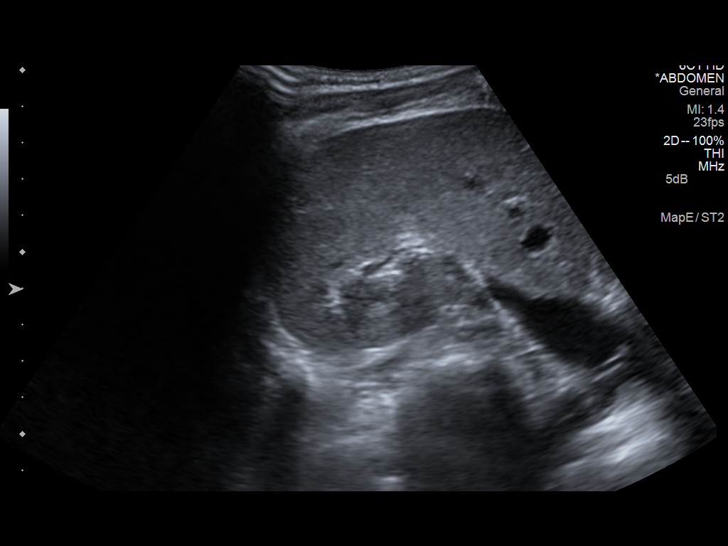
[im 59/59]
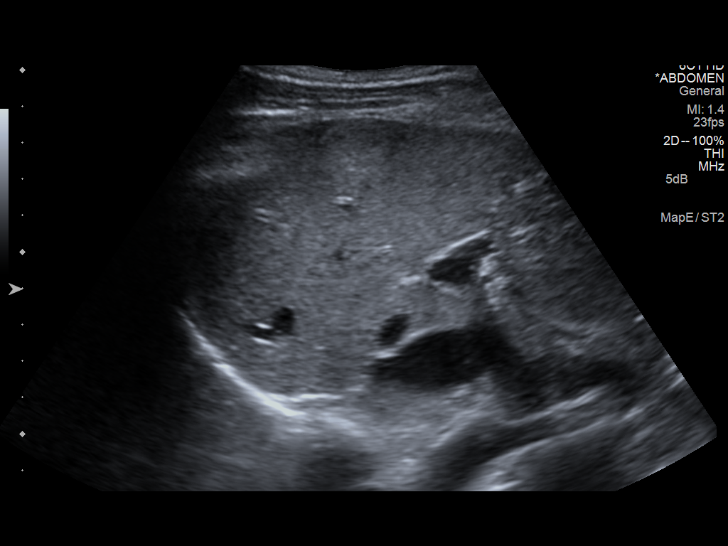

[13 of 25 positions shown; findings below may reference images not displayed]

FINDINGS: Gallbladder:

Surgically absent

Common bile duct:

Diameter: 2 mm

Liver:

In the subcapsular right hepatic lobe anteriorly there is a
hyperechoic mass measuring 18 mm. There is prominent vessels leading
to the mass. This correlates with hemangioma seen on chest CT
12/17/2011. No associated subcapsular hypoechoic fluid. There is no
other focal liver abnormality. The liver echogenicity is within
normal limits and there is antegrade flow in the imaged portal
venous system.

IVC:

No abnormality visualized.

Pancreas:

Visualized portion unremarkable.

Spleen:

Size and appearance within normal limits.

Right Kidney:

Length: 11 cm. Echogenicity within normal limits. No mass or
hydronephrosis visualized.

Left Kidney:

Length: 11 cm. Echogenicity within normal limits. No mass or
hydronephrosis visualized. Prominent band of cortical tissue in the
medullary region could reflect duplication of the urinary collecting
system.

Abdominal aorta:

No aneurysm visualized.

Other findings:

None.
IMPRESSION: 1. No acute sonographic findings. The gallbladder is surgically
absent.
2. 18 mm right hepatic hemangioma.

## 2016-03-04 IMAGING — CT CT ABD-PELV W/O CM
1 of 2 series · 15 of 32 positions shown, 19 images · non-contrast
Comparison: 05/16/2014 chest radiograph

CLINICAL DATA: 28-year-old female with AML, abdominal pain and
fever.

EXAM:
CT ABDOMEN AND PELVIS WITHOUT CONTRAST
TECHNIQUE: Multidetector CT imaging of the abdomen and pelvis was performed
following the standard protocol without IV contrast.

[Series 2: abd/pel w/o · axial · non-contrast · 0.58mm/px · z∈[-336,+44]mm · 15 of 84 slices shown, 19 images]
[im 4/84  soft-tissue]
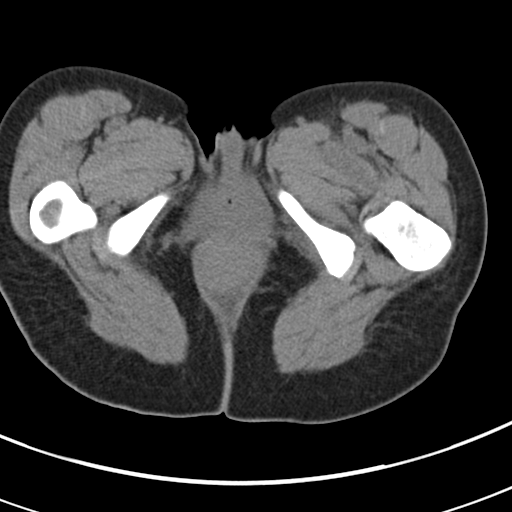
[im 4/84  bone]
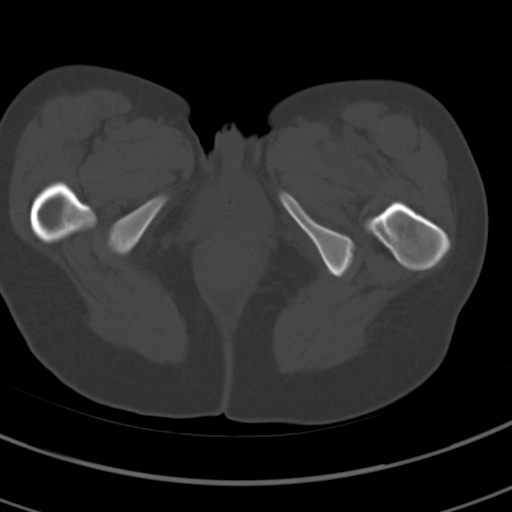
[im 10/84  soft-tissue]
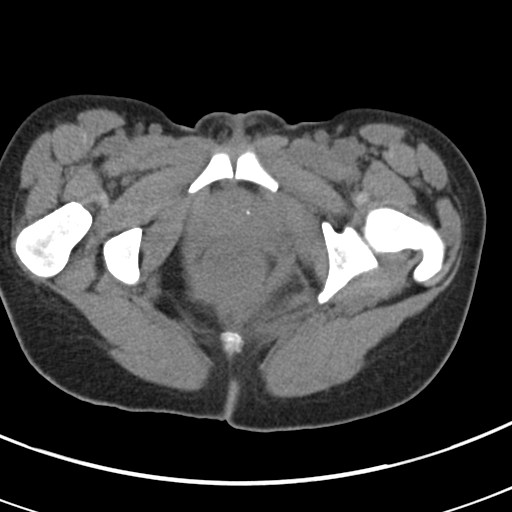
[im 17/84  soft-tissue]
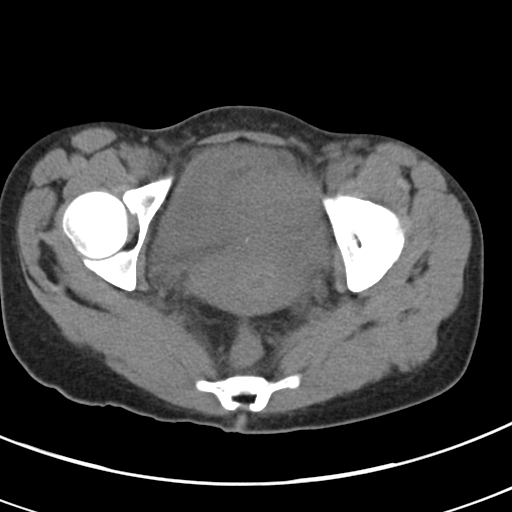
[im 24/84  soft-tissue]
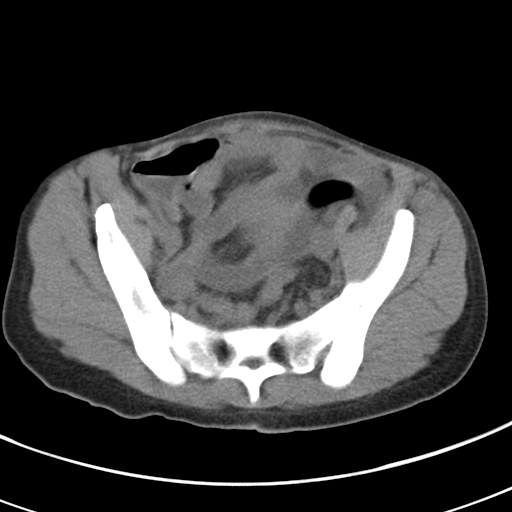
[im 30/84  soft-tissue]
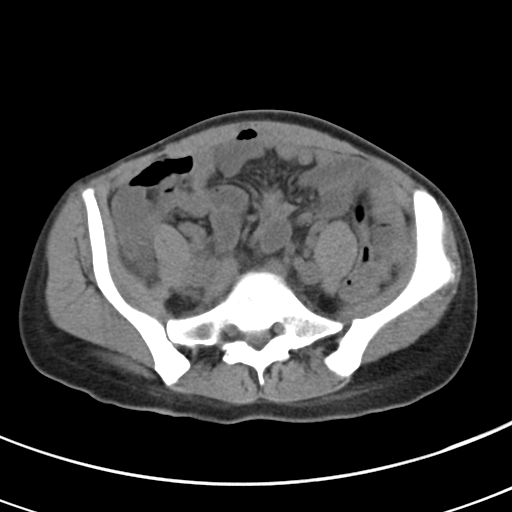
[im 37/84  soft-tissue]
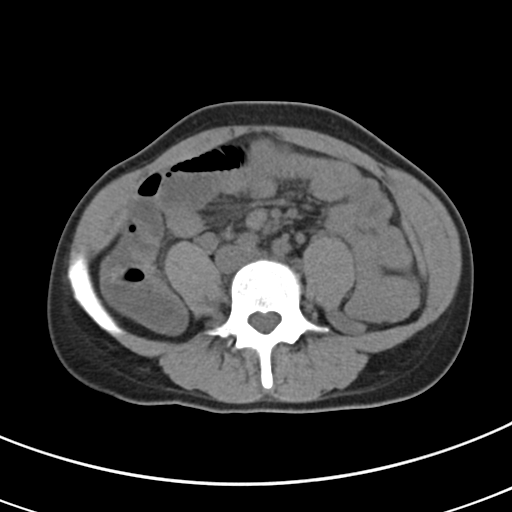
[im 44/84  soft-tissue]
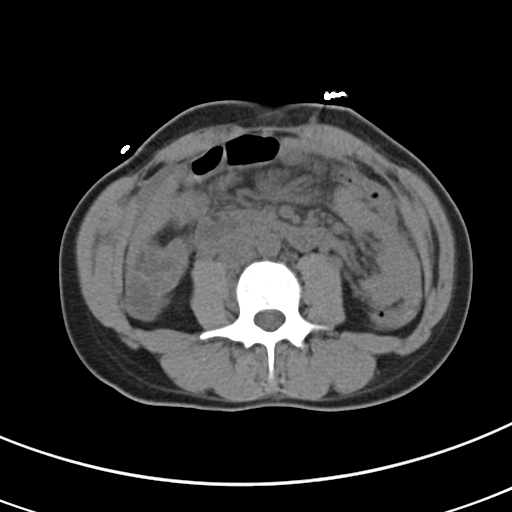
[im 47/84  soft-tissue]
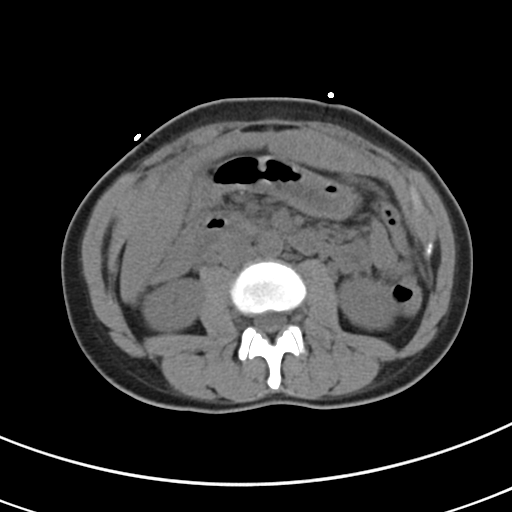
[im 54/84  soft-tissue]
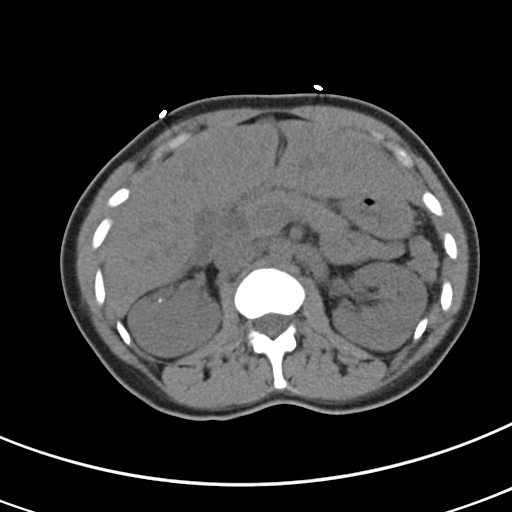
[im 54/84  bone]
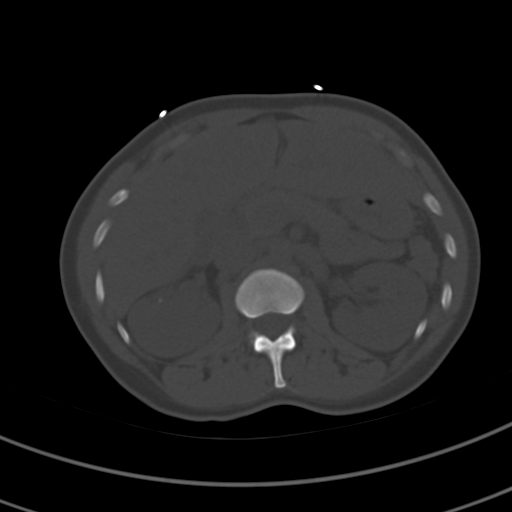
[im 60/84  soft-tissue]
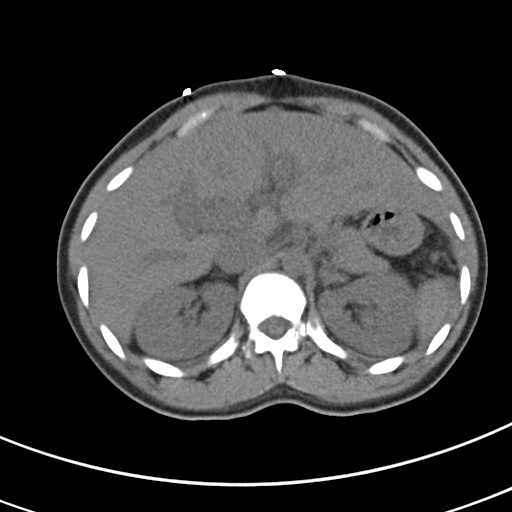
[im 67/84  soft-tissue]
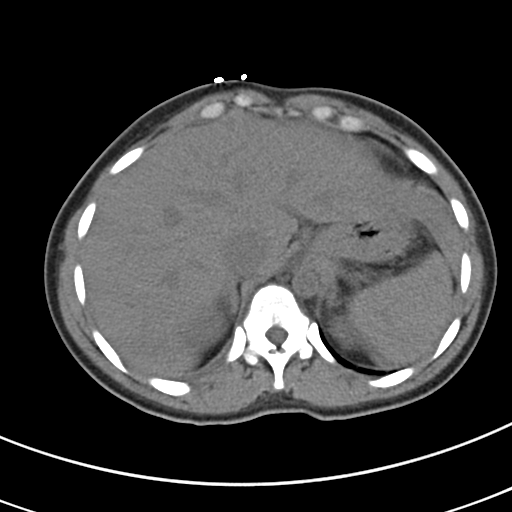
[im 70/84  lung]
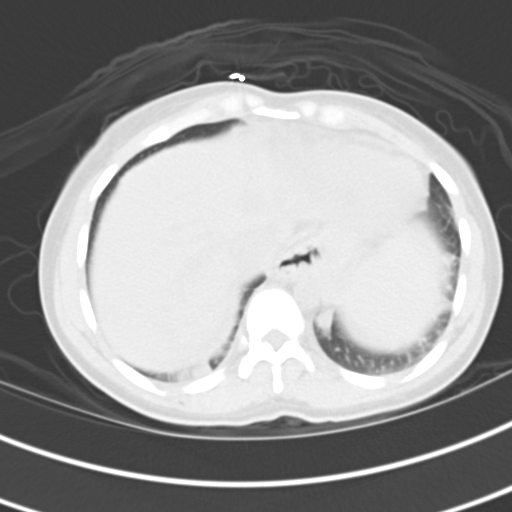
[im 74/84  soft-tissue]
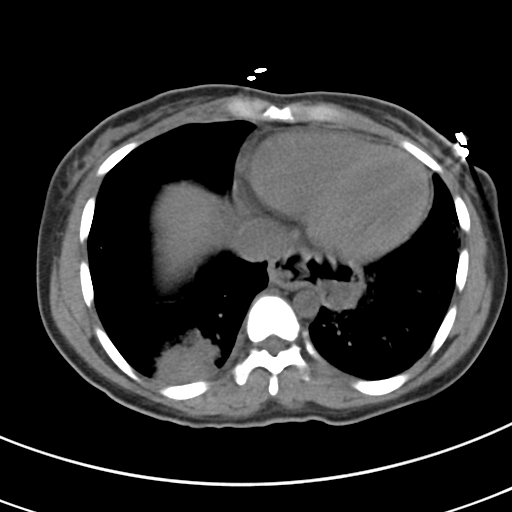
[im 74/84  lung]
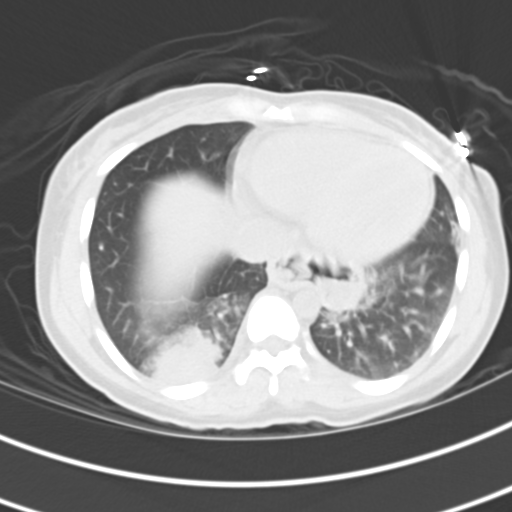
[im 77/84  lung]
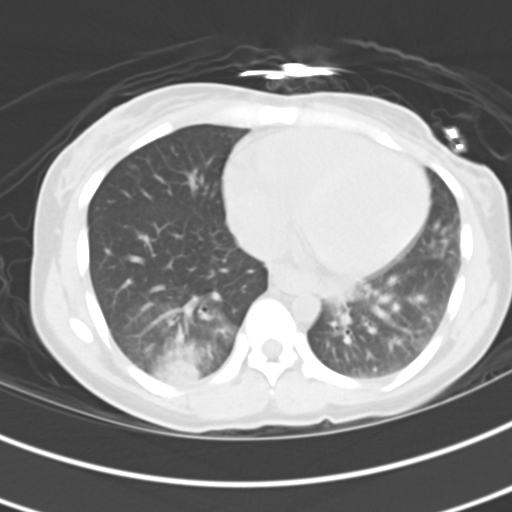
[im 80/84  soft-tissue]
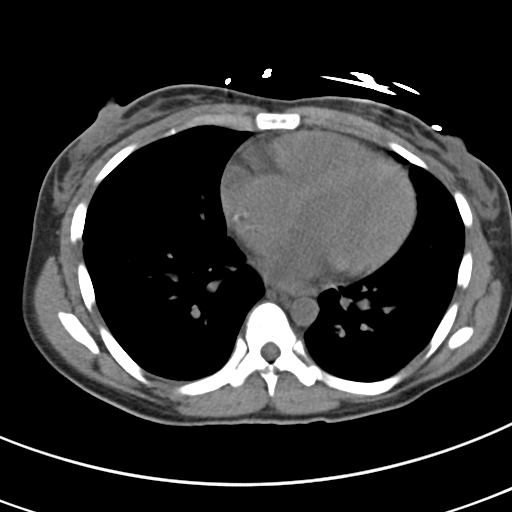
[im 80/84  lung]
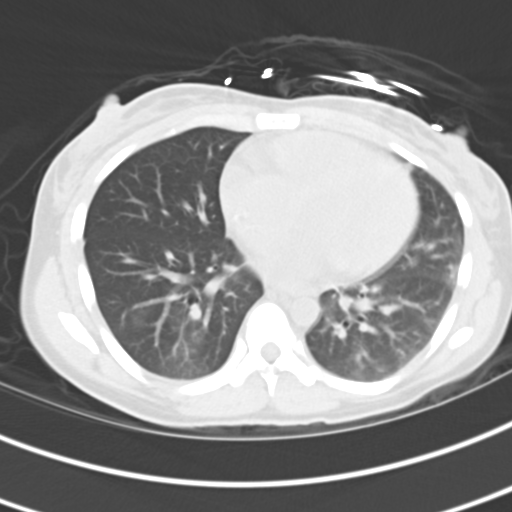

[15 of 32 positions shown; findings below may reference images not displayed]

FINDINGS: Posterior right lower lobe consolidation is compatible with
pneumonia.

Patchy airspace opacities within the left lower lobe are identified
also compatible with pneumonia.

A moderate hiatal hernia is noted.

The liver, spleen, pancreas, and adrenal glands are unremarkable.

Nonobstructing bilateral renal calculi are identified including 4 mm
and 2 mm right lower pole calculi and 2 punctate left lower pole
calculi. There is no evidence of hydronephrosis.

The patient is status post cholecystectomy.

Please note that parenchymal abnormalities may be missed without
intravenous contrast.

A tiny amount of ascites within the upper abdomen noted.

There is no evidence of biliary dilatation, abdominal aortic
aneurysm or enlarged lymph nodes.

The bowel and bladder are unremarkable. There is no evidence of
bowel obstruction or pneumoperitoneum.

No acute or suspicious bony abnormalities are identified.
IMPRESSION: Bilateral lower lobe pneumonia.

Tiny amount of upper abdominal ascites.

Nonobstructing bilateral renal calculi.

Moderate hiatal hernia.
